# Patient Record
Sex: Female | Born: 1940 | Race: Black or African American | Hispanic: No | Marital: Single | Smoking: Never smoker
Health system: Southern US, Community
[De-identification: ages and names within clinical notes are randomized; demographics above are authoritative.]

## PROBLEM LIST (undated history)

## (undated) DIAGNOSIS — E119 Type 2 diabetes mellitus without complications: Secondary | ICD-10-CM

## (undated) DIAGNOSIS — C50919 Malignant neoplasm of unspecified site of unspecified female breast: Secondary | ICD-10-CM

## (undated) DIAGNOSIS — N189 Chronic kidney disease, unspecified: Secondary | ICD-10-CM

## (undated) DIAGNOSIS — I10 Essential (primary) hypertension: Secondary | ICD-10-CM

## (undated) DIAGNOSIS — M810 Age-related osteoporosis without current pathological fracture: Secondary | ICD-10-CM

## (undated) DIAGNOSIS — E039 Hypothyroidism, unspecified: Secondary | ICD-10-CM

## (undated) DIAGNOSIS — F329 Major depressive disorder, single episode, unspecified: Secondary | ICD-10-CM

## (undated) DIAGNOSIS — F32A Depression, unspecified: Secondary | ICD-10-CM

## (undated) DIAGNOSIS — E7800 Pure hypercholesterolemia, unspecified: Secondary | ICD-10-CM

## (undated) DIAGNOSIS — F0390 Unspecified dementia without behavioral disturbance: Secondary | ICD-10-CM

## (undated) HISTORY — PX: BREAST SURGERY: SHX581

## (undated) HISTORY — PX: THYROIDECTOMY: SHX17

---

## 1971-08-19 HISTORY — PX: MASTECTOMY: SHX3

## 2011-08-19 HISTORY — PX: BREAST BIOPSY: SHX20

## 2014-07-03 ENCOUNTER — Emergency Department: Payer: Self-pay | Admitting: Emergency Medicine

## 2014-07-05 ENCOUNTER — Emergency Department: Payer: Self-pay | Admitting: Student

## 2014-11-09 ENCOUNTER — Observation Stay: Payer: Self-pay | Admitting: Internal Medicine

## 2014-11-09 LAB — URINALYSIS, COMPLETE
BLOOD: NEGATIVE
Bilirubin,UR: NEGATIVE
Glucose,UR: NEGATIVE mg/dL (ref 0–75)
Hyaline Cast: 62
KETONE: NEGATIVE
Nitrite: NEGATIVE
PH: 6 (ref 4.5–8.0)
Protein: NEGATIVE
RBC,UR: 3 /HPF (ref 0–5)
Specific Gravity: 1.013 (ref 1.003–1.030)
Squamous Epithelial: 40
WBC UR: 13 /HPF (ref 0–5)

## 2014-11-09 LAB — CBC WITH DIFFERENTIAL/PLATELET
BASOS ABS: 0.1 10*3/uL (ref 0.0–0.1)
BASOS PCT: 1.1 %
Basophil #: 0.1 10*3/uL (ref 0.0–0.1)
Basophil %: 1.3 %
EOS PCT: 1.5 %
EOS PCT: 1.9 %
Eosinophil #: 0.1 10*3/uL (ref 0.0–0.7)
Eosinophil #: 0.2 10*3/uL (ref 0.0–0.7)
HCT: 40.3 % (ref 35.0–47.0)
HCT: 43.8 % (ref 35.0–47.0)
HGB: 12.7 g/dL (ref 12.0–16.0)
HGB: 13.8 g/dL (ref 12.0–16.0)
LYMPHS ABS: 2.8 10*3/uL (ref 1.0–3.6)
Lymphocyte #: 1.9 10*3/uL (ref 1.0–3.6)
Lymphocyte %: 27.3 %
Lymphocyte %: 30.9 %
MCH: 27.3 pg (ref 26.0–34.0)
MCH: 27.6 pg (ref 26.0–34.0)
MCHC: 31.5 g/dL — AB (ref 32.0–36.0)
MCHC: 31.5 g/dL — ABNORMAL LOW (ref 32.0–36.0)
MCV: 87 fL (ref 80–100)
MCV: 88 fL (ref 80–100)
MONOS PCT: 8.2 %
Monocyte #: 0.6 x10 3/mm (ref 0.2–0.9)
Monocyte #: 0.9 x10 3/mm (ref 0.2–0.9)
Monocyte %: 10.2 %
NEUTROS ABS: 4.2 10*3/uL (ref 1.4–6.5)
Neutrophil #: 5.1 10*3/uL (ref 1.4–6.5)
Neutrophil %: 55.9 %
Neutrophil %: 61.7 %
PLATELETS: 233 10*3/uL (ref 150–440)
Platelet: 203 10*3/uL (ref 150–440)
RBC: 4.61 10*6/uL (ref 3.80–5.20)
RBC: 5.06 10*6/uL (ref 3.80–5.20)
RDW: 14 % (ref 11.5–14.5)
RDW: 14.2 % (ref 11.5–14.5)
WBC: 6.8 10*3/uL (ref 3.6–11.0)
WBC: 9.1 10*3/uL (ref 3.6–11.0)

## 2014-11-09 LAB — HEMOGLOBIN: HGB: 12.9 g/dL (ref 12.0–16.0)

## 2014-11-09 LAB — COMPREHENSIVE METABOLIC PANEL
ALBUMIN: 3.7 g/dL
ALK PHOS: 105 U/L
ANION GAP: 11 (ref 7–16)
AST: 26 U/L
BUN: 18 mg/dL
Bilirubin,Total: 0.8 mg/dL
CALCIUM: 9.2 mg/dL
Chloride: 101 mmol/L
Co2: 26 mmol/L
Creatinine: 1.17 mg/dL — ABNORMAL HIGH
GFR CALC AF AMER: 54 — AB
GFR CALC NON AF AMER: 47 — AB
Glucose: 203 mg/dL — ABNORMAL HIGH
Potassium: 2.8 mmol/L — ABNORMAL LOW
SGPT (ALT): 18 U/L
SODIUM: 138 mmol/L
Total Protein: 7.1 g/dL

## 2014-11-09 LAB — TROPONIN I
Troponin-I: <0.03 ng/mL
Troponin-I: <0.03 ng/mL
Troponin-I: <0.03 ng/mL

## 2014-11-09 LAB — BASIC METABOLIC PANEL
ANION GAP: 8 (ref 7–16)
BUN: 12 mg/dL
CALCIUM: 8.5 mg/dL — AB
CO2: 26 mmol/L
Chloride: 107 mmol/L
Creatinine: 0.87 mg/dL
EGFR (African American): >60
Glucose: 129 mg/dL — ABNORMAL HIGH
Potassium: 3.5 mmol/L
Sodium: 141 mmol/L

## 2014-11-09 LAB — MAGNESIUM: Magnesium: 1.8 mg/dL

## 2014-11-09 LAB — PROTIME-INR
INR: 1
Prothrombin Time: 13.7 secs

## 2014-11-09 LAB — HEMATOCRIT: HCT: 41.3 % (ref 35.0–47.0)

## 2014-11-09 LAB — LIPASE, BLOOD: LIPASE: 22 U/L

## 2014-11-10 LAB — CBC WITH DIFFERENTIAL/PLATELET
Basophil #: 0 10*3/uL (ref 0.0–0.1)
Basophil %: 0.4 %
Eosinophil #: 0.1 10*3/uL (ref 0.0–0.7)
Eosinophil %: 3 %
HCT: 39.8 % (ref 35.0–47.0)
HGB: 12.5 g/dL (ref 12.0–16.0)
Lymphocyte #: 2.3 10*3/uL (ref 1.0–3.6)
Lymphocyte %: 47.1 %
MCH: 27.7 pg (ref 26.0–34.0)
MCHC: 31.5 g/dL — ABNORMAL LOW (ref 32.0–36.0)
MCV: 88 fL (ref 80–100)
Monocyte #: 0.7 x10 3/mm (ref 0.2–0.9)
Monocyte %: 13.7 %
Neutrophil #: 1.8 10*3/uL (ref 1.4–6.5)
Neutrophil %: 35.8 %
Platelet: 203 10*3/uL (ref 150–440)
RBC: 4.53 10*6/uL (ref 3.80–5.20)
RDW: 13.8 % (ref 11.5–14.5)
WBC: 4.9 10*3/uL (ref 3.6–11.0)

## 2014-11-10 LAB — BASIC METABOLIC PANEL
Anion Gap: 7 (ref 7–16)
BUN: 8 mg/dL
Calcium, Total: 8.6 mg/dL — ABNORMAL LOW
Chloride: 107 mmol/L
Co2: 26 mmol/L
Creatinine: 0.8 mg/dL
EGFR (African American): >60
EGFR (Non-African Amer.): >60
Glucose: 131 mg/dL — ABNORMAL HIGH
Potassium: 3.5 mmol/L
Sodium: 140 mmol/L

## 2014-11-10 LAB — HEMATOCRIT
HCT: 40.8 % (ref 35.0–47.0)
HCT: 40.5 %

## 2014-11-10 LAB — HEMOGLOBIN A1C: Hemoglobin A1C: 6.6 % — ABNORMAL HIGH

## 2014-11-10 LAB — TSH: Thyroid Stimulating Horm: 2.562 u[IU]/mL

## 2014-11-10 LAB — HEMOGLOBIN
HGB: 12.9 g/dL (ref 12.0–16.0)
HGB: 12.9 g/dL (ref 12.0–16.0)

## 2014-11-10 LAB — MAGNESIUM: Magnesium: 1.8 mg/dL

## 2014-11-11 LAB — CBC WITH DIFFERENTIAL/PLATELET
Basophil #: 0 10*3/uL (ref 0.0–0.1)
Basophil %: 0.6 %
Eosinophil #: 0.2 10*3/uL (ref 0.0–0.7)
Eosinophil %: 2.9 %
HCT: 42.2 % (ref 35.0–47.0)
HGB: 13.4 g/dL (ref 12.0–16.0)
LYMPHS PCT: 40.9 %
Lymphocyte #: 2.3 10*3/uL (ref 1.0–3.6)
MCH: 27.5 pg (ref 26.0–34.0)
MCHC: 31.7 g/dL — ABNORMAL LOW (ref 32.0–36.0)
MCV: 87 fL (ref 80–100)
MONOS PCT: 12.1 %
Monocyte #: 0.7 x10 3/mm (ref 0.2–0.9)
NEUTROS ABS: 2.5 10*3/uL (ref 1.4–6.5)
Neutrophil %: 43.5 %
Platelet: 229 10*3/uL (ref 150–440)
RBC: 4.87 10*6/uL (ref 3.80–5.20)
RDW: 13.6 % (ref 11.5–14.5)
WBC: 5.6 10*3/uL (ref 3.6–11.0)

## 2014-11-11 LAB — URINE CULTURE

## 2014-12-17 NOTE — Consult Note (Signed)
PATIENT NAME:  Melanie Young, WALKO MR#:  [id number] DATE OF BIRTH:  1941-02-24  DATE OF CONSULTATION:  11/09/2014  REFERRING PHYSICIAN:   CONSULTING PHYSICIAN:  Theodore Demark, NP  REASON FOR CONSULTATION: GI consult ordered by Dr. Marcille Blanco for pain with bright red blood per rectum with syncope.   HISTORY OF PRESENT ILLNESS: I appreciate consult for this 74 year old [place] American woman for evaluation of BRPR with syncope. History is significant for colon polyps. States last colonoscopy was a month ago, breast cancer with mastectomy, diabetes, hypertension, hyperlipidemia and angina.  Reports she was in her usual state of health until last night, felt the urge to defecate, associated with nausea and an unusual feeling in her lower abdomen; went to the commode and became dizzy and hot and then evidently passed out. The patient reports her son said there was some bright red blood in and around the toilet in small amounts. She states she has had 2 episodes of bright red blood on the toilet paper today in small amounts, but none in the actual commode.  She states that nausea is resolved.  She is feeling very well in her abdomen. Does report a history of constipation and straining, but denies all other GI complaints. Hemoglobin is normal and has not dropped.  She is dynamically stable, reports her last colonoscopy was done a month ago at [facility] and there was some benign polyps removed.  REVIEW OF SYSTEMS:   Ten systems reviewed. Significant for intermittent angina, dizzy spells, loss of balance, falls, urinary frequency. Otherwise unremarkable as noted above.   PAST MEDICAL HISTORY: B-12 deficiency, type 2 diabetes, hypothyroidism, hypertension, osteopenia, breast cancer, right mastectomy, thyroidectomy, colonoscopy.   SOCIAL HISTORY: Lives with older son, no alcohol, illicits or tobacco.   FAMILY HISTORY: Maternal aunt and 3 cousins with breast cancer, son who deceased at age 55 from CAD-heart disease.  There is no family history of colorectal cancer, colon polyps, liver disease or ulcers.   HOME MEDICATIONS: ASA 81 mg p.o. daily, Wellbutrin 150 mg ER b.i.d., calcium with D b.i.d., Lasix 40 mg once a day, Lantus 4 units at bedtime, levothyroxine 125 mg 1 tablets p.o. daily, losartan 50 mg once a day, B12 at 1000 mcg once a day.   ALLERGIES: NKDA.   DIAGNOSTIC DATA:  CT of abdomen and pelvis with no acute abnormalities.  There was some diverticulosis but no diverticulitis, no bowel wall inflammation.  CT head had no acute intracranial abnormalities.   LABORATORY DATA: Most recent labs: Glucose 129, BUN 12, creatinine 0.87, sodium 141, potassium 3.5, GFR greater than 60, calcium 8.5, lipase 22, magnesium 1.8. Liver panel unremarkable. Troponin x 3 negative.  WBC 9.1, hemoglobin 12.7, hematocrit 40.3, platelets 203,000, red cells are normocytic.  PT 13.7, INR 1.0.  Imaging studies as above.    PHYSICAL EXAMINATION:   VITAL SIGNS:  Temperature 98.1, pulse 68, respiratory rate 20, blood pressure 103/74, oxygen saturation 97% on room air.  GENERAL: Well-appearing elderly woman in no acute distress.  HEENT: Normocephalic, atraumatic. Conjunctivae pink. Sclerae clear.  NECK: No JVD, lymphadenopathy.  CHEST: Respirations eupneic.  LUNGS:  Clear.  CARDIAC: S1, S2, RRR. No MRG. No appreciable edema.  ABDOMEN: Obese abdomen. Bowel sounds x 4. Soft, nondistended, nontender. No guarding, rigidity, peritoneal signs, hepatosplenomegaly or masses.  RECTAL: Several external hemorrhoids on the anal ring. There is a small oblong mass inside the dentate line consistent with an internal hemorrhoid. There is also a rough linear area at approximately 12 o'clock  that feels consistent with a fissure. Her stool is brownish, pink, heme-positive.  SKIN: Warm, dry, pink. No erythema, lesion or rash.  EXTREMITIES: MAEW x 4. Strength 5/5. No clubbing or cyanosis.  NEUROLOGIC: Alert, oriented x 3. Cranial nerves II through  XII intact.  PSYCHIATRIC: Pleasant, calm, cooperative. Good memory mostly, good judgment.   IMPRESSION AND PLAN: Rectal bleeding. Do not think bleeding precipitated syncope, somewhat unusual presentation for anal outlet, but also was not consistent with ischemic episode colitis. Doubt diverticular bleed.  We would recommend treating with Anusol suppositories b.i.d. x 2 weeks, Citrucel once a day due to her history of constipation and straining, and consider flexible sigmoidoscopy if significant bleeding episode.  Would like her colonoscopy records.   Thank you very much for this consult. These services were provided by Stephens November, MSN, [facility], in collaboration with Loistine Simas, M.D. with whom I discussed this patient in full.     ____________________________ Theodore Demark, NP chl:DT D: 11/10/2014 12:12:45 ET T: 11/10/2014 12:28:52 ET JOB#: [id number]  cc: Theodore Demark, NP, <Dictator> [facility] SIGNED 11/17/2014 16:07

## 2014-12-17 NOTE — Consult Note (Signed)
Brief Consult Note: Diagnosis: syncope/brpr.   Patient was seen by consultant.   Consult note dictated.   Comments: Appreciate consult for 74 y/o [place] Amercian woman for evaluation of brpr with syncope. Hx significant for colon polyps (last cspy 31m ago), breast cancer w/mastectomy, DM, htn, HL, angina. Reports she was in her usual states of health until late last night- felt the urge to defecate, associated with nausea and an unsual feeling in her lower abdomen. Went to the commode, became dizzy and hot then evidently passed out. Pt reports her son said there was some bright red blood in/around the toilet in small amt. States she has had 2 episodes of bright red blood on the toilet paper today in small amounts, but none in the toilet.States nausea resolved, and she feels very well in his abdomen. Does report a history of constipation and straining, but denies all other GI complaints. Her hgb is normal and has not dropped since arrival. She is hemodynamically stable. Colonoscopy last done at [facility] ago and states there were some benign polyps removed. On exam, abdomen benign, soft with good bowel sounds. Rectal exam with external hemorrhoids, internal oblong soft area consistent with internal hemorrhoidi, and linear rough area about 12o clock consitent with a fissure. Stool brownish pink , heme pos Impression/plan: Rectal bleeding. Do not think bleeding precipitated syncope. Somewhat unusual presentation for anal outlet, but not consistent with ischemic episode/colitis. Doubt diveritcular. Would recommend treateing with anusol suppositories bid x 2w, citrucel once daily due to her hsitory of constipation/straining, and consider flex sig if significant bleeding episode. Would like cspy records.  Electronic Signatures: Stephens November H (NP)  (Signed 24-Mar-16 19:48)  Authored: Brief Consult Note   Last Updated: 24-Mar-16 19:48 by Theodore Demark (NP)

## 2014-12-17 NOTE — Consult Note (Signed)
Chief Complaint:  Subjective/Chief Complaint The patient has had no further bleeding. Had a colonoscopy last month at [facility]. No abd. pain.   VITAL SIGNS/ANCILLARY NOTES: **Vital Signs.:   25-Mar-16 12:31  Vital Signs Type Recheck  Systolic BP Systolic BP 007  Diastolic BP (mmHg) Diastolic BP (mmHg) 622  Mean BP 133  BP Source  if not from Vital Sign Device manual   Brief Assessment:  GEN well developed, well nourished, no acute distress   Respiratory normal resp effort  no use of accessory muscles   Additional Physical Exam Alert and orientated times 3   Lab Results: Thyroid:  25-Mar-16 04:39   Thyroid Stimulating Hormone 2.562 (0.350-4.500 NOTE: New Reference Range  10/24/14)  Routine Chem:  25-Mar-16 04:39   Magnesium, Serum 1.8 (1.7-2.4 THERAPEUTIC RANGE: 4-7 mg/dL TOXIC: > 10 mg/dL  ----------------------- NOTE: New Reference Range  10/24/14)  Glucose, Serum  131 (65-99 NOTE: New Reference Range  10/24/14)  BUN 8 (6-20 NOTE: New Reference Range  10/24/14)  Creatinine (comp) 0.80 (0.44-1.00 NOTE: New Reference Range  10/24/14)  Sodium, Serum 140 (135-145 NOTE: New Reference Range  10/24/14)  Potassium, Serum 3.5 (3.5-5.1 NOTE: New Reference Range  10/24/14)  Chloride, Serum 107 (101-111 NOTE: New Reference Range  10/24/14)  CO2, Serum 26 (22-32 NOTE: New Reference Range  10/24/14)  Calcium (Total), Serum  8.6 (8.9-10.3 NOTE: New Reference Range  10/24/14)  Anion Gap 7  eGFR (African American) >60  eGFR (Non-African American) >60 (eGFR values <70m/min/1.73 m2 may be an indication of chronic kidney disease (CKD). Calculated eGFR is useful in patients with stable renal function. The eGFR calculation will not be reliable in acutely ill patients when serum creatinine is changing rapidly. It is not useful in patients on dialysis. The eGFR calculation may not be applicable to patients at the low and high extremes of body sizes, pregnant women, and  vegetarians.)  Hemoglobin A1c ([facility])  6.6 (4.0-6.0 NOTE: New Reference Range  10/24/14)  Routine Hem:  25-Mar-16 01:15   Hemoglobin (CBC) 12.9 (Result(s) reported on 10 Nov 2014 at 01:30AM.)  Hematocrit (CBC) 40.8 (Result(s) reported on 10 Nov 2014 at 01:30AM.)    04:39   Hemoglobin (CBC) 12.5  Hematocrit (CBC) 39.8  WBC (CBC) 4.9  RBC (CBC) 4.53  Platelet Count (CBC) 203  MCV 88  MCH 27.7  MCHC  31.5  RDW 13.8  Neutrophil % 35.8  Lymphocyte % 47.1  Monocyte % 13.7  Eosinophil % 3.0  Basophil % 0.4  Neutrophil # 1.8  Lymphocyte # 2.3  Monocyte # 0.7  Eosinophil # 0.1  Basophil # 0.0 (Result(s) reported on 10 Nov 2014 at 06:38AM.)    08:10   Hemoglobin (CBC) 12.9 (Result(s) reported on 10 Nov 2014 at 08:32AM.)  Hematocrit (CBC) 40.5 (Result(s) reported on 10 Nov 2014 at 08:32AM.)   Assessment/Plan:  Assessment/Plan:  Assessment Lower GI bleeding.   Plan No bleeding today. Follow Hb and symptoms for furhter GI bleeding.   Electronic Signatures: WLucilla Lame(MD)  (Signed 25-Mar-16 15:27)  Authored: Chief Complaint, VITAL SIGNS/ANCILLARY NOTES, Brief Assessment, Lab Results, Assessment/Plan   Last Updated: 25-Mar-16 15:27 by WLucilla Lame(MD)

## 2014-12-17 NOTE — Consult Note (Signed)
Chief Complaint:  Subjective/Chief Complaint patietn seen for rectal bleeding.   Acute onset yesterday, some lower abdominal discomfort at that time.   Rectal exam this afternoon with scant effluent, not grossly bloddy though heme positive, finding of hemorrhoids.  Patient had colonoscopy one month ago at [facility] with apparent finding of colon polyps.  Positive h/o intermittant constipation, occasion rectal bleeding/on toilet paper.   HGB stable.  Hemodynamically stable.  Probable hemorrhoidal bleeding or anal fissure.  Less likely diverticular (smaller amount and no gross blood in vault currently) or ischemia (no abdominal pain at current). Recommend treating for hemorrhoidal bleeding with HC rectal cream.  If bleeding is recurrent, would consider flexible sigmoidoscopy.    Dr Allen Norris covering over the weekend, call if needed.   VITAL SIGNS/ANCILLARY NOTES: **Vital Signs.:   24-Mar-16 20:22  Vital Signs Type Routine  Temperature Temperature (F) 98.1  Celsius 36.7  Pulse Pulse 74  Respirations Respirations 18  Systolic BP Systolic BP 419  Diastolic BP (mmHg) Diastolic BP (mmHg) 86  Mean BP 106  Pulse Ox % Pulse Ox % 97  Pulse Ox Activity Level  At rest  Oxygen Delivery Room Air/ 21 %   Brief Assessment:  Cardiac Regular   Respiratory clear BS   Gastrointestinal details normal Soft  Nontender  Nondistended  Bowel sounds normal   Lab Results:  Routine UA:  24-Mar-16 23:57   Color (UA) Yellow  Clarity (UA) Cloudy  Glucose (UA) Negative  Bilirubin (UA) Negative  Ketones (UA) Negative  Specific Gravity (UA) 1.013  Blood (UA) Negative  pH (UA) 6.0  Protein (UA) Negative  Nitrite (UA) Negative  Leukocyte Esterase (UA) 3+ (Result(s) reported on 09 Nov 2014 at 12:58AM.)  RBC (UA) 3 /HPF  WBC (UA) 13 /HPF  Bacteria (UA) 1+  Epithelial Cells (UA) 40 /HPF  Hyaline Cast (UA) 62 /LPF (Result(s) reported on 09 Nov 2014 at 12:58AM.)  Routine Hem:  23-Mar-16 23:57   Hemoglobin (CBC) 13.8   24-Mar-16 05:38   Hemoglobin (CBC) 12.7    15:52   Hemoglobin (CBC) 12.9 (Result(s) reported on 09 Nov 2014 at 04:28PM.)   Radiology Results: CT:    24-Mar-16 02:00, CT Abdomen and Pelvis With Contrast  CT Abdomen and Pelvis With Contrast   REASON FOR EXAM:    (1) painless GI bleed; (2) painless GI bleed  COMMENTS:       PROCEDURE: CT  - CT ABDOMEN / PELVIS  W  - Nov 09 2014  2:00AM     CLINICAL DATA:  Acute onset of syncope and fall. Diaphoresis and  lightheadedness. Initial encounter.    EXAM:  CT ABDOMEN AND PELVIS WITH CONTRAST    TECHNIQUE:  Multidetector CT imaging of the abdomen and pelvis was performed  using the standard protocol following bolus administration of  intravenous contrast.  CONTRAST:  100 mL of Omnipaque 300 IVcontrast    COMPARISON:  None.    FINDINGS:  Mild bronchiectasis is noted at the lung bases, with minimal  underlying scarring.    The liver and spleen are unremarkable in appearance. Scattered  stones are seen dependently within the gallbladder. Thegallbladder  is relatively decompressed and otherwise unremarkable. The pancreas  and adrenal glands are unremarkable.    The kidneys are unremarkable in appearance. There is no evidence of  hydronephrosis. No renal or ureteral stones are seen. No perinephric  stranding is appreciated.    No free fluid is identified. The small bowel is unremarkable in  appearance. The  stomach is within normal limits. No acute vascular  abnormalities are seen. Scattered calcification is seen along the  abdominal aorta and its branches.    The appendix is normal in caliber and contains air, without evidence  of appendicitis. Scattered diverticulosis is noted along the distal  descending and proximal sigmoid colon, without evidence of  diverticulitis.    The bladder is moderately distended and grossly unremarkable. The  uterus is unremarkable in appearance. The ovaries are relatively  symmetric. No suspicious  adnexal masses are seen. No inguinal  lymphadenopathy is seen.    No acute osseous abnormalities areidentified. Multilevel vacuum  phenomenon is noted along the lumbar spine, with associated endplate  sclerotic change.     IMPRESSION:  1. No acute abnormality seen to explain the patient's symptoms.  2. Cholelithiasis; gallbladder otherwise unremarkable.  3. Scattered calcification along the abdominal aorta and its  branches.  4. Scattered diverticulosis along the distal descending and proximal  sigmoid colon, without evidence of diverticulitis.  5. Mild degenerative change along the lumbar spine.  6. Mild bronchiectasis at the lung bases, with minimal underlying  scarring.      Electronically Signed    By: Garald Balding M.D.    On: 11/09/2014 02:59         Verified By: JEFFREY . Radene Knee, M.D.,   Assessment/Plan:  Assessment/Plan:  Assessment as above   Electronic Signatures: Loistine Simas (MD)  (Signed 24-Mar-16 23:25)  Authored: Chief Complaint, VITAL SIGNS/ANCILLARY NOTES, Brief Assessment, Lab Results, Radiology Results, Assessment/Plan   Last Updated: 24-Mar-16 23:25 by Loistine Simas (MD)

## 2014-12-17 NOTE — H&P (Signed)
PATIENT NAME:  Melanie Young, STARLIN MR#:  [id number] DATE OF BIRTH:  22-Apr-1941  DATE OF ADMISSION:  11/09/2014  REFERRING PHYSICIAN: Valli Glance. Owens Shark, MD   PRIMARY CARE PHYSICIAN: [facility] physicians.   ADMISSION DIAGNOSIS: Bright red bleeding per rectum and syncope.   HISTORY OF PRESENT ILLNESS: This is a 74 year old African American female who presents to the Emergency Department via EMS following an episode of syncope and bright red bleeding per rectum. The patient recognizes that she passed out with while having a bowel movement. The patient fell off the commode, but did not hit her head. She denies any pain of her skull. In the Emergency Department, the patient's head CT was negative and her hemoglobin and hematocrit were within normal limits. She states that she recalls feeling nauseous and became very diaphoretic prior to passing out. She denies that this has happened before; however, she admits that she has been falling frequently over the last year. These falls are generally due to lack of balance, as the patient states there is no prodrome or loss of consciousness prior to falling. She states that she feels as if she is staggering prior to falling, but she is able to catch herself before sustaining any injury. Due to her constellation of symptoms, the Emergency Department called for admission.   REVIEW OF SYSTEMS:  CONSTITUTIONAL: The patient denies fever or weakness.  EYES: Denies blurred vision, but admits to dry eyes as well as mucus production from both eyes.  EARS, NOSE AND THROAT: Denies tinnitus or sore throat.  RESPIRATORY: Denies shortness of breath or cough.  CARDIOVASCULAR: Denies chest pain, palpitations, orthopnea, or paroxysmal nocturnal dyspnea.  GASTROINTESTINAL: Admits to nausea that is currently resolved, but denies vomiting, diarrhea, or abdominal pain.  GENITOURINARY: The patient denies dysuria, but admits to increased frequency, hesitancy and urgency of urination.   ENDOCRINE: Denies polydipsia, but admits to polyuria.  HEMATOLOGIC AND LYMPHATIC: Denies easy bruising or bleeding.  INTEGUMENTARY: Denies rashes or lesions.  MUSCULOSKELETAL: Denies myalgias, but admits to arthralgias. This is a chronic complaint.  NEUROLOGIC: The patient states that she feels as if she is falling to one side prior to these episodes of imbalance. She does not fall one side all of the time, but can fall in any direction. She denies headaches or dysarthria associated with these symptoms.  PSYCHIATRIC: The patient has a history of depression, but is not suicidal or homicidal at this time.   PAST MEDICAL HISTORY: As reported by the patient, she has B12 deficiency; diabetes, type 2; hypothyroidism; hypertension; and osteopenia or some related calcium metabolism disorder, history of breast cancer.    PAST SURGICAL HISTORY: Right mastectomy, thyroidectomy, colposcopy with excision.   SOCIAL HISTORY: The patient lives with her older son. She does not smoke, drink, or do any drugs.   FAMILY HISTORY: The patient has a son that died of coronary artery disease at 72 years of age. She also has had a maternal aunt as well as 3 first cousins, all of whom are sisters, who had breast cancer.   MEDICATIONS:  1.  Aspirin 81 mg 1 tablet p.o. daily.  2.  Bupropion 150 mg extended release 1 tablet p.o. b.i.d.  3.  Calcium 600 + 800 international units of vitamin D 1 tablet p.o. b.i.d.  4.  Furosemide 40 mg 1 tablet p.o. daily.  5.  Lantus 4 units subcutaneously at bedtime.  6.  Levothyroxine 125 mcg 1 tablet p.o. daily.  7.  Losartan 50 mg 1  tablet p.o. daily.  8.  Vitamin B12 at 1000 mcg 1 tablet p.o. daily.   ALLERGIES: No known drug allergies.   PERTINENT LABORATORY RESULTS AND RADIOGRAPHIC FINDINGS: Serum glucose is 203, BUN 18, creatinine 1.17, serum sodium is 138, potassium is 2.8, chloride is 101, bicarbonate 26, calcium is 9.2. Lipase is 22, serum albumin is 3.7, alkaline phosphatase  105, AST 26, ALT 18. White blood cell count 6.8, hemoglobin is 13.8, hematocrit is 43.8, platelet count 233,000, MCV is 87. Urinalysis shows 3+ leukocyte esterase with 3 red blood cells per high-power field and 13 white blood cells per high-power field. There is 1+ bacteria seen. CT of the head without contrast shows no acute intracranial abnormalities. CT of the abdomen and pelvis with contrast shows no acute abnormality. There is some cholelithiasis as well as some scattered calcification along the abdominal aorta. There is some scattered diverticulosis along the distal descending and proximal sigmoid colon without evidence of diverticulitis. There is mild degenerative change along the lumbar spine and mild bronchiectasis at the lung bases with minimal underlying scarring.   PHYSICAL EXAMINATION:  VITAL SIGNS: Temperature is 98.2, pulse 69, respirations 16, blood pressure is 113/68, pulse oximetry is 100% on room air.  GENERAL: The patient is alert and oriented x 3, in no apparent distress.  HEENT: Normocephalic, atraumatic. Pupils equal, round, and reactive to light and accommodation. Extraocular movements are intact. Mucous membranes are moist. The sclerae of the patient's eyes are inflamed and deep inner canthi filled with serous green mucus.  NECK: Trachea is midline. No adenopathy. There is a well-healed scar from thyroidectomy.  CHEST: Symmetric. There is also a well-healed mastectomy scar on the right.  CARDIOVASCULAR: Regular rate and rhythm. Normal S1, S2. No rubs, clicks, or murmurs appreciated.  LUNGS: Clear to auscultation bilaterally. Normal effort and excursion.  ABDOMEN: Positive bowel sounds. Soft, nontender, nondistended. No hepatosplenomegaly.  GENITOURINARY: Deferred.  MUSCULOSKELETAL: The patient moves all 4 extremities equally. There is 5/5 strength in upper and lower extremities bilaterally.  SKIN: Warm and dry. There are no rashes or lesions.  EXTREMITIES: No clubbing or  cyanosis. There is trace pretibial edema.  NEUROLOGIC: Cranial nerves II-XII are grossly intact. There is no nystagmus.   PSYCHIATRIC: Mood is normal. Affect is congruent. The patient has excellent judgment and insight into her medical condition, although her memory of specific procedures is not exactly clear.   ASSESSMENT AND PLAN: This is a 74 year old female admitted for bright red bleeding per rectum and syncope.  1.  Bright red bleeding per rectum. The patient's bleeding has been painless. She has no diverticulitis seen on CAT scan. She is hemodynamically stable, as she is not orthostatic at this time. Her hemoglobin and hematocrit are within normal limits, although we will recheck them in the morning to verify that this is accurate and not a result obtained prior to equilibration of her blood volume. The patient notes that she has had colonoscopies on a frequent schedule. Her last colonoscopy was 1 month ago. The findings of each of these procedures is not completely clear. Differential diagnosis includes internal hemorrhoids as well as hamartomas or diseases such as Peutz Jeghers syndrome, which could lead to some vitamin deficiencies as well. I have consulted gastroenterology and made the patient n.p.o. I have also held her aspirin.  2.  Syncope. This is likely vasovagal given the circumstances surrounding the episode. The patient's head CT is negative. I have placed her on telemetry. We will track her cardiac enzymes  due to the associated symptoms of nausea and diaphoresis; however, I think that acute coronary syndrome is unlikely.  3.  Urinary tract infection. The patient is symptomatic. We will start ceftriaxone.  4.  Acute kidney injury. The patient's baseline is unknown. I will gently hydrate her and replete her potassium.  5.  Diabetes, type 2. The patient report reports high insulin sensitivity. She is only on 4 units of Lantus at home. Now that the patient is n.p.o., I will hold her insulin  for now but we may restart sliding scale insulin at a low dose once the patient has been placed on a diet.  6.  Hypertension, currently controlled. We can continue losartan, although we may need to hold it prior to the procedure tomorrow morning.  7.  Hypothyroidism. I will check a thyroid stimulating hormone and continue Synthroid.  8.  Osteopenia. The patient is unclear on the specifics of her disease process, as she states that it is mostly in her lower legs, which is not common for osteoporosis, but certainly a possible finding on a bone scan. I am going to check vitamin D and intact PTH and calcium levels.  9.  Deep vein thrombosis prophylaxis. Sequential compression devices.  10.  Gastrointestinal prophylaxis. Intravenous pantoprazole.   CODE STATUS: The patient is a full code.   TIME SPENT ON ADMISSION ORDERS AND PATIENT CARE: Approximately 45 minutes.    ____________________________ Norva Riffle. Marcille Blanco, MD msd:bm D: 11/09/2014 05:05:26 ET T: 11/09/2014 05:46:10 ET JOB#: [id number]  cc: Norva Riffle. Marcille Blanco, MD, <Dictator> Norva Riffle Obryan Radu MD ELECTRONICALLY SIGNED 11/10/2014 0:39

## 2014-12-17 NOTE — Consult Note (Signed)
Chief Complaint:  Subjective/Chief Complaint Patient without any further bleeding. Hb stable. Recent colonoscopy at [facility].   VITAL SIGNS/ANCILLARY NOTES: **Vital Signs.:   26-Mar-16 07:48  Vital Signs Type Routine  Temperature Temperature (F) 97.3  Celsius 36.2  Temperature Source oral  Pulse Pulse 60  Respirations Respirations 18  Systolic BP Systolic BP 174  Diastolic BP (mmHg) Diastolic BP (mmHg) 84  Mean BP 100  Pulse Ox % Pulse Ox % 97  Pulse Ox Activity Level  At rest  Oxygen Delivery Room Air/ 21 %  Telemetry pattern Cardiac Rhythm Bradycardia   Brief Assessment:  GEN well developed, well nourished, no acute distress   Respiratory normal resp effort  no use of accessory muscles   Additional Physical Exam Alert and orientated times 3   Lab Results: Routine Hem:  26-Mar-16 03:25   WBC (CBC) 5.6  RBC (CBC) 4.87  Hemoglobin (CBC) 13.4  Hematocrit (CBC) 42.2  Platelet Count (CBC) 229  MCV 87  MCH 27.5  MCHC  31.7  RDW 13.6  Neutrophil % 43.5  Lymphocyte % 40.9  Monocyte % 12.1  Eosinophil % 2.9  Basophil % 0.6  Neutrophil # 2.5  Lymphocyte # 2.3  Monocyte # 0.7  Eosinophil # 0.2  Basophil # 0.0 (Result(s) reported on 11 Nov 2014 at 04:46AM.)   Assessment/Plan:  Assessment/Plan:  Assessment Lower GI bleed likely Hemorrhoids.   Plan Nothing further to do from a GI point of view.  Will sign off now.  If rebleeding then surgery should be consulted.   Electronic Signatures: Lucilla Lame (MD)  (Signed 26-Mar-16 10:18)  Authored: Chief Complaint, VITAL SIGNS/ANCILLARY NOTES, Brief Assessment, Lab Results, Assessment/Plan   Last Updated: 26-Mar-16 10:18 by Lucilla Lame (MD)

## 2014-12-17 NOTE — Discharge Summary (Signed)
PATIENT NAME:  Melanie Young, Melanie Young MR#:  [id number] DATE OF BIRTH:  [birth year]  DATE OF ADMISSION:  11/09/2014 DATE OF DISCHARGE:  11/11/2014  DISCHARGE DIAGNOSES:  1.  Lower gastrointestinal bleed secondary to hemorrhoids.  2.  Syncope.  3.  Acute kidney injury.   PROCEDURES: None.   CONSULTATIONS: Gastroenterology.   CODE STATUS: Full code.   DISCHARGE MEDICATIONS: Lantus 40 units subcutaneously once daily at bedtime, bupropion 150 mg 1 tablet p.o. 2 times a day, aspirin 81 mg 1 tablet p.o. once daily, levothyroxine 125 mcg p.o. once daily in the a.m., vitamin B12 at 1000 mcg p.o. once daily, furosemide 40 mg p.o. once daily, calcium with vitamin D 1 tablet p.o. 2 times a day, losartan 50 mg p.o. 2 times a day, metoprolol tartrate 25 mg 1 tablet p.o. 2 times a day, hydrocortisone rectal cream applied rectally 2 times a day at needed for constipation or hemorrhoids.   DIET: Low-sodium, low-fat, carbohydrate control ADA (diet consistency regular consistency).   ACTIVITY: As tolerated.   FOLLOWUP APPOINTMENTS: With primary care physician in 1 week, GI  in 2 to 4 weeks as-needed basis if a GI bleed recurs.   BRIEF HISTORY AND PHYSICAL AND HOSPITAL COURSE: The patient is a 74 year old African American female who came into the ED with a chief complaint of bright red blood per rectum and a syncopal spell. The patient has reported that she passed out while having a bowel movement. She fell off the commode, but did not hit her head. Please review history and physical for details. CT of the head is negative. Hemoglobin and hematocrit were within normal limits.   HOSPITAL COURSE BASED ON THE PROBLEM:  1.  Bright red blood per rectum/lower GI bleed. This was a painless bleed. CAT scan of the abdomen was done which has revealed no diverticulitis. The patient was given IV fluids. She was made n.p.o. Hemoglobin and hematocrit were monitored closely. She had colonoscopy done one month ago. The report was not  available at this time.  Gastroenterology consult was placed. After discussing with gastroenterology, the patient was started on a clear liquid diet. She was evaluated by gastroenterology, Dr. Gustavo Lah, who has recommended Anusol cream on as needed basis for hemorrhoids and reconsult him if needed if the patient's lower GI bleed recurs. Her hemoglobin was monitored closely which was stable and in the normal range. The patient denies any abdominal pain. Condition being stable, decision is made to discharge the patient home.  2.  Syncope, most likely vasovagal as she had the syncopal episode while having a bowel movement and fell off the commode. Cardiac enzymes were negative. No other episodes were noticed.  3.  Urinary tract infection. Cultures were sent. IV Rocephin was given. Urine culture has revealed mixed bacteria which is a contaminant. IV ceftriaxone was discontinued. The patient does not need any more antibiotics.  4.  Acute kidney injury, probably from dehydration. IV fluids were given. Renal function is back to normal. We will try to avoid nephrotoxins. No other recommendations at this time.  5.  Diabetes mellitus type 2. The patient is to continue her home dose Lantus and follow up with primary care physician. Continue sliding scale insulin.  6.  Hypertension. Blood pressure was elevated. Losartan dose was increased. Metoprolol is added to the regimen. Today, blood pressure is stable. PCP to follow up and up-titrate medications as-needed basis.  7.  Hypothyroidism. Continue Synthroid.   The patient is discharged home under satisfactory condition. Hospital course  was uneventful.   PHYSICAL EXAMINATION: VITAL SIGNS: Today, temperature 97.3, pulse 60, respirations 18, blood pressure 132/84, pulse oximetry 97%.  GENERAL APPEARANCE: Not in acute distress, morbidly obese.  HEENT: Normocephalic, atraumatic. Pupils are equally reacting to light and accommodation. No scleral icterus. No conjunctival  injection. No sinus tenderness. Moist mucous membranes.  NECK: Supple. No JVD. No thyromegaly. Range of motion is intact.  LUNGS: Clear to auscultation bilaterally. No accessory muscle use and no anterior chest wall tenderness on palpation.  CARDIAC: S1, S2 normal. Regular rate and rhythm.  GASTROINTESTINAL: Soft. Bowel sounds are positive in all 4 quadrants, obese, nontender. No masses felt.  NEUROLOGIC: Awake, alert, oriented x 3. Cranial nerves II through XII are grossly intact. Motor and sensory are intact. Reflexes are 2+.  EXTREMITIES: No edema, no cyanosis, no clubbing.  PSYCHIATRIC: Normal mood and affect.   LABORATORY AND IMAGING STUDIES: Troponin less than 0.03 x 2. TSH is normal. CBC normal. Urine culture, mixed bacteria. Urinalysis is positive for leukocyte esterase 3+, nitrite negative. BMP showed glucose 131, the rest of the BMP is normal. Hemoglobin A1c 6.6. CAT scan of the abdomen and pelvis with contrast has revealed no acute abnormality, cholelithiasis, gallbladder otherwise unremarkable, scattered calcification along the abdominal aorta and it branches. Scattered diverticulosis. Mild DJD in the lumbar spine. Mild bronchiectasis at the lung bases. CT head: No acute abnormality.   Plan of care was discussed in detail with the patient. She verbalized understanding of the plan.   TOTAL TIME SPENT ON THE DISCHARGE: 45 minutes.     ____________________________ Nicholes Mango, MD ag:at D: 11/11/2014 17:05:00 ET T: 11/11/2014 17:45:36 ET JOB#: [id number]  cc: Nicholes Mango, MD, <Dictator> Nicholes Mango MD ELECTRONICALLY SIGNED 11/22/2014 12:17

## 2015-07-19 ENCOUNTER — Encounter: Payer: Self-pay | Admitting: Emergency Medicine

## 2015-07-19 ENCOUNTER — Emergency Department
Admission: EM | Admit: 2015-07-19 | Discharge: 2015-07-19 | Disposition: A | Discharge status: Home / Self Care | Payer: Medicare Other | Attending: Emergency Medicine | Admitting: Emergency Medicine

## 2015-07-19 ENCOUNTER — Emergency Department: Payer: Medicare Other

## 2015-07-19 DIAGNOSIS — R252 Cramp and spasm: Secondary | ICD-10-CM | POA: Insufficient documentation

## 2015-07-19 DIAGNOSIS — I10 Essential (primary) hypertension: Secondary | ICD-10-CM | POA: Diagnosis not present

## 2015-07-19 DIAGNOSIS — E119 Type 2 diabetes mellitus without complications: Secondary | ICD-10-CM | POA: Insufficient documentation

## 2015-07-19 DIAGNOSIS — R0981 Nasal congestion: Secondary | ICD-10-CM | POA: Diagnosis not present

## 2015-07-19 DIAGNOSIS — M791 Myalgia, unspecified site: Secondary | ICD-10-CM

## 2015-07-19 DIAGNOSIS — R05 Cough: Secondary | ICD-10-CM | POA: Insufficient documentation

## 2015-07-19 HISTORY — DX: Major depressive disorder, single episode, unspecified: F32.9

## 2015-07-19 HISTORY — DX: Pure hypercholesterolemia, unspecified: E78.00

## 2015-07-19 HISTORY — DX: Essential (primary) hypertension: I10

## 2015-07-19 HISTORY — DX: Depression, unspecified: F32.A

## 2015-07-19 HISTORY — DX: Type 2 diabetes mellitus without complications: E11.9

## 2015-07-19 HISTORY — DX: Age-related osteoporosis without current pathological fracture: M81.0

## 2015-07-19 LAB — CBC WITH DIFFERENTIAL/PLATELET
BASOS PCT: 1 %
Basophils Absolute: 0 10*3/uL (ref 0–0.1)
Eosinophils Absolute: 0.1 10*3/uL (ref 0–0.7)
Eosinophils Relative: 2 %
HCT: 46.5 % (ref 35.0–47.0)
Hemoglobin: 14.6 g/dL (ref 12.0–16.0)
LYMPHS ABS: 2.8 10*3/uL (ref 1.0–3.6)
Lymphocytes Relative: 44 %
MCH: 27.3 pg (ref 26.0–34.0)
MCHC: 31.4 g/dL — ABNORMAL LOW (ref 32.0–36.0)
MCV: 87.1 fL (ref 80.0–100.0)
MONO ABS: 0.6 10*3/uL (ref 0.2–0.9)
MONOS PCT: 10 %
NEUTROS ABS: 2.7 10*3/uL (ref 1.4–6.5)
Neutrophils Relative %: 43 %
Platelets: 196 10*3/uL (ref 150–440)
RBC: 5.33 MIL/uL — ABNORMAL HIGH (ref 3.80–5.20)
RDW: 14 % (ref 11.5–14.5)
WBC: 6.3 10*3/uL (ref 3.6–11.0)

## 2015-07-19 LAB — COMPREHENSIVE METABOLIC PANEL
ALBUMIN: 4 g/dL (ref 3.5–5.0)
ALT: 26 U/L (ref 14–54)
ANION GAP: 7 (ref 5–15)
AST: 33 U/L (ref 15–41)
Alkaline Phosphatase: 115 U/L (ref 38–126)
BUN: 10 mg/dL (ref 6–20)
CALCIUM: 9.3 mg/dL (ref 8.9–10.3)
CO2: 27 mmol/L (ref 22–32)
CREATININE: 0.82 mg/dL (ref 0.44–1.00)
Chloride: 107 mmol/L (ref 101–111)
GFR calc Af Amer: >60 mL/min (ref >60)
GFR calc non Af Amer: >60 mL/min (ref >60)
GLUCOSE: 152 mg/dL — AB (ref 65–99)
Potassium: 3.8 mmol/L (ref 3.5–5.1)
SODIUM: 141 mmol/L (ref 135–145)
Total Bilirubin: 1 mg/dL (ref 0.3–1.2)
Total Protein: 8.1 g/dL (ref 6.5–8.1)

## 2015-07-19 LAB — GLUCOSE, CAPILLARY: Glucose-Capillary: 120 mg/dL — ABNORMAL HIGH (ref 65–99)

## 2015-07-19 LAB — URINALYSIS COMPLETE WITH MICROSCOPIC (ARMC ONLY)
BACTERIA UA: NONE SEEN
Bilirubin Urine: NEGATIVE
GLUCOSE, UA: NEGATIVE mg/dL
Hgb urine dipstick: NEGATIVE
Ketones, ur: NEGATIVE mg/dL
LEUKOCYTES UA: NEGATIVE
Nitrite: NEGATIVE
PH: 7 (ref 5.0–8.0)
Protein, ur: NEGATIVE mg/dL
Specific Gravity, Urine: 1.021 (ref 1.005–1.030)

## 2015-07-19 MED ORDER — DIAZEPAM 5 MG/ML IJ SOLN
5.0000 mg | Freq: Once | INTRAMUSCULAR | Status: AC
  Administered 2015-07-19: 5 mg via INTRAVENOUS
  Filled 2015-07-19: qty 2

## 2015-07-19 MED ORDER — CYCLOBENZAPRINE HCL 10 MG PO TABS: 10.0000 mg | ORAL_TABLET | Freq: Three times a day (TID) | ORAL | Status: DC | PRN

## 2015-07-19 MED ORDER — KETOROLAC TROMETHAMINE 30 MG/ML IJ SOLN
30.0000 mg | Freq: Once | INTRAMUSCULAR | Status: AC
  Administered 2015-07-19: 30 mg via INTRAVENOUS

## 2015-07-19 MED ORDER — MELOXICAM 15 MG PO TABS: 15.0000 mg | ORAL_TABLET | Freq: Every day | ORAL | Status: DC

## 2015-07-19 MED ORDER — KETOROLAC TROMETHAMINE 30 MG/ML IJ SOLN
60.0000 mg | Freq: Once | INTRAMUSCULAR | Status: DC
  Filled 2015-07-19: qty 2

## 2015-07-19 NOTE — Discharge Instructions (Signed)
Muscle Pain, Adult  Muscle pain (myalgia) may be caused by many things, including:  · Overuse or muscle strain, especially if you are not in shape. This is the most common cause of muscle pain.  · Injury.  · Bruises.  · Viruses, such as the flu.  · Infectious diseases.  · Fibromyalgia, which is a chronic condition that causes muscle tenderness, fatigue, and headache.  · Autoimmune diseases, including lupus.  · Certain drugs, including ACE inhibitors and statins.  Muscle pain may be mild or severe. In most cases, the pain lasts only a short time and goes away without treatment. To diagnose the cause of your muscle pain, your health care provider will take your medical history. This means he or she will ask you when your muscle pain began and what has been happening. If you have not had muscle pain for very long, your health care provider may want to wait before doing much testing. If your muscle pain has lasted a long time, your health care provider may want to run tests right away. If your health care provider thinks your muscle pain may be caused by illness, you may need to have additional tests to rule out certain conditions.   Treatment for muscle pain depends on the cause. Home care is often enough to relieve muscle pain. Your health care provider may also prescribe anti-inflammatory medicine.  HOME CARE INSTRUCTIONS  Watch your condition for any changes. The following actions may help to lessen any discomfort you are feeling:  · Only take over-the-counter or prescription medicines as directed by your health care provider.  · Apply ice to the sore muscle:    Put ice in a plastic bag.    Place a towel between your skin and the bag.    Leave the ice on for 15-20 minutes, 3-4 times a day.  · You may alternate applying hot and cold packs to the muscle as directed by your health care provider.  · If overuse is causing your muscle pain, slow down your activities until the pain goes away.    Remember that it is normal  to feel some muscle pain after starting a workout program. Muscles that have not been used often will be sore at first.    Do regular, gentle exercises if you are not usually active.    Warm up before exercising to lower your risk of muscle pain.  · Do not continue working out if the pain is very bad. Bad pain could mean you have injured a muscle.  SEEK MEDICAL CARE IF:  · Your muscle pain gets worse, and medicines do not help.  · You have muscle pain that lasts longer than 3 days.  · You have a rash or fever along with muscle pain.  · You have muscle pain after a tick bite.  · You have muscle pain while working out, even though you are in good physical condition.  · You have redness, soreness, or swelling along with muscle pain.  · You have muscle pain after starting a new medicine or changing the dose of a medicine.  SEEK IMMEDIATE MEDICAL CARE IF:  · You have trouble breathing.  · You have trouble swallowing.  · You have muscle pain along with a stiff neck, fever, and vomiting.  · You have severe muscle weakness or cannot move part of your body.  MAKE SURE YOU:   · Understand these instructions.  · Will watch your condition.  · Will get   help right away if you are not doing well or get worse.     This information is not intended to replace advice given to you by your health care provider. Make sure you discuss any questions you have with your health care provider.     Document Released: 06/26/2006 Document Revised: 08/25/2014 Document Reviewed: 05/31/2013  Elsevier Interactive Patient Education ©2016 [employer]

## 2015-07-19 NOTE — ED Notes (Signed)
Patient presents to the ED with body aches, particularly leg pain.  Patient states, "I really just don't feel good."  Patient states she has been hurting for a week.  Patient is also concerned about her blood sugar although levels have been wnl for a diabetic today.  Patient denies headache, denies blurry vision.  Reports congestion.

## 2015-07-19 NOTE — ED Provider Notes (Signed)
[facility] Emergency Department Provider Note  ____________________________________________  Time seen: Approximately 12:22 PM  I have reviewed the triage vital signs and the nursing notes.   HISTORY  Chief Complaint Generalized Body Aches   HPI Melanie Young is a 74 y.o. female who presents to the emergency room with complaints of vague body aches bilateral leg pain with muscle contractions and spasms." I really just don't feel good". Patient states that she is a diabetic concern about her blood sugar blood pressure. Denies having a headache or blurry vision. Reports cough and congestion.   Past Medical History  Diagnosis Date  . Diabetes mellitus without complication ([place])   . Hypertension   . Hypercholesteremia   . Osteoporosis   . Depression   . Cancer ([place])     There are no active problems to display for this patient.   Past Surgical History  Procedure Laterality Date  . Breast surgery      Current Outpatient Rx  Name  Route  Sig  Dispense  Refill  . cyclobenzaprine (FLEXERIL) 10 MG tablet   Oral   Take 1 tablet (10 mg total) by mouth every 8 (eight) hours as needed for muscle spasms.   30 tablet   1   . meloxicam (MOBIC) 15 MG tablet   Oral   Take 1 tablet (15 mg total) by mouth daily.   30 tablet   0     Allergies Review of patient's allergies indicates no known allergies.  No family history on file.  Social History Social History  Substance Use Topics  . Smoking status: Never Smoker   . Smokeless tobacco: None  . Alcohol Use: No    Review of Systems Constitutional: No fever/chills positive for fatigue and weakness. Vital signs reviewed blood pressure noted to be elevated. Eyes: No visual changes. ENT: No sore throat. Cardiovascular: Denies chest pain. Respiratory: Denies shortness of breath. Positive for cough Gastrointestinal: No abdominal pain.  No nausea, no vomiting.  No diarrhea.  No  constipation. Genitourinary: Negative for dysuria. Musculoskeletal: Negative for back pain. Positive for severe bilateral muscle cramps and pains Skin: Negative for rash. Neurological: Negative for headaches, focal weakness or numbness.  10-point ROS otherwise negative.  ____________________________________________   PHYSICAL EXAM:  VITAL SIGNS: ED Triage Vitals  Enc Vitals Group     BP 07/19/15 1202 180/108 mmHg     Pulse Rate 07/19/15 1202 90     Resp 07/19/15 1202 20     Temp 07/19/15 1202 98.3 F (36.8 C)     Temp Source 07/19/15 1202 Oral     SpO2 07/19/15 1202 99 %     Weight 07/19/15 1202 215 lb (97.523 kg)     Height 07/19/15 1202 5\' 4"  (1.626 m)     Head Cir --      Peak Flow --      Pain Score 07/19/15 1222 7     Pain Loc --      Pain Edu? --      Excl. in [place]? --     Constitutional: Alert and oriented. Well appearing and in no acute distress. Eyes: Conjunctivae are normal. PERRL. EOMI. Head: Atraumatic. Nose: No congestion/rhinnorhea. Mouth/Throat: Mucous membranes are moist.  Oropharynx non-erythematous. Neck: No stridor.   Cardiovascular: Normal rate, regular rhythm. Grossly normal heart sounds.  Good peripheral circulation. Respiratory: Normal respiratory effort.  No retractions. Lungs CTAB. Gastrointestinal: Soft and nontender. No distention. No abdominal bruits. No CVA tenderness. Musculoskeletal: No lower  extremity tenderness nor edema.  No joint effusions. Neurologic:  Normal speech and language. No gross focal neurologic deficits are appreciated. No gait instability. Skin:  Skin is warm, dry and intact. No rash noted. Psychiatric: Mood and affect are normal. Speech and behavior are normal.  ____________________________________________   LABS (all labs ordered are listed, but only abnormal results are displayed)  Labs Reviewed  GLUCOSE, CAPILLARY - Abnormal; Notable for the following:    Glucose-Capillary 120 (*)    All other components within  normal limits  COMPREHENSIVE METABOLIC PANEL - Abnormal; Notable for the following:    Glucose, Bld 152 (*)    All other components within normal limits  CBC WITH DIFFERENTIAL/PLATELET - Abnormal; Notable for the following:    RBC 5.33 (*)    MCHC 31.4 (*)    All other components within normal limits  URINALYSIS COMPLETEWITH MICROSCOPIC ([facility] ONLY) - Abnormal; Notable for the following:    Color, Urine YELLOW (*)    APPearance CLEAR (*)    Squamous Epithelial / LPF 0-5 (*)    All other components within normal limits    RADIOLOGY  Negative ____________________________________________   PROCEDURES  Procedure(s) performed: None  Critical Care performed: No  ____________________________________________   INITIAL IMPRESSION / ASSESSMENT AND PLAN / ED COURSE  Pertinent labs & imaging results that were available during my care of the patient were reviewed by me and considered in my medical decision making (see chart for details).  Acute nonspecific myofascial strain with bilateral leg cramps. Electrolytes all within normal limits sugar elevated slightly at 153. Patient given thiamine 5 mg and Toradol 30 mg IV while in the ED with relief. Discharged home with Flexeril and Mobic daily. Patient to follow up with PCP. Patient voices no other emergency medical complaints at this time and will return to the ER with any worsening symptomology. ____________________________________________   FINAL CLINICAL IMPRESSION(S) / ED DIAGNOSES  Final diagnoses:  Myalgia      Arlyss Repress, PA-C 07/19/15 1420  Wandra Arthurs, MD 07/19/15 [phone]

## 2015-10-19 ENCOUNTER — Encounter: Payer: Self-pay | Admitting: Emergency Medicine

## 2015-10-19 ENCOUNTER — Emergency Department
Admission: EM | Admit: 2015-10-19 | Discharge: 2015-10-19 | Disposition: A | Discharge status: Home / Self Care | Payer: Medicare Other | Attending: Emergency Medicine | Admitting: Emergency Medicine

## 2015-10-19 ENCOUNTER — Emergency Department: Payer: Medicare Other

## 2015-10-19 DIAGNOSIS — Z791 Long term (current) use of non-steroidal anti-inflammatories (NSAID): Secondary | ICD-10-CM | POA: Diagnosis not present

## 2015-10-19 DIAGNOSIS — I10 Essential (primary) hypertension: Secondary | ICD-10-CM | POA: Insufficient documentation

## 2015-10-19 DIAGNOSIS — Y9289 Other specified places as the place of occurrence of the external cause: Secondary | ICD-10-CM | POA: Diagnosis not present

## 2015-10-19 DIAGNOSIS — S4991XA Unspecified injury of right shoulder and upper arm, initial encounter: Secondary | ICD-10-CM | POA: Diagnosis present

## 2015-10-19 DIAGNOSIS — W1839XA Other fall on same level, initial encounter: Secondary | ICD-10-CM | POA: Insufficient documentation

## 2015-10-19 DIAGNOSIS — Y998 Other external cause status: Secondary | ICD-10-CM | POA: Insufficient documentation

## 2015-10-19 DIAGNOSIS — S42294A Other nondisplaced fracture of upper end of right humerus, initial encounter for closed fracture: Secondary | ICD-10-CM | POA: Insufficient documentation

## 2015-10-19 DIAGNOSIS — Y9389 Activity, other specified: Secondary | ICD-10-CM | POA: Diagnosis not present

## 2015-10-19 DIAGNOSIS — S42301A Unspecified fracture of shaft of humerus, right arm, initial encounter for closed fracture: Secondary | ICD-10-CM

## 2015-10-19 DIAGNOSIS — E119 Type 2 diabetes mellitus without complications: Secondary | ICD-10-CM | POA: Insufficient documentation

## 2015-10-19 MED ORDER — HYDROCODONE-ACETAMINOPHEN 5-325 MG PO TABS: 1.0000 | ORAL_TABLET | Freq: Four times a day (QID) | ORAL | Status: AC | PRN

## 2015-10-19 MED ORDER — HYDROCODONE-ACETAMINOPHEN 5-325 MG PO TABS
2.0000 | ORAL_TABLET | Freq: Once | ORAL | Status: AC
  Administered 2015-10-19: 2 via ORAL
  Filled 2015-10-19: qty 2

## 2015-10-19 NOTE — ED Provider Notes (Signed)
[facility] Emergency Department Provider Note  ____________________________________________  Time seen: Approximately 12:14 PM  I have reviewed the triage vital signs and the nursing notes.   HISTORY  Chief Complaint Fall    HPI Melanie Young is a 75 y.o. female who presents with shoulder pain secondary to a fall this morning. The patient reports that she was opening her door, lost balance and fell on her right side. States this occurs if she does not eat first thing in the morning due to her diabetes. She had not eaten when the fall occurred. She describes pain down her right arm but is primarily complaining of right shoulder pain. The pain is sharp and exacerbated by movement. She denies radiation of pain and ranks the severity of her pain to be a 10/10. Use of an ice pack and hot water bottle did not improve her pain. She did hit her head when she fell, but she denies LOC, lightheadedness, dizziness, nausea, vomiting, changes in vision or altered mental status after the fall. The only other symptom she noticed was chills.   Past Medical History  Diagnosis Date  . Diabetes mellitus without complication ([place])   . Hypertension   . Hypercholesteremia   . Osteoporosis   . Depression   . Cancer ([place])     There are no active problems to display for this patient.   Past Surgical History  Procedure Laterality Date  . Breast surgery      Current Outpatient Rx  Name  Route  Sig  Dispense  Refill  . cyclobenzaprine (FLEXERIL) 10 MG tablet   Oral   Take 1 tablet (10 mg total) by mouth every 8 (eight) hours as needed for muscle spasms.   30 tablet   1   . HYDROcodone-acetaminophen (NORCO) 5-325 MG tablet   Oral   Take 1 tablet by mouth every 6 (six) hours as needed for severe pain.   20 tablet   0   . meloxicam (MOBIC) 15 MG tablet   Oral   Take 1 tablet (15 mg total) by mouth daily.   30 tablet   0     Allergies Review of patient's allergies  indicates no known allergies.  No family history on file.  Social History Social History  Substance Use Topics  . Smoking status: Never Smoker   . Smokeless tobacco: None  . Alcohol Use: No     Review of Systems  Constitutional: No fever. Endorses chills.  Eyes: No visual changes.  Cardiovascular:  No chest pain, palpitations.  Respiratory: No cough. No shortness of breath.  Gastrointestinal: No abdominal pain.  No nausea, vomiting.   Musculoskeletal: Positive right shoulder/arm pain. Negative for back pain.  Skin: Negative for rash, bruising, open wounds.  Neurological: Negative for headaches, focal weakness or numbness. No dizziness.  10-point ROS otherwise negative.  ____________________________________________   PHYSICAL EXAM:  VITAL SIGNS: ED Triage Vitals  Enc Vitals Group     BP 10/19/15 1148 108/59 mmHg     Pulse Rate 10/19/15 1148 72     Resp 10/19/15 1148 18     Temp 10/19/15 1148 98.6 F (37 C)     Temp Source 10/19/15 1148 Oral     SpO2 10/19/15 1148 100 %     Weight 10/19/15 1143 220 lb (99.791 kg)     Height 10/19/15 1143 5\' 5"  (1.651 m)     Head Cir --      Peak Flow --  Pain Score 10/19/15 1144 0     Pain Loc --      Pain Edu? --      Excl. in [place]? --      Constitutional: Alert and oriented. Patient appears to be in pain and is holding her right hand to support her right shoulder, but does not appear to be in acute distress. Eyes: Conjunctivae are normal. PERRL. EOMI without pain.  Head: Atraumatic. Neck: No cervical spine tenderness to palpation. Supple with FROM. Cardiovascular: Normal rate, regular rhythm. Grossly normal S1 and S2.  Good peripheral circulation. Respiratory: Normal respiratory effort without tachypnea or retractions.  Musculoskeletal: Right shoulder tenderness to palpation of lateral, proximal humerus. Tender over the glenoid.  Limited ROM of right shoulder and elbow due to pain. Full ROM of right wrist. 5/5 grip strength.   Neurologic:  Normal speech and language. No gross focal neurologic deficits are appreciated.  Skin:  Skin is warm, dry and intact. No rash, lacerations, bruising noted. Psychiatric: Mood and affect are normal. Speech and behavior are normal. Patient exhibits appropriate insight and judgement.   ____________________________________________   LABS  None ____________________________________________  EKG  None ____________________________________________  RADIOLOGY I have personally viewed and evaluated these images (plain radiographs) as part of my medical decision making, as well as reviewing the written report by the radiologist.  Dg Shoulder Right  10/19/2015  CLINICAL DATA:  Pt states that at 4 am this morning she was walking and fell onto right arm. Pt is in extreme pain and unable to move arm. EXAM: RIGHT SHOULDER - 2+ VIEW COMPARISON:  None. FINDINGS: There is fracture the proximal humerus. Primary fracture component is transverse across the metaphysis. There is a secondary nondisplaced fracture across the base of the greater tuberosity. There is no significant displacement or angulation of the primary fracture components. There is narrowing of the glenohumeral joint with mild spurring from the humeral head. Glenohumeral joint is normally aligned as is the [facility] joint. Bones are demineralized. IMPRESSION: 1. Nondisplaced, mildly comminuted, fracture of the proximal right humerus. No dislocation. Electronically Signed   By: Lajean Manes M.D.   On: 10/19/2015 13:15    ____________________________________________    PROCEDURES  Procedure(s) performed: None    Medications  HYDROcodone-acetaminophen (NORCO/VICODIN) 5-325 MG per tablet 2 tablet (2 tablets Oral Given 10/19/15 1348)     ____________________________________________   INITIAL IMPRESSION / ASSESSMENT AND PLAN / ED COURSE  Pertinent imaging results that were available during my care of the patient were reviewed by me  and considered in my medical decision making (see chart for details).  Patient's diagnosis is consistent with proximal right humeral fracture. Patient given 2 tablets Norco 5/325 mg while in the ED and noted decrease in pain. Was placed in a shoulder immobilizer for support and comfort. Patient will be discharged home with prescriptions for Norco 5/325 mg to take one tablet by mouth every 6 hours as needed for pain. Patient is to follow up with Dr. Mack Guise in orthopedics on Monday for follow-up. Patient is given ED precautions to return to the ED for any worsening or new symptoms.    ____________________________________________  FINAL CLINICAL IMPRESSION(S) / ED DIAGNOSES  Final diagnoses:  Right humeral fracture, closed, initial encounter      NEW MEDICATIONS STARTED DURING THIS VISIT:  New Prescriptions   HYDROCODONE-ACETAMINOPHEN (NORCO) 5-325 MG TABLET    Take 1 tablet by mouth every 6 (six) hours as needed for severe pain.  Braxton Feathers, PA-C 10/19/15 1431  Earleen Newport, MD 10/19/15 1444

## 2015-10-19 NOTE — ED Notes (Signed)
Pt eating [employer] during triage.

## 2015-10-19 NOTE — Discharge Instructions (Signed)
Humerus Fracture Treated With Immobilization The humerus is the large bone in the upper arm. A broken (fractured) humerus is often treated by wearing a cast, splint, or sling (immobilization). This holds the broken pieces in place so they can heal.  HOME CARE  Put ice on the injured area.  Put ice in a plastic bag.  Place a towel between your skin and the bag.  Leave the ice on for 15-20 minutes, 03-04 times a day.  If you are given a cast:  Do not scratch the skin under the cast.  Check the skin around the cast every day. You may put lotion on any red or sore areas.  Keep the cast dry and clean.  If you are given a splint:  Wear the splint as told.  Keep the splint clean and dry.  Loosen the elastic around the splint if your fingers become numb, cold, tingle, or turn blue.  If you are given a sling:  Wear the sling as told.  Do not put pressure on any part of the cast or splint until it is fully hardened.  The cast or splint must be protected with a plastic bag during bathing. Do not lower the cast or splint into water.  Only take medicine as told by your doctor.  Do exercises as told by your doctor.  Follow up as told by your doctor. GET HELP RIGHT AWAY IF:   Your skin or fingernails turn blue or gray.  Your arm feels cold or numb.  You have very bad pain in the injured arm.  You are having problems with the medicines you were given. MAKE SURE YOU:   Understand these instructions.  Will watch your condition.  Will get help right away if you are not doing well or get worse.   This information is not intended to replace advice given to you by your health care provider. Make sure you discuss any questions you have with your health care provider.   Document Released: 01/21/2008 Document Revised: 08/25/2014 Document Reviewed: 12/27/2014 Elsevier Interactive Patient Education [employer].

## 2015-10-19 NOTE — ED Notes (Signed)
Pt here with c/o right arm and shoulder pain after falling early this am. Pt crying out in pain and tearful in triage.

## 2016-09-20 ENCOUNTER — Emergency Department
Admission: EM | Admit: 2016-09-20 | Discharge: 2016-09-21 | Payer: Medicare Other | Attending: Emergency Medicine | Admitting: Emergency Medicine

## 2016-09-20 DIAGNOSIS — E119 Type 2 diabetes mellitus without complications: Secondary | ICD-10-CM | POA: Insufficient documentation

## 2016-09-20 DIAGNOSIS — Y999 Unspecified external cause status: Secondary | ICD-10-CM | POA: Diagnosis not present

## 2016-09-20 DIAGNOSIS — Y929 Unspecified place or not applicable: Secondary | ICD-10-CM | POA: Insufficient documentation

## 2016-09-20 DIAGNOSIS — I10 Essential (primary) hypertension: Secondary | ICD-10-CM | POA: Insufficient documentation

## 2016-09-20 DIAGNOSIS — X16XXXA Contact with hot heating appliances, radiators and pipes, initial encounter: Secondary | ICD-10-CM | POA: Insufficient documentation

## 2016-09-20 DIAGNOSIS — L03115 Cellulitis of right lower limb: Secondary | ICD-10-CM | POA: Diagnosis not present

## 2016-09-20 DIAGNOSIS — T24231A Burn of second degree of right lower leg, initial encounter: Secondary | ICD-10-CM | POA: Diagnosis not present

## 2016-09-20 DIAGNOSIS — Z79899 Other long term (current) drug therapy: Secondary | ICD-10-CM | POA: Insufficient documentation

## 2016-09-20 DIAGNOSIS — Y9389 Activity, other specified: Secondary | ICD-10-CM | POA: Diagnosis not present

## 2016-09-20 DIAGNOSIS — L03116 Cellulitis of left lower limb: Secondary | ICD-10-CM | POA: Diagnosis not present

## 2016-09-20 DIAGNOSIS — T300 Burn of unspecified body region, unspecified degree: Secondary | ICD-10-CM
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
## 2016-09-21 DIAGNOSIS — T24231A Burn of second degree of right lower leg, initial encounter: Secondary | ICD-10-CM | POA: Diagnosis not present

## 2016-09-21 LAB — CBC WITH DIFFERENTIAL/PLATELET
BASOS ABS: 0 10*3/uL (ref 0–0.1)
BASOS PCT: 0 %
EOS ABS: 0.1 10*3/uL (ref 0–0.7)
EOS PCT: 2 %
HCT: 41.8 % (ref 35.0–47.0)
Hemoglobin: 13.2 g/dL (ref 12.0–16.0)
Lymphocytes Relative: 35 %
Lymphs Abs: 2.3 10*3/uL (ref 1.0–3.6)
MCH: 27.5 pg (ref 26.0–34.0)
MCHC: 31.5 g/dL — ABNORMAL LOW (ref 32.0–36.0)
MCV: 87.2 fL (ref 80.0–100.0)
Monocytes Absolute: 0.7 10*3/uL (ref 0.2–0.9)
Monocytes Relative: 11 %
Neutro Abs: 3.4 10*3/uL (ref 1.4–6.5)
Neutrophils Relative %: 52 %
PLATELETS: 232 10*3/uL (ref 150–440)
RBC: 4.79 MIL/uL (ref 3.80–5.20)
RDW: 14.6 % — ABNORMAL HIGH (ref 11.5–14.5)
WBC: 6.5 10*3/uL (ref 3.6–11.0)

## 2016-09-21 LAB — COMPREHENSIVE METABOLIC PANEL
ALT: 20 U/L (ref 14–54)
AST: 28 U/L (ref 15–41)
Albumin: 3.9 g/dL (ref 3.5–5.0)
Alkaline Phosphatase: 121 U/L (ref 38–126)
Anion gap: 7 (ref 5–15)
BILIRUBIN TOTAL: 0.6 mg/dL (ref 0.3–1.2)
BUN: 21 mg/dL — ABNORMAL HIGH (ref 6–20)
CO2: 29 mmol/L (ref 22–32)
CREATININE: 0.81 mg/dL (ref 0.44–1.00)
Calcium: 9.4 mg/dL (ref 8.9–10.3)
Chloride: 104 mmol/L (ref 101–111)
GFR calc Af Amer: >60 mL/min (ref >60)
Glucose, Bld: 155 mg/dL — ABNORMAL HIGH (ref 65–99)
POTASSIUM: 3.9 mmol/L (ref 3.5–5.1)
Sodium: 140 mmol/L (ref 135–145)
TOTAL PROTEIN: 8 g/dL (ref 6.5–8.1)

## 2016-09-21 MED ORDER — CEFAZOLIN SODIUM-DEXTROSE 2-4 GM/100ML-% IV SOLN: 2.0000 g | Freq: Three times a day (TID) | INTRAVENOUS | Status: DC

## 2016-09-21 MED ORDER — DOXYCYCLINE HYCLATE 100 MG IV SOLR
100.0000 mg | Freq: Once | INTRAVENOUS | Status: AC
  Administered 2016-09-21: 100 mg via INTRAVENOUS
  Filled 2016-09-21: qty 100

## 2016-09-21 MED ORDER — CEFAZOLIN SODIUM-DEXTROSE 2-3 GM-% IV SOLR
2.0000 g | Freq: Three times a day (TID) | INTRAVENOUS | Status: DC
  Administered 2016-09-21: 2 g via INTRAVENOUS
  Filled 2016-09-21 (×3): qty 50

## 2016-09-26 LAB — CULTURE, BLOOD (ROUTINE X 2)
CULTURE: NO GROWTH
CULTURE: NO GROWTH

## 2017-08-10 ENCOUNTER — Observation Stay
Admission: EM | Admit: 2017-08-10 | Discharge: 2017-08-13 | Disposition: A | Discharge status: Skilled Nursing Facility | Payer: Medicare Other | Attending: Internal Medicine | Admitting: Internal Medicine

## 2017-08-10 ENCOUNTER — Encounter: Payer: Self-pay | Admitting: Emergency Medicine

## 2017-08-10 ENCOUNTER — Emergency Department: Payer: Medicare Other

## 2017-08-10 ENCOUNTER — Other Ambulatory Visit: Payer: Self-pay

## 2017-08-10 DIAGNOSIS — N179 Acute kidney failure, unspecified: Secondary | ICD-10-CM | POA: Diagnosis present

## 2017-08-10 DIAGNOSIS — R001 Bradycardia, unspecified: Secondary | ICD-10-CM | POA: Insufficient documentation

## 2017-08-10 DIAGNOSIS — C801 Malignant (primary) neoplasm, unspecified: Secondary | ICD-10-CM | POA: Diagnosis not present

## 2017-08-10 DIAGNOSIS — M810 Age-related osteoporosis without current pathological fracture: Secondary | ICD-10-CM | POA: Insufficient documentation

## 2017-08-10 DIAGNOSIS — E669 Obesity, unspecified: Secondary | ICD-10-CM | POA: Insufficient documentation

## 2017-08-10 DIAGNOSIS — D72829 Elevated white blood cell count, unspecified: Secondary | ICD-10-CM | POA: Insufficient documentation

## 2017-08-10 DIAGNOSIS — R42 Dizziness and giddiness: Secondary | ICD-10-CM | POA: Diagnosis not present

## 2017-08-10 DIAGNOSIS — Z79899 Other long term (current) drug therapy: Secondary | ICD-10-CM | POA: Diagnosis not present

## 2017-08-10 DIAGNOSIS — E872 Acidosis: Secondary | ICD-10-CM | POA: Diagnosis not present

## 2017-08-10 DIAGNOSIS — R531 Weakness: Secondary | ICD-10-CM | POA: Diagnosis not present

## 2017-08-10 DIAGNOSIS — I10 Essential (primary) hypertension: Secondary | ICD-10-CM | POA: Insufficient documentation

## 2017-08-10 DIAGNOSIS — E7800 Pure hypercholesterolemia, unspecified: Secondary | ICD-10-CM | POA: Insufficient documentation

## 2017-08-10 DIAGNOSIS — T447X5A Adverse effect of beta-adrenoreceptor antagonists, initial encounter: Secondary | ICD-10-CM | POA: Diagnosis not present

## 2017-08-10 DIAGNOSIS — E860 Dehydration: Secondary | ICD-10-CM | POA: Diagnosis present

## 2017-08-10 DIAGNOSIS — Z23 Encounter for immunization: Secondary | ICD-10-CM | POA: Diagnosis not present

## 2017-08-10 DIAGNOSIS — E119 Type 2 diabetes mellitus without complications: Secondary | ICD-10-CM | POA: Insufficient documentation

## 2017-08-10 DIAGNOSIS — F329 Major depressive disorder, single episode, unspecified: Secondary | ICD-10-CM | POA: Diagnosis not present

## 2017-08-10 DIAGNOSIS — Z79891 Long term (current) use of opiate analgesic: Secondary | ICD-10-CM | POA: Diagnosis not present

## 2017-08-10 LAB — LIPASE, BLOOD: LIPASE: 19 U/L (ref 11–51)

## 2017-08-10 LAB — URINALYSIS, COMPLETE (UACMP) WITH MICROSCOPIC
BILIRUBIN URINE: NEGATIVE
Bacteria, UA: NONE SEEN
GLUCOSE, UA: NEGATIVE mg/dL
HGB URINE DIPSTICK: NEGATIVE
Ketones, ur: NEGATIVE mg/dL
LEUKOCYTES UA: NEGATIVE
NITRITE: NEGATIVE
PROTEIN: NEGATIVE mg/dL
SPECIFIC GRAVITY, URINE: 1.018 (ref 1.005–1.030)
pH: 6 (ref 5.0–8.0)

## 2017-08-10 LAB — CBC
HCT: 41.3 % (ref 35.0–47.0)
HEMOGLOBIN: 13 g/dL (ref 12.0–16.0)
MCH: 27.7 pg (ref 26.0–34.0)
MCHC: 31.5 g/dL — AB (ref 32.0–36.0)
MCV: 88.1 fL (ref 80.0–100.0)
Platelets: 190 10*3/uL (ref 150–440)
RBC: 4.69 MIL/uL (ref 3.80–5.20)
RDW: 14.7 % — ABNORMAL HIGH (ref 11.5–14.5)
WBC: 15.3 10*3/uL — AB (ref 3.6–11.0)

## 2017-08-10 LAB — HEPATIC FUNCTION PANEL
ALBUMIN: 3.7 g/dL (ref 3.5–5.0)
ALK PHOS: 113 U/L (ref 38–126)
ALT: 25 U/L (ref 14–54)
AST: 33 U/L (ref 15–41)
BILIRUBIN DIRECT: 0.1 mg/dL (ref 0.1–0.5)
BILIRUBIN INDIRECT: 1 mg/dL — AB (ref 0.3–0.9)
BILIRUBIN TOTAL: 1.1 mg/dL (ref 0.3–1.2)
Total Protein: 7.2 g/dL (ref 6.5–8.1)

## 2017-08-10 LAB — BASIC METABOLIC PANEL
ANION GAP: 6 (ref 5–15)
BUN: 24 mg/dL — ABNORMAL HIGH (ref 6–20)
CALCIUM: 9 mg/dL (ref 8.9–10.3)
CO2: 28 mmol/L (ref 22–32)
Chloride: 101 mmol/L (ref 101–111)
Creatinine, Ser: 1.48 mg/dL — ABNORMAL HIGH (ref 0.44–1.00)
GFR, EST AFRICAN AMERICAN: 39 mL/min — AB (ref >60)
GFR, EST NON AFRICAN AMERICAN: 34 mL/min — AB (ref >60)
GLUCOSE: 157 mg/dL — AB (ref 65–99)
Potassium: 4.1 mmol/L (ref 3.5–5.1)
SODIUM: 135 mmol/L (ref 135–145)

## 2017-08-10 LAB — LACTIC ACID, PLASMA
LACTIC ACID, VENOUS: 2 mmol/L — AB (ref 0.5–1.9)
Lactic Acid, Venous: 2.7 mmol/L (ref 0.5–1.9)

## 2017-08-10 LAB — TROPONIN I: Troponin I: <0.03 ng/mL (ref <0.03)

## 2017-08-10 LAB — AMMONIA: Ammonia: 26 umol/L (ref 9–35)

## 2017-08-10 LAB — TSH: TSH: 2.947 u[IU]/mL (ref 0.350–4.500)

## 2017-08-10 MED ORDER — SENNOSIDES-DOCUSATE SODIUM 8.6-50 MG PO TABS: 1.0000 | ORAL_TABLET | Freq: Every evening | ORAL | Status: DC | PRN

## 2017-08-10 MED ORDER — HYDROCODONE-ACETAMINOPHEN 5-325 MG PO TABS
1.0000 | ORAL_TABLET | ORAL | Status: DC | PRN
  Administered 2017-08-12: 2 via ORAL
  Administered 2017-08-12: 1 via ORAL
  Administered 2017-08-13 (×2): 2 via ORAL
  Filled 2017-08-10 (×3): qty 2
  Filled 2017-08-10: qty 1

## 2017-08-10 MED ORDER — HEPARIN SODIUM (PORCINE) 5000 UNIT/ML IJ SOLN
5000.0000 [IU] | Freq: Three times a day (TID) | INTRAMUSCULAR | Status: DC
  Administered 2017-08-10 – 2017-08-13 (×9): 5000 [IU] via SUBCUTANEOUS
  Filled 2017-08-10 (×9): qty 1

## 2017-08-10 MED ORDER — ARIPIPRAZOLE 2 MG PO TABS
2.0000 mg | ORAL_TABLET | Freq: Every day | ORAL | Status: DC
  Administered 2017-08-11 – 2017-08-13 (×3): 2 mg via ORAL
  Filled 2017-08-10 (×4): qty 1

## 2017-08-10 MED ORDER — ONDANSETRON HCL 4 MG PO TABS: 4.0000 mg | ORAL_TABLET | Freq: Four times a day (QID) | ORAL | Status: DC | PRN

## 2017-08-10 MED ORDER — ACETAMINOPHEN 650 MG RE SUPP: 650.0000 mg | Freq: Four times a day (QID) | RECTAL | Status: DC | PRN

## 2017-08-10 MED ORDER — BUPROPION HCL ER (SR) 150 MG PO TB12
150.0000 mg | ORAL_TABLET | Freq: Two times a day (BID) | ORAL | Status: DC
  Administered 2017-08-10 – 2017-08-13 (×6): 150 mg via ORAL
  Filled 2017-08-10 (×7): qty 1

## 2017-08-10 MED ORDER — LAMOTRIGINE 100 MG PO TABS
200.0000 mg | ORAL_TABLET | Freq: Every day | ORAL | Status: DC
  Administered 2017-08-11 – 2017-08-13 (×3): 200 mg via ORAL
  Filled 2017-08-10 (×3): qty 2

## 2017-08-10 MED ORDER — SODIUM CHLORIDE 0.9 % IV SOLN
INTRAVENOUS | Status: DC
  Administered 2017-08-10: 22:00:00 via INTRAVENOUS

## 2017-08-10 MED ORDER — ACETAMINOPHEN 325 MG PO TABS
650.0000 mg | ORAL_TABLET | Freq: Four times a day (QID) | ORAL | Status: DC | PRN
  Administered 2017-08-11: 650 mg via ORAL
  Filled 2017-08-10 (×2): qty 2

## 2017-08-10 MED ORDER — LEVOTHYROXINE SODIUM 50 MCG PO TABS
150.0000 ug | ORAL_TABLET | Freq: Every day | ORAL | Status: DC
  Administered 2017-08-11 – 2017-08-13 (×3): 150 ug via ORAL
  Filled 2017-08-10 (×3): qty 1

## 2017-08-10 MED ORDER — BUSPIRONE HCL 5 MG PO TABS
10.0000 mg | ORAL_TABLET | Freq: Two times a day (BID) | ORAL | Status: DC
  Administered 2017-08-10 – 2017-08-13 (×6): 10 mg via ORAL
  Filled 2017-08-10 (×7): qty 2

## 2017-08-10 MED ORDER — SODIUM CHLORIDE 0.9 % IV BOLUS (SEPSIS)
1000.0000 mL | Freq: Once | INTRAVENOUS | Status: AC
Start: 2017-08-10 — End: 2017-08-10
  Administered 2017-08-10: 1000 mL via INTRAVENOUS

## 2017-08-10 MED ORDER — VENLAFAXINE HCL ER 75 MG PO CP24
150.0000 mg | ORAL_CAPSULE | Freq: Every day | ORAL | Status: DC
  Administered 2017-08-11 – 2017-08-13 (×3): 150 mg via ORAL
  Filled 2017-08-10 (×3): qty 2

## 2017-08-10 MED ORDER — ONDANSETRON HCL 4 MG/2ML IJ SOLN: 4.0000 mg | Freq: Four times a day (QID) | INTRAMUSCULAR | Status: DC | PRN

## 2017-08-10 MED ORDER — BISACODYL 5 MG PO TBEC: 5.0000 mg | DELAYED_RELEASE_TABLET | Freq: Every day | ORAL | Status: DC | PRN

## 2017-08-10 MED ORDER — ALBUTEROL SULFATE (2.5 MG/3ML) 0.083% IN NEBU: 2.5000 mg | INHALATION_SOLUTION | RESPIRATORY_TRACT | Status: DC | PRN

## 2017-08-10 MED ORDER — PREGABALIN 50 MG PO CAPS
100.0000 mg | ORAL_CAPSULE | Freq: Two times a day (BID) | ORAL | Status: DC
  Administered 2017-08-10 – 2017-08-13 (×6): 100 mg via ORAL
  Filled 2017-08-10 (×6): qty 2

## 2017-08-10 MED ORDER — ATORVASTATIN CALCIUM 20 MG PO TABS
40.0000 mg | ORAL_TABLET | Freq: Every day | ORAL | Status: DC
  Administered 2017-08-11 – 2017-08-13 (×3): 40 mg via ORAL
  Filled 2017-08-10 (×3): qty 2

## 2017-08-10 MED ORDER — PANTOPRAZOLE SODIUM 40 MG PO TBEC
40.0000 mg | DELAYED_RELEASE_TABLET | Freq: Every day | ORAL | Status: DC
  Administered 2017-08-11 – 2017-08-13 (×3): 40 mg via ORAL
  Filled 2017-08-10 (×3): qty 1

## 2017-08-10 MED ORDER — PNEUMOCOCCAL VAC POLYVALENT 25 MCG/0.5ML IJ INJ
0.5000 mL | INJECTION | INTRAMUSCULAR | Status: AC
  Administered 2017-08-11: 0.5 mL via INTRAMUSCULAR
  Filled 2017-08-10: qty 0.5

## 2017-08-10 NOTE — ED Notes (Signed)
Patient transported to CT and X ray 

## 2017-08-10 NOTE — ED Triage Notes (Signed)
Pt arrived via EMS from [facility] for reports of increased dizziness for two days. Pt started taking Abilify two days ago. Pt denies NVD, denies pain.EMS reports VSS, NSR, CBG 269.

## 2017-08-10 NOTE — ED Provider Notes (Signed)
[facility] Emergency Department Provider Note ____________________________________________   First MD Initiated Contact with Patient 08/10/17 1650     (approximate)  I have reviewed the triage vital signs and the nursing notes.   HISTORY  Chief Complaint Dizziness  HPI [name] is a 76 y.o. female presents for evaluation of dizziness, feeling generally weak, slight confusion over the last 2 days  Patient reports for about 2 days she just felt off.  Today when she tried to walk she felt as though she had a kind of stumbling feeling or became very lightheaded whenever she go to stand and walk.  No nausea or vomiting.  Denies pain except for pain in her right upper arm which is been chronic for several months after she broke it previously.  No falls or injuries.  No speech changes, denies any numbness or weakness in any one arm or leg.  No headache.  No fevers or chills.  No chest pain or trouble breathing.  Does report that she did start taking Abilify recently, took the last dose last night however.  She is continued to feel very lightheaded and weak for the last couple of days.  Past Medical History:  Diagnosis Date  . Cancer ([place])   . Depression   . Diabetes mellitus without complication ([place])   . Hypercholesteremia   . Hypertension   . Osteoporosis     Patient Active Problem List   Diagnosis Date Noted  . ARF (acute renal failure) ([place]) 08/10/2017    Past Surgical History:  Procedure Laterality Date  . BREAST SURGERY      Prior to Admission medications   Medication Sig Start Date End Date Taking? Authorizing Provider  ARIPiprazole (ABILIFY) 2 MG tablet Take 1 tablet by mouth daily. 08/05/17  Yes [provider]  atorvastatin (LIPITOR) 40 MG tablet Take 40 mg by mouth daily. 07/25/17  Yes [provider]  buPROPion (WELLBUTRIN SR) 150 MG 12 hr tablet Take 1 tablet by mouth 2 (two) times daily. 07/25/17  Yes [provider]  busPIRone (BUSPAR) 10 MG tablet Take 1 tablet by mouth 2 (two) times daily. 07/25/17  Yes [provider]  furosemide (LASIX) 40 MG tablet Take 40 mg by mouth daily. 07/25/17  Yes [provider]  hydrochlorothiazide (MICROZIDE) 12.5 MG capsule Take 1 capsule by mouth daily. 07/25/17  Yes [provider]  lamoTRIgine (LAMICTAL) 200 MG tablet Take 1 tablet by mouth daily. 07/25/17  Yes [provider]  levothyroxine (SYNTHROID, LEVOTHROID) 150 MCG tablet Take 150 mcg by mouth daily. 07/25/17  Yes [provider]  losartan (COZAAR) 50 MG tablet Take 50 mg by mouth daily. 07/25/17  Yes [provider]  LYRICA 100 MG capsule Take 100 mg by mouth 2 (two) times daily. 07/31/17  Yes [provider]  meloxicam (MOBIC) 7.5 MG tablet Take 1 tablet by mouth daily. 07/25/17  Yes [provider]  omeprazole (PRILOSEC) 20 MG capsule Take 1 capsule by mouth daily. 07/25/17  Yes [provider]  oxyCODONE (OXY IR/ROXICODONE) 5 MG immediate release tablet Take 1 tablet by mouth 3 (three) times daily. 07/31/17  Yes [provider]  venlafaxine XR (EFFEXOR-XR) 150 MG 24 hr capsule Take 150 mg by mouth daily. 07/25/17  Yes [provider]  PROAIR HFA 108 (90 Base) MCG/ACT inhaler Take by mouth as directed. 07/31/17   [provider]    Allergies Patient has no known allergies.  Family History  Problem Relation Age of Onset  . Breast cancer Maternal Aunt     Social History Social History   Tobacco Use  . Smoking status: Never Smoker  Substance Use Topics  . Alcohol use: No  . Drug use: Not on file    Review of Systems Constitutional: No fever/chills Eyes: No visual changes. ENT: No sore throat. Cardiovascular: Denies chest pain. Respiratory: Denies shortness of breath. Gastrointestinal: No abdominal pain.  No nausea, no vomiting.  No diarrhea.  No constipation. Genitourinary: Negative  for dysuria. Musculoskeletal: Negative for back pain. Skin: Negative for rash. Neurological: Negative for headaches, focal weakness or numbness.  Feels generally weak throughout    ____________________________________________   PHYSICAL EXAM:  VITAL SIGNS: ED Triage Vitals  Enc Vitals Group     BP 08/10/17 1444 101/60     Pulse Rate 08/10/17 1444 73     Resp 08/10/17 1444 18     Temp 08/10/17 1444 98.3 F (36.8 C)     Temp Source 08/10/17 1444 Oral     SpO2 08/10/17 1444 97 %     Weight 08/10/17 1445 220 lb (99.8 kg)     Height --      Head Circumference --      Peak Flow --      Pain Score --      Pain Loc --      Pain Edu? --      Excl. in [place]? --     Constitutional: Alert and oriented to self and daughter but reports it is Wednesday, reports years 2020 and was not aware that tomorrow is Christmas.  Slightly somnolent but in no distress.  Eyes: Conjunctivae are normal. Head: Atraumatic. Nose: No congestion/rhinnorhea. Mouth/Throat: Mucous membranes are dry Neck: No stridor.  No meningismus or rigidity Cardiovascular: Normal rate, regular rhythm. Grossly normal heart sounds.  Good peripheral circulation. Respiratory: Normal respiratory effort.  No retractions. Lungs CTAB. Gastrointestinal: Soft and nontender. No distention. Musculoskeletal: No lower extremity tenderness nor edema. Neurologic:  Normal speech and language. No gross focal neurologic deficits are appreciated.  Normal strength in all extremities.  No pronator drift.  She is slightly somnolent, alerts easily to voice, but does appear generally fatigued. Skin:  Skin is warm, dry and intact. No rash noted. Psychiatric: Mood and affect are slightly flat. Speech and behavior are normal.  ____________________________________________   LABS (all labs ordered are listed, but only abnormal results are displayed)  Labs Reviewed  BASIC METABOLIC PANEL - Abnormal; Notable for the following components:      Result  Value   Glucose, Bld 157 (*)    BUN 24 (*)    Creatinine, Ser 1.48 (*)    GFR calc non Af Amer 34 (*)    GFR calc Af Amer 39 (*)    All other components within normal limits  CBC - Abnormal; Notable for the following components:   WBC 15.3 (*)    MCHC 31.5 (*)    RDW 14.7 (*)    All other components within normal limits  URINALYSIS, COMPLETE (UACMP) WITH MICROSCOPIC - Abnormal; Notable for the following components:   Color, Urine YELLOW (*)    APPearance CLEAR (*)    Squamous Epithelial / LPF 0-5 (*)    All other components within normal limits  LACTIC ACID, PLASMA - Abnormal; Notable for the following components:   Lactic Acid, Venous 2.7 (*)    All other components within normal limits  HEPATIC FUNCTION PANEL - Abnormal;  Notable for the following components:   Indirect Bilirubin 1.0 (*)    All other components within normal limits  CULTURE, BLOOD (ROUTINE X 2)  CULTURE, BLOOD (ROUTINE X 2)  URINE CULTURE  LIPASE, BLOOD  TSH  TROPONIN I  AMMONIA  LACTIC ACID, PLASMA   ____________________________________________  EKG  Reviewed and are by me at 1445 Heart rate 65 QRS 99 QTC 440 Normal sinus rhythm, inferior Q waves, no evidence of acute ischemic change Left axis ____________________________________________  RADIOLOGY  Dg Chest 2 View  Result Date: 08/10/2017 CLINICAL DATA:  76 year old female with confusion and leukocytosis. EXAM: CHEST  2 VIEW COMPARISON:  Chest radiograph dated 07/19/2015 FINDINGS: There is shallow inspiration with bibasilar atelectatic changes. Developing infiltrate is less likely but not excluded. Clinical correlation is recommended. There is no focal consolidation, pleural effusion, or pneumothorax. Top-normal cardiac size. Right chest wall/ axillary surgical clips. There is osteopenia with degenerative changes of the spine and shoulders. No acute osseous pathology. IMPRESSION: Shallow inspiration with bibasilar atelectasis. No focal  consolidation. Electronically Signed   By: Anner Crete M.D.   On: 08/10/2017 18:14   Ct Head Wo Contrast  Result Date: 08/10/2017 CLINICAL DATA:  Dizziness. EXAM: CT HEAD WITHOUT CONTRAST TECHNIQUE: Contiguous axial images were obtained from the base of the skull through the vertex without intravenous contrast. COMPARISON:  11/09/2014 FINDINGS: Brain: No evidence of acute infarction, hemorrhage, hydrocephalus, extra-axial collection or mass lesion/mass effect. Vascular: No hyperdense vessel or unexpected calcification. Skull: Normal. Negative for fracture or focal lesion. Sinuses/Orbits: No acute finding. Other: None. IMPRESSION: Normal head CT.  No acute findings. Electronically Signed   By: Aletta Edouard M.D.   On: 08/10/2017 17:54    Chest x-ray reviewed, no acute.  CT the head, no acute ____________________________________________   PROCEDURES  Procedure(s) performed: None  Procedures  Critical Care performed: No  ____________________________________________   INITIAL IMPRESSION / ASSESSMENT AND PLAN / ED COURSE  Pertinent labs & imaging results that were available during my care of the patient were reviewed by me and considered in my medical decision making (see chart for details).  Patient presents for evaluation of somnolence, mild hypotension, labs concerning for acute kidney injury with notable leukocytosis without a clear obvious source.  She denies any acute cardiac or pulmonary symptoms.  No acute neurologic symptoms which are focal, but her mental status is somnolent.  Possibly related to new medication, though somewhat unclear.  In addition, the patient denies any significant discomfort, she has chronic right arm pain without abnormality noted on exam.  She also has no abdominal pain.  No obvious infectious etiology.  Will initiate toxic metabolic, cardiac and neurologic workup in the ED.  ----------------------------------------- 9:19 PM on  08/10/2017 -----------------------------------------  Patient blood pressure improved, resting comfortably without distress but still somewhat somnolent.  Discussed the hospitalist, will admit the patient for observation as she does demonstrate dehydration and acute kidney injury with elevated lactic acidosis and elevated white count.  However, no source is noted to indicate this is obvious infectious etiology.  Further workup and evaluation under the hospitalist service.  Patient's blood pressure is improved, but does remain somewhat somnolent, but arousable enough that she is able to hold a phone and call family on it.        ____________________________________________   FINAL CLINICAL IMPRESSION(S) / ED DIAGNOSES  Final diagnoses:  AKI (acute kidney injury) ([place])  Dehydration  Dizziness      NEW MEDICATIONS STARTED DURING THIS VISIT:  This SmartLink is deprecated. Use AVSMEDLIST instead to display the medication list for a patient.   Note:  This document was prepared using Dragon voice recognition software and may include unintentional dictation errors.     Delman Kitten, MD 08/10/17 2121

## 2017-08-10 NOTE — H&P (Addendum)
[facility] [place] NAME: Melanie Young    MR#:  [id number]  DATE OF BIRTH:  06/25/1941  DATE OF ADMISSION:  08/10/2017  PRIMARY CARE PHYSICIAN: System, Pcp Not In   REQUESTING/REFERRING PHYSICIAN: Delman Kitten, MD  CHIEF COMPLAINT:   Chief Complaint  Patient presents with  . Dizziness   Dizziness and generalized weakness today. HISTORY OF PRESENT ILLNESS:  Melanie Young  is a 76 y.o. female with a known history of multiple medical problems as below's.  The patient presents the ED with dizziness and generalized weakness.  She denies any fever or chills, no dysuria or urinary frequency.  She only has mild cough but denies any other symptoms.  She was found dehydration, lactic acidosis and leukocytosis.  Chest x-ray is unremarkable and urinalysis is unremarkable. ED physician requested admission. PAST MEDICAL HISTORY:   Past Medical History:  Diagnosis Date  . Cancer ([place])   . Depression   . Diabetes mellitus without complication ([place])   . Hypercholesteremia   . Hypertension   . Osteoporosis     PAST SURGICAL HISTORY:   Past Surgical History:  Procedure Laterality Date  . BREAST SURGERY      SOCIAL HISTORY:   Social History   Tobacco Use  . Smoking status: Never Smoker  Substance Use Topics  . Alcohol use: No    FAMILY HISTORY:   Family History  Problem Relation Age of Onset  . Breast cancer Maternal Aunt     DRUG ALLERGIES:  No Known Allergies  REVIEW OF SYSTEMS:   Review of Systems  Constitutional: Positive for malaise/fatigue. Negative for chills and fever.  HENT: Negative for sore throat.   Eyes: Negative for blurred vision and double vision.  Respiratory: Negative for cough, hemoptysis, shortness of breath, wheezing and stridor.   Cardiovascular: Negative for chest pain, palpitations, orthopnea and leg swelling.  Gastrointestinal: Negative for abdominal pain, blood in stool, diarrhea, melena, nausea  and vomiting.  Genitourinary: Negative for dysuria, flank pain and hematuria.  Musculoskeletal: Negative for back pain and joint pain.  Skin: Negative for rash.  Neurological: Positive for dizziness and weakness. Negative for sensory change, focal weakness, seizures, loss of consciousness and headaches.  Endo/Heme/Allergies: Negative for polydipsia.  Psychiatric/Behavioral: Negative for depression. The patient is not nervous/anxious.     MEDICATIONS AT HOME:   Prior to Admission medications   Medication Sig Start Date End Date Taking? Authorizing Provider  ARIPiprazole (ABILIFY) 2 MG tablet Take 1 tablet by mouth daily. 08/05/17   [provider]  atorvastatin (LIPITOR) 40 MG tablet Take 40 mg by mouth daily. 07/25/17   [provider]  buPROPion (WELLBUTRIN SR) 150 MG 12 hr tablet Take 1 tablet by mouth 2 (two) times daily. 07/25/17   [provider]  busPIRone (BUSPAR) 10 MG tablet Take 1 tablet by mouth 2 (two) times daily. 07/25/17   [provider]  furosemide (LASIX) 40 MG tablet Take 40 mg by mouth daily. 07/25/17   [provider]  hydrochlorothiazide (MICROZIDE) 12.5 MG capsule Take 1 capsule by mouth daily. 07/25/17   [provider]  lamoTRIgine (LAMICTAL) 200 MG tablet Take 1 tablet by mouth daily. 07/25/17   [provider]  levothyroxine (SYNTHROID, LEVOTHROID) 150 MCG tablet Take 150 mcg by mouth daily. 07/25/17   [provider]  losartan (COZAAR) 50 MG tablet Take 50 mg by mouth daily. 07/25/17   [provider]  LYRICA 100 MG capsule  Take 100 mg by mouth 2 (two) times daily. 07/31/17   [provider]  meloxicam (MOBIC) 7.5 MG tablet Take 1 tablet by mouth daily. 07/25/17   [provider]  omeprazole (PRILOSEC) 20 MG capsule Take 1 capsule by mouth daily. 07/25/17   [provider]  oxyCODONE (OXY IR/ROXICODONE) 5 MG immediate release tablet Take 1 tablet by mouth 3 (three)  times daily. 07/31/17   [provider]  PROAIR HFA 108 (90 Base) MCG/ACT inhaler Take by mouth as directed. 07/31/17   [provider]  venlafaxine XR (EFFEXOR-XR) 150 MG 24 hr capsule Take 150 mg by mouth daily. 07/25/17   [provider]      VITAL SIGNS:  Blood pressure 133/64, pulse 89, temperature 98.3 F (36.8 C), temperature source Oral, resp. rate 18, weight 220 lb (99.8 kg), SpO2 94 %.  PHYSICAL EXAMINATION:  Physical Exam  GENERAL:  76 y.o.-year-old patient lying in the bed with no acute distress.  Obesity. EYES: Pupils equal, round, reactive to light and accommodation. No scleral icterus. Extraocular muscles intact.  HEENT: Head atraumatic, normocephalic. Oropharynx and nasopharynx clear.  NECK:  Supple, no jugular venous distention. No thyroid enlargement, no tenderness.  LUNGS: Normal breath sounds bilaterally, no wheezing, rales,rhonchi or crepitation. No use of accessory muscles of respiration.  CARDIOVASCULAR: S1, S2 normal. No murmurs, rubs, or gallops.  ABDOMEN: Soft, nontender, nondistended. Bowel sounds present. No organomegaly or mass.  EXTREMITIES: No pedal edema, cyanosis, or clubbing.  NEUROLOGIC: Cranial nerves II through XII are intact. Muscle strength 5/5 in all extremities. Sensation intact. Gait not checked.  PSYCHIATRIC: The patient is alert and oriented x 3.  SKIN: No obvious rash, lesion, or ulcer.   LABORATORY PANEL:   CBC Recent Labs  Lab 08/10/17 1507  WBC 15.3*  HGB 13.0  HCT 41.3  PLT 190   ------------------------------------------------------------------------------------------------------------------  Chemistries  Recent Labs  Lab 08/10/17 1507  NA 135  K 4.1  CL 101  CO2 28  GLUCOSE 157*  BUN 24*  CREATININE 1.48*  CALCIUM 9.0  AST 33  ALT 25  ALKPHOS 113  BILITOT 1.1   ------------------------------------------------------------------------------------------------------------------  Cardiac  Enzymes Recent Labs  Lab 08/10/17 1507  TROPONINI <0.03   ------------------------------------------------------------------------------------------------------------------  RADIOLOGY:  Dg Chest 2 View  Result Date: 08/10/2017 CLINICAL DATA:  76 year old female with confusion and leukocytosis. EXAM: CHEST  2 VIEW COMPARISON:  Chest radiograph dated 07/19/2015 FINDINGS: There is shallow inspiration with bibasilar atelectatic changes. Developing infiltrate is less likely but not excluded. Clinical correlation is recommended. There is no focal consolidation, pleural effusion, or pneumothorax. Top-normal cardiac size. Right chest wall/ axillary surgical clips. There is osteopenia with degenerative changes of the spine and shoulders. No acute osseous pathology. IMPRESSION: Shallow inspiration with bibasilar atelectasis. No focal consolidation. Electronically Signed   By: Anner Crete M.D.   On: 08/10/2017 18:14   Ct Head Wo Contrast  Result Date: 08/10/2017 CLINICAL DATA:  Dizziness. EXAM: CT HEAD WITHOUT CONTRAST TECHNIQUE: Contiguous axial images were obtained from the base of the skull through the vertex without intravenous contrast. COMPARISON:  11/09/2014 FINDINGS: Brain: No evidence of acute infarction, hemorrhage, hydrocephalus, extra-axial collection or mass lesion/mass effect. Vascular: No hyperdense vessel or unexpected calcification. Skull: Normal. Negative for fracture or focal lesion. Sinuses/Orbits: No acute finding. Other: None. IMPRESSION: Normal head CT.  No acute findings. Electronically Signed   By: Aletta Edouard M.D.   On: 08/10/2017 17:54      IMPRESSION AND PLAN:  Acute renal failure due to dehydration. The patient will be placed for observation. Start IV fluids support and follow-up BMP.  Hold Lasix, losartan, HCTZ and meloxicam.  Leukocytosis.  Possible due to dehydration.  Follow-up CBC.  Lactic acidosis.  Continue treatment as above.  Follow-up lactic acid  level.  Diabetes.  Start sliding scale. Hypertension.  Continue Lopressor.  All the records are reviewed and case discussed with ED provider. Management plans discussed with the patient, family and they are in agreement.  CODE STATUS: Full code  TOTAL TIME TAKING CARE OF THIS PATIENT: 53 minutes.    Demetrios Loll M.D on 08/10/2017 at 8:45 PM  Between 7am to 6pm - Pager - [phone]  After 6pm go to [email or web address] - Proofreader  [facility]  Office  [phone]  CC: Primary care physician; System, Pcp Not In   Note: This dictation was prepared with Dragon dictation along with smaller phrase technology. Any transcriptional errors that result from this process are unin

## 2017-08-10 NOTE — ED Notes (Signed)
Pt up to toilet 

## 2017-08-10 NOTE — ED Notes (Signed)
Caryl Pina RN, aware of recollect needed for ammonia

## 2017-08-10 NOTE — ED Notes (Signed)
Tyrell Brereton (Daughter): [phone] (cell) [phone] (home)  Please call with updates for plan of care.

## 2017-08-11 LAB — CBC
HCT: 36.3 % (ref 35.0–47.0)
Hemoglobin: 11.5 g/dL — ABNORMAL LOW (ref 12.0–16.0)
MCH: 27.9 pg (ref 26.0–34.0)
MCHC: 31.7 g/dL — AB (ref 32.0–36.0)
MCV: 88 fL (ref 80.0–100.0)
PLATELETS: 166 10*3/uL (ref 150–440)
RBC: 4.12 MIL/uL (ref 3.80–5.20)
RDW: 14.1 % (ref 11.5–14.5)
WBC: 12.3 10*3/uL — ABNORMAL HIGH (ref 3.6–11.0)

## 2017-08-11 LAB — BASIC METABOLIC PANEL
Anion gap: 6 (ref 5–15)
BUN: 18 mg/dL (ref 6–20)
CALCIUM: 8.1 mg/dL — AB (ref 8.9–10.3)
CO2: 25 mmol/L (ref 22–32)
CREATININE: 0.88 mg/dL (ref 0.44–1.00)
Chloride: 105 mmol/L (ref 101–111)
GFR calc Af Amer: >60 mL/min (ref >60)
GLUCOSE: 209 mg/dL — AB (ref 65–99)
Potassium: 4 mmol/L (ref 3.5–5.1)
Sodium: 136 mmol/L (ref 135–145)

## 2017-08-11 LAB — LACTIC ACID, PLASMA: LACTIC ACID, VENOUS: 0.7 mmol/L (ref 0.5–1.9)

## 2017-08-11 LAB — GLUCOSE, CAPILLARY
GLUCOSE-CAPILLARY: 156 mg/dL — AB (ref 65–99)
Glucose-Capillary: 151 mg/dL — ABNORMAL HIGH (ref 65–99)
Glucose-Capillary: 165 mg/dL — ABNORMAL HIGH (ref 65–99)

## 2017-08-11 MED ORDER — INSULIN ASPART 100 UNIT/ML ~~LOC~~ SOLN
0.0000 [IU] | Freq: Three times a day (TID) | SUBCUTANEOUS | Status: DC
  Administered 2017-08-11: 1 [IU] via SUBCUTANEOUS
  Administered 2017-08-11 – 2017-08-12 (×3): 2 [IU] via SUBCUTANEOUS
  Administered 2017-08-12: 3 [IU] via SUBCUTANEOUS
  Administered 2017-08-13: 1 [IU] via SUBCUTANEOUS
  Administered 2017-08-13: 3 [IU] via SUBCUTANEOUS
  Filled 2017-08-11 (×7): qty 1

## 2017-08-11 MED ORDER — INSULIN ASPART 100 UNIT/ML ~~LOC~~ SOLN: 0.0000 [IU] | Freq: Every day | SUBCUTANEOUS | Status: DC

## 2017-08-11 NOTE — Progress Notes (Signed)
[facility] [place] NAME: Melanie Young    MR#:  [id number]  DATE OF BIRTH:  October 25, 1940  SUBJECTIVE:  CHIEF COMPLAINT:   Chief Complaint  Patient presents with  . Dizziness   Generalized weakness. REVIEW OF SYSTEMS:  Review of Systems  Constitutional: Positive for malaise/fatigue. Negative for chills and fever.  HENT: Negative for sore throat.   Eyes: Negative for blurred vision and double vision.  Respiratory: Negative for cough, hemoptysis, shortness of breath, wheezing and stridor.   Cardiovascular: Negative for chest pain, palpitations, orthopnea and leg swelling.  Gastrointestinal: Negative for abdominal pain, blood in stool, diarrhea, melena, nausea and vomiting.  Genitourinary: Negative for dysuria, flank pain and hematuria.  Musculoskeletal: Negative for back pain and joint pain.  Skin: Negative for rash.  Neurological: Positive for weakness. Negative for dizziness, sensory change, focal weakness, seizures, loss of consciousness and headaches.  Endo/Heme/Allergies: Negative for polydipsia.  Psychiatric/Behavioral: Negative for depression. The patient is not nervous/anxious.     DRUG ALLERGIES:  No Known Allergies VITALS:  Blood pressure 112/71, pulse 70, temperature 98.6 F (37 C), temperature source Oral, resp. rate 20, height 5\' 2"  (1.575 m), weight 248 lb 11.2 oz (112.8 kg), SpO2 99 %. PHYSICAL EXAMINATION:  Physical Exam  Constitutional: She is oriented to person, place, and time and well-developed, well-nourished, and in no distress.  Obesity.  HENT:  Head: Normocephalic.  Mouth/Throat: Oropharynx is clear and moist.  Eyes: Conjunctivae and EOM are normal. Pupils are equal, round, and reactive to light. No scleral icterus.  Neck: Normal range of motion. Neck supple. No JVD present. No tracheal deviation present.  Cardiovascular: Normal rate, regular rhythm and normal heart sounds. Exam reveals no gallop.  No  murmur heard. Pulmonary/Chest: Effort normal and breath sounds normal. No respiratory distress. She has no wheezes. She has no rales.  Abdominal: Soft. Bowel sounds are normal. She exhibits no distension. There is no tenderness. There is no rebound.  Musculoskeletal: Normal range of motion. She exhibits no edema or tenderness.  Neurological: She is alert and oriented to person, place, and time. No cranial nerve deficit.  Skin: No rash noted. No erythema.  Psychiatric: Affect normal.   LABORATORY PANEL:  Female CBC Recent Labs  Lab 08/11/17 0345  WBC 12.3*  HGB 11.5*  HCT 36.3  PLT 166   ------------------------------------------------------------------------------------------------------------------ Chemistries  Recent Labs  Lab 08/10/17 1507 08/11/17 0345  NA 135 136  K 4.1 4.0  CL 101 105  CO2 28 25  GLUCOSE 157* 209*  BUN 24* 18  CREATININE 1.48* 0.88  CALCIUM 9.0 8.1*  AST 33  --   ALT 25  --   ALKPHOS 113  --   BILITOT 1.1  --    RADIOLOGY:  Dg Chest 2 View  Result Date: 08/10/2017 CLINICAL DATA:  76 year old female with confusion and leukocytosis. EXAM: CHEST  2 VIEW COMPARISON:  Chest radiograph dated 07/19/2015 FINDINGS: There is shallow inspiration with bibasilar atelectatic changes. Developing infiltrate is less likely but not excluded. Clinical correlation is recommended. There is no focal consolidation, pleural effusion, or pneumothorax. Top-normal cardiac size. Right chest wall/ axillary surgical clips. There is osteopenia with degenerative changes of the spine and shoulders. No acute osseous pathology. IMPRESSION: Shallow inspiration with bibasilar atelectasis. No focal consolidation. Electronically Signed   By: Anner Crete M.D.   On: 08/10/2017 18:14   Ct Head Wo Contrast  Result Date: 08/10/2017 CLINICAL DATA:  Dizziness. EXAM: CT  HEAD WITHOUT CONTRAST TECHNIQUE: Contiguous axial images were obtained from the base of the skull through the vertex  without intravenous contrast. COMPARISON:  11/09/2014 FINDINGS: Brain: No evidence of acute infarction, hemorrhage, hydrocephalus, extra-axial collection or mass lesion/mass effect. Vascular: No hyperdense vessel or unexpected calcification. Skull: Normal. Negative for fracture or focal lesion. Sinuses/Orbits: No acute finding. Other: None. IMPRESSION: Normal head CT.  No acute findings. Electronically Signed   By: Aletta Edouard M.D.   On: 08/10/2017 17:54   ASSESSMENT AND PLAN:  The patient presents the ED with dizziness and generalized weakness.    Acute renal failure due to dehydration. Improved with IV fluids support, Hold Lasix,  HCTZ and meloxicam.  Leukocytosis.  Possible due to dehydration.    Improved.  Lactic acidosis.  Continue treatment as above.    Improved  Diabetes.  on sliding scale. Hypertension.  Continue Lopressor. Resume losartan after discharge.  Obesity.  Generalized weakness.  The patient is very weak, waiting for PT evaluation.  But there is no PT service today.  All the records are reviewed and case discussed with Care Management/Social Worker. Management plans discussed with the patient, family and they are in agreement.  CODE STATUS: Full Code  TOTAL TIME TAKING CARE OF THIS PATIENT: 27 minutes.   More than 50% of the time was spent in counseling/coordination of care: YES  POSSIBLE D/C IN 1 DAYS, DEPENDING ON CLINICAL CONDITION.   Demetrios Loll M.D on 08/11/2017 at 11:16 AM  Between 7am to 6pm - Pager - [phone]  After 6pm go to [email or web address] - [facility]

## 2017-08-11 NOTE — Discharge Instructions (Signed)
Heart healthy and ADA diet. °

## 2017-08-12 ENCOUNTER — Encounter: Payer: Self-pay | Admitting: *Deleted

## 2017-08-12 LAB — GLUCOSE, CAPILLARY
GLUCOSE-CAPILLARY: 145 mg/dL — AB (ref 65–99)
GLUCOSE-CAPILLARY: 162 mg/dL — AB (ref 65–99)
Glucose-Capillary: 211 mg/dL — ABNORMAL HIGH (ref 65–99)
Glucose-Capillary: 113 mg/dL — ABNORMAL HIGH (ref 65–99)
Glucose-Capillary: 178 mg/dL — ABNORMAL HIGH (ref 65–99)

## 2017-08-12 LAB — URINE CULTURE
CULTURE: NO GROWTH
Special Requests: NORMAL

## 2017-08-12 MED ORDER — ASPIRIN 81 MG PO CHEW
81.0000 mg | CHEWABLE_TABLET | Freq: Every day | ORAL | Status: DC
  Administered 2017-08-12 – 2017-08-13 (×2): 81 mg via ORAL
  Filled 2017-08-12 (×2): qty 1

## 2017-08-12 MED ORDER — ASPIRIN 81 MG PO CHEW: 81.0000 mg | CHEWABLE_TABLET | Freq: Every day | ORAL | 1 refills | Status: DC

## 2017-08-12 MED ORDER — LOSARTAN POTASSIUM 50 MG PO TABS
50.0000 mg | ORAL_TABLET | Freq: Every day | ORAL | Status: DC
  Administered 2017-08-12 – 2017-08-13 (×2): 50 mg via ORAL
  Filled 2017-08-12 (×2): qty 1

## 2017-08-12 MED ORDER — METOPROLOL TARTRATE 25 MG PO TABS: 25.0000 mg | ORAL_TABLET | Freq: Two times a day (BID) | ORAL | 1 refills | Status: DC

## 2017-08-12 MED ORDER — LOSARTAN POTASSIUM 50 MG PO TABS: 50.0000 mg | ORAL_TABLET | Freq: Every day | ORAL | Status: DC

## 2017-08-12 MED ORDER — METOPROLOL TARTRATE 25 MG PO TABS: 25.0000 mg | ORAL_TABLET | Freq: Two times a day (BID) | ORAL | Status: DC

## 2017-08-12 MED ORDER — METOPROLOL TARTRATE 25 MG PO TABS
25.0000 mg | ORAL_TABLET | Freq: Two times a day (BID) | ORAL | Status: DC
  Administered 2017-08-12 (×2): 25 mg via ORAL
  Filled 2017-08-12 (×2): qty 1

## 2017-08-12 NOTE — Plan of Care (Signed)
Patient is progressing but has weakness.  Will be evaluated by PT and placement will be determined.  Melanie Young

## 2017-08-12 NOTE — Evaluation (Signed)
Physical Therapy Evaluation Patient Details Name: Melanie Young MRN: [id number] DOB: 1940/11/22 Today's Date: 08/12/2017   History of Present Illness  Pt is a 76 y.o. female presenting to hospital with dizziness, weakness, and slight confusion x2 days.  Pt admitted with acute renal failure d/t dehydration.  PMH includes chronic R UE pain (h/of fx proximal R humerus 10/19/15), CA, depression, DM, htn, ARF, breast surgery.  Clinical Impression  Prior to hospital admission, pt was ambulatory and lives at [facility].  Currently pt is max assist with bed mobility and 2 assist with standing with RW and attempting to take steps with RW.  Pt A&O x4 but required increased time to respond to vc's during session; also demonstrating intermittent posterior lean with sitting and standing during session requiring assist to correct.  Pt would benefit from skilled PT to address noted impairments and functional limitations (see below for any additional details).  Currently pt appears below baseline in terms of functional mobility.  Upon hospital discharge, recommend pt discharge to [facility].    Follow Up Recommendations SNF    Equipment Recommendations  Rolling walker with 5" wheels    Recommendations for Other Services       Precautions / Restrictions Precautions Precautions: Fall Restrictions Weight Bearing Restrictions: No Other Position/Activity Restrictions: H/o R UE proximal humerus fx 10/19/15      Mobility  Bed Mobility Overal bed mobility: Needs Assistance Bed Mobility: Supine to Sit;Sit to Supine     Supine to sit: Max assist;HOB elevated Sit to supine: Max assist;HOB elevated   General bed mobility comments: assist for trunk and B LE's supine to/from sit; increased effort and time to perform with vc's required for technique and use of L bedrail with L UE  Transfers Overall transfer level: Needs assistance Equipment used: Rolling walker (2 wheeled) Transfers: Sit to/from Stand Sit to  Stand: Mod assist;Max assist;+2 physical assistance         General transfer comment: 1st trial max assist x2 (pt's feet slipping forward and with posterior lean noted); 2nd and 3rd trial mod to max assist x2 (B feet blocked and vc's given to shift weight forward when standing but pt still with some posterior lean standing)  Ambulation/Gait Ambulation/Gait assistance: Mod assist;+2 physical assistance Ambulation Distance (Feet): (see gait details below) Assistive device: Rolling walker (2 wheeled)   Gait velocity: decreased   General Gait Details: pt able to move B LE's (with assist for weight shifting and use of RW) x2 repetitions in place and then take steps forward and backward x2 trials (pt sliding feet on ground with increased effort and time)  Stairs            Wheelchair Mobility    Modified Rankin (Stroke Patients Only)       Balance Overall balance assessment: Needs assistance Sitting-balance support: Bilateral upper extremity supported;Feet supported Sitting balance-Leahy Scale: Poor Sitting balance - Comments: Pt intermittently falling backwards requiring vc's and mod assist to correct but improved to close SBA with time and cueing Postural control: Posterior lean Standing balance support: Bilateral upper extremity supported Standing balance-Leahy Scale: Poor Standing balance comment: pt with initial posterior lean standing requiring assist to shift weight forward (improved to CGA x2 with RW once positioned)                             Pertinent Vitals/Pain Pain Assessment: 0-10 Pain Score: 8  Pain Location: R UE chronic proximal humerus  pain Pain Descriptors / Indicators: Constant;Sore;Tender Pain Intervention(s): Limited activity within patient's tolerance;Monitored during session;Repositioned(RN notified)  Vitals (HR and O2 on room air) stable and WFL throughout treatment session.    Home Living Family/patient expects to be discharged to::  Assisted living               Home Equipment: Gilford Rile - 2 wheels Additional Comments: Per chart lives at [facility]    Prior Function Level of Independence: Needs assistance   Gait / Transfers Assistance Needed: Pt reports ambulating short distances with RW.  Pt reports 3 falls in past 6 months.  Per facility, pt was ambulatory prior to admission.  ADL's / Homemaking Assistance Needed: Assist for cleaning, showers, meals, and medications.        Hand Dominance        Extremity/Trunk Assessment   Upper Extremity Assessment Upper Extremity Assessment: LUE deficits/detail(R UE AROM flexion to grossly 70 degrees; at least 3/5 AROM R UE elbow flexion/extension; fair R hand grip; L UE demonstrating good hand grip and generalized weakness (at least 3/5 AROM elbow flexion/extension but did not follow cues for MMT)) LUE Deficits / Details: L UE AROM flexion to grossly 90 degrees    Lower Extremity Assessment Lower Extremity Assessment: Generalized weakness    Cervical / Trunk Assessment Cervical / Trunk Assessment: Kyphotic  Communication   Communication: (Increased time to respond to questions at times)  Cognition Arousal/Alertness: Awake/alert Behavior During Therapy: Flat affect Overall Cognitive Status: No family/caregiver present to determine baseline cognitive functioning(Oriented to person, place, time, and situation)                                 General Comments: Increased time required to respond to cueing noted      General Comments General comments (skin integrity, edema, etc.): Pt just finishing breakfast upon PT arrival.  Nursing cleared pt for participation in physical therapy.  Pt agreeable to PT session.    Exercises  Transfer training   Assessment/Plan    PT Assessment Patient needs continued PT services  PT Problem List Decreased strength;Decreased activity tolerance;Decreased balance;Decreased mobility;Pain       PT Treatment  Interventions DME instruction;Gait training;Functional mobility training;Therapeutic activities;Therapeutic exercise;Balance training;Patient/family education    PT Goals (Current goals can be found in the Care Plan section)  Acute Rehab PT Goals Patient Stated Goal: to get strong and be able to walk again PT Goal Formulation: With patient Time For Goal Achievement: 08/26/17 Potential to Achieve Goals: Good    Frequency Min 2X/week   Barriers to discharge Decreased caregiver support      Co-evaluation               AM-PAC PT "6 Clicks" Daily Activity  Outcome Measure Difficulty turning over in bed (including adjusting bedclothes, sheets and blankets)?: Unable Difficulty moving from lying on back to sitting on the side of the bed? : Unable Difficulty sitting down on and standing up from a chair with arms (e.g., wheelchair, bedside commode, etc,.)?: Unable Help needed moving to and from a bed to chair (including a wheelchair)?: Total Help needed walking in hospital room?: Total Help needed climbing 3-5 steps with a railing? : Total 6 Click Score: 6    End of Session Equipment Utilized During Treatment: Gait belt Activity Tolerance: Patient limited by fatigue Patient left: in bed;with call bell/phone within reach;with bed alarm set Nurse Communication: Mobility status;Precautions;Patient  requests pain meds PT Visit Diagnosis: Other abnormalities of gait and mobility (R26.89);Muscle weakness (generalized) (M62.81);History of falling (Z91.81)    Time: 2103-1281 PT Time Calculation (min) (ACUTE ONLY): 29 min   Charges:   PT Evaluation $PT Eval Low Complexity: 1 Low PT Treatments $Therapeutic Activity: 8-22 mins   PT G Codes:   PT G-Codes **NOT FOR INPATIENT CLASS** Functional Assessment Tool Used: AM-PAC 6 Clicks Basic Mobility Functional Limitation: Mobility: Walking and moving around Mobility: Walking and Moving Around Current Status (V8867): 100 percent impaired,  limited or restricted Mobility: Walking and Moving Around Goal Status (R3736): At least 40 percent but less than 60 percent impaired, limited or restricted    Leitha Bleak, PT 08/12/17, 9:36 AM [phone]

## 2017-08-12 NOTE — Clinical Social Work Note (Signed)
Clinical Social Work Assessment  Patient Details  Name: Melanie Young MRN: [id number] Date of Birth: 1940/11/06  Date of referral:  08/12/17               Reason for consult:  Facility Placement                Permission sought to share information with:  Facility Sport and exercise psychologist Permission granted to share information::  Yes, Verbal Permission Granted  Name::     Marlaya, [place]   [phone] or Joan Mayans Sister   [phone] or Tama Gander   [phone]   Agency::  SNF admissions  Relationship::     Contact Information:     Housing/Transportation Living arrangements for the past 2 months:  Assisted Living Facility([facility]) Source of Information:  Patient Patient Interpreter Needed:  None Criminal Activity/Legal Involvement Pertinent to Current Situation/Hospitalization:  No - Comment as needed Significant Relationships:  Adult Children, Other Family Members Lives with:  Facility Resident Do you feel safe going back to the place where you live?  No Need for family participation in patient care:  No (Coment)  Care giving concerns:  Patient feels she needs some short term rehab before she is able to return back to ALF.   Social Worker assessment / plan:  Patient is a 76 year old female who is alert and oriented x4 and from [facility].  Patient has been at ALF for about 8 months, patient states she is happy with the care that is being provided.  Patient states she does not have any issues with ALF.  CSW explained to patient the process for looking for SNF and how insurance will pay for stay.  Patient states she has been to rehab in the past, and is familiar with the process and what to expect.  Patient gave CSW permission to begin bed search in [place].  Employment status:  Retired Nurse, adult PT Recommendations:  [place] / Referral to community resources:  [place]  Patient/Family's Response to care:  Patient and family are agreeable to going to SNF for short term rehab.  Patient/Family's Understanding of and Emotional Response to Diagnosis, Current Treatment, and Prognosis:  Patient is hopeful that she will not have to be at [facility] for very long.  Emotional Assessment Appearance:  Appears stated age Attitude/Demeanor/Rapport:    Affect (typically observed):  Appropriate, Calm, Stable Orientation:  Oriented to Place, Oriented to Self, Oriented to  Time, Oriented to Situation Alcohol / Substance use:  Not Applicable Psych involvement (Current and /or in the community):  No (Comment)  Discharge Needs  Concerns to be addressed:  Care Coordination Readmission within the last 30 days:  No Current discharge risk:  Lack of support system Barriers to Discharge:  Awaiting State Approval ([place])   Ross Ludwig, LCSWA 08/12/2017, 5:34 PM

## 2017-08-12 NOTE — Progress Notes (Signed)
[facility] [place] NAME: Melanie Young    MR#:  [id number]  DATE OF BIRTH:  [birth year]  SUBJECTIVE:  CHIEF COMPLAINT:   Chief Complaint  Patient presents with  . Dizziness   Generalized weakness. REVIEW OF SYSTEMS:  Review of Systems  Constitutional: Positive for malaise/fatigue. Negative for chills and fever.  HENT: Negative for sore throat.   Eyes: Negative for blurred vision and double vision.  Respiratory: Negative for cough, hemoptysis, shortness of breath, wheezing and stridor.   Cardiovascular: Negative for chest pain, palpitations, orthopnea and leg swelling.  Gastrointestinal: Negative for abdominal pain, blood in stool, diarrhea, melena, nausea and vomiting.  Genitourinary: Negative for dysuria, flank pain and hematuria.  Musculoskeletal: Negative for back pain and joint pain.  Skin: Negative for rash.  Neurological: Positive for weakness. Negative for dizziness, sensory change, focal weakness, seizures, loss of consciousness and headaches.  Endo/Heme/Allergies: Negative for polydipsia.  Psychiatric/Behavioral: Negative for depression. The patient is not nervous/anxious.     DRUG ALLERGIES:  No Known Allergies VITALS:  Blood pressure 131/66, pulse 70, temperature 98.5 F (36.9 C), temperature source Oral, resp. rate 20, height 5\' 2"  (1.575 m), weight 248 lb 11.2 oz (112.8 kg), SpO2 99 %. PHYSICAL EXAMINATION:  Physical Exam  Constitutional: She is oriented to person, place, and time and well-developed, well-nourished, and in no distress.  Obesity.  HENT:  Head: Normocephalic.  Mouth/Throat: Oropharynx is clear and moist.  Eyes: Conjunctivae and EOM are normal. Pupils are equal, round, and reactive to light. No scleral icterus.  Neck: Normal range of motion. Neck supple. No JVD present. No tracheal deviation present.  Cardiovascular: Normal rate, regular rhythm and normal heart sounds. Exam reveals no gallop.  No  murmur heard. Pulmonary/Chest: Effort normal and breath sounds normal. No respiratory distress. She has no wheezes. She has no rales.  Abdominal: Soft. Bowel sounds are normal. She exhibits no distension. There is no tenderness. There is no rebound.  Musculoskeletal: Normal range of motion. She exhibits no edema or tenderness.  Neurological: She is alert and oriented to person, place, and time. No cranial nerve deficit.  Skin: No rash noted. No erythema.  Psychiatric: Affect normal.   LABORATORY PANEL:  Female CBC Recent Labs  Lab 08/11/17 0345  WBC 12.3*  HGB 11.5*  HCT 36.3  PLT 166   ------------------------------------------------------------------------------------------------------------------ Chemistries  Recent Labs  Lab 08/10/17 1507 08/11/17 0345  NA 135 136  K 4.1 4.0  CL 101 105  CO2 28 25  GLUCOSE 157* 209*  BUN 24* 18  CREATININE 1.48* 0.88  CALCIUM 9.0 8.1*  AST 33  --   ALT 25  --   ALKPHOS 113  --   BILITOT 1.1  --    RADIOLOGY:  No results found. ASSESSMENT AND PLAN:  The patient presents the ED with dizziness and generalized weakness.    Acute renal failure due to dehydration. Improved with IV fluids support, Hold Lasix,  HCTZ and meloxicam.  Leukocytosis.  Possible due to dehydration.    Improved.  Lactic acidosis.  Continue treatment as above.    Improved  Diabetes.  on sliding scale. Hypertension.  Continue Lopressor. Resume losartan after discharge.  Obesity.  Generalized weakness.  The patient is very weak, waiting for PT evaluation: SNF.  SW is working on placement. All the records are reviewed and case discussed with Care Management/Social Worker. Management plans discussed with the patient, family and they are in agreement.  CODE STATUS: Full Code  TOTAL TIME TAKING CARE OF THIS PATIENT: 25 minutes.   More than 50% of the time was spent in counseling/coordination of care: YES  POSSIBLE D/C IN 1 DAYS, DEPENDING ON CLINICAL  CONDITION.   Demetrios Loll M.D on 08/12/2017 at 2:09 PM  Between 7am to 6pm - Pager - [phone]  After 6pm go to [email or web address] - [place] [facility]

## 2017-08-12 NOTE — Care Management Obs Status (Signed)
[place] NOTIFICATION   Patient Details  Name: Arvie Villarruel MRN: [id number] Date of Birth: 06/16/1941   Medicare Observation Status Notification Given:  Yes    Shelbie Ammons, RN 08/12/2017, 11:03 AM

## 2017-08-12 NOTE — Clinical Social Work Note (Addendum)
CSW presented bed offers to patient and her sister, and they chose [facility].  CSW contacted [facility] and they can accept patient once the Passar number has been received.  Jones Broom. [place], MSW, [place]  08/12/2017 4:50 PM

## 2017-08-12 NOTE — NC FL2 (Signed)
[place] LEVEL OF CARE SCREENING TOOL     IDENTIFICATION  Patient Name: Melanie Young Birthdate: 1941/03/22 Sex: female Admission Date (Current Location): 08/10/2017  [place] and [place] Number:  Engineering geologist and Address:  [facility], [address] [place], [place] [address]      Provider Number: [id number]  Attending Physician Name and Address:  Demetrios Loll, MD  Relative Name and Phone Number:  Tama Gander   [phone]     Current Level of Care: Hospital Recommended Level of Care: [place] Prior Approval Number:    Date Approved/Denied:   PASRR Number: Pending  Discharge Plan: SNF    Current Diagnoses: Patient Active Problem List   Diagnosis Date Noted  . ARF (acute renal failure) ([place]) 08/10/2017    Orientation RESPIRATION BLADDER Height & Weight     Self, Time, Situation, Place  Normal Continent Weight: 248 lb 11.2 oz (112.8 kg) Height:  5\' 2"  (157.5 cm)  BEHAVIORAL SYMPTOMS/MOOD NEUROLOGICAL BOWEL NUTRITION STATUS      Continent Diet  AMBULATORY STATUS COMMUNICATION OF NEEDS Skin   Limited Assist Verbally Normal                       Personal Care Assistance Level of Assistance  Bathing, Feeding, Dressing Bathing Assistance: Limited assistance Feeding assistance: Independent Dressing Assistance: Limited assistance     Functional Limitations Info  Sight, Hearing, Speech Sight Info: Adequate Hearing Info: Adequate Speech Info: Adequate    SPECIAL CARE FACTORS FREQUENCY  PT (By licensed PT)     PT Frequency: 5x a week              Contractures Contractures Info: Not present    Additional Factors Info  Code Status, Allergies, Psychotropic, Insulin Sliding Scale Code Status Info: Full Code Allergies Info: NKA Psychotropic Info: ARIPiprazole (ABILIFY) tablet 2 mg and busPIRone (BUSPAR) tablet 10 mg and buPROPion (WELLBUTRIN SR) 12 hr tablet 150 mg and  venlafaxine XR (EFFEXOR-XR) 24 hr capsule 150 mg  Insulin Sliding Scale Info: insulin aspart (novoLOG) injection 0-5 Units 3x a day with meals       Current Medications (08/12/2017):  This is the current hospital active medication list Current Facility-Administered Medications  Medication Dose Route Frequency Provider Last Rate Last Dose  . acetaminophen (TYLENOL) tablet 650 mg  650 mg Oral Q6H PRN Demetrios Loll, MD   650 mg at 08/11/17 2022   Or  . acetaminophen (TYLENOL) suppository 650 mg  650 mg Rectal Q6H PRN Demetrios Loll, MD      . albuterol (PROVENTIL) (2.5 MG/3ML) 0.083% nebulizer solution 2.5 mg  2.5 mg Nebulization Q2H PRN Demetrios Loll, MD      . ARIPiprazole (ABILIFY) tablet 2 mg  2 mg Oral Daily Demetrios Loll, MD   2 mg at 08/12/17 0919  . aspirin chewable tablet 81 mg  81 mg Oral Daily Demetrios Loll, MD   81 mg at 08/12/17 0920  . atorvastatin (LIPITOR) tablet 40 mg  40 mg Oral Daily Demetrios Loll, MD   40 mg at 08/12/17 0920  . bisacodyl (DULCOLAX) EC tablet 5 mg  5 mg Oral Daily PRN Demetrios Loll, MD      . buPROPion [facility] SR) 12 hr tablet 150 mg  150 mg Oral BID Demetrios Loll, MD   150 mg at 08/12/17 0919  . busPIRone (BUSPAR) tablet 10 mg  10 mg Oral BID Demetrios Loll, MD   10  mg at 08/12/17 0919  . heparin injection 5,000 Units  5,000 Units Subcutaneous Q8H Demetrios Loll, MD   5,000 Units at 08/12/17 0502  . HYDROcodone-acetaminophen (NORCO/VICODIN) 5-325 MG per tablet 1-2 tablet  1-2 tablet Oral Q4H PRN Demetrios Loll, MD   2 tablet at 08/12/17 [phone]  . insulin aspart (novoLOG) injection 0-5 Units  0-5 Units Subcutaneous QHS Demetrios Loll, MD      . insulin aspart (novoLOG) injection 0-9 Units  0-9 Units Subcutaneous TID WC Demetrios Loll, MD   3 Units at 08/12/17 1203  . lamoTRIgine (LAMICTAL) tablet 200 mg  200 mg Oral Daily Demetrios Loll, MD   200 mg at 08/12/17 0920  . levothyroxine (SYNTHROID, LEVOTHROID) tablet 150 mcg  150 mcg Oral QAC breakfast Demetrios Loll, MD   150 mcg at 08/12/17 0747  . losartan  (COZAAR) tablet 50 mg  50 mg Oral Daily Demetrios Loll, MD   50 mg at 08/12/17 0920  . metoprolol tartrate (LOPRESSOR) tablet 25 mg  25 mg Oral BID Demetrios Loll, MD   25 mg at 08/12/17 0920  . ondansetron (ZOFRAN) tablet 4 mg  4 mg Oral Q6H PRN Demetrios Loll, MD       Or  . ondansetron [facility]) injection 4 mg  4 mg Intravenous Q6H PRN Demetrios Loll, MD      . pantoprazole (PROTONIX) EC tablet 40 mg  40 mg Oral Daily Demetrios Loll, MD   40 mg at 08/12/17 0919  . pregabalin (LYRICA) capsule 100 mg  100 mg Oral BID Demetrios Loll, MD   100 mg at 08/12/17 0919  . senna-docusate (Senokot-S) tablet 1 tablet  1 tablet Oral QHS PRN Demetrios Loll, MD      . venlafaxine XR [facility]) 24 hr capsule 150 mg  150 mg Oral Daily Demetrios Loll, MD   150 mg at 08/12/17 0920     Discharge Medications: Please see discharge summary for a list of discharge medications.  Relevant Imaging Results:  Relevant Lab Results:   Additional Information SSN [id number]  Ross Ludwig, [place]

## 2017-08-13 LAB — GLUCOSE, CAPILLARY
GLUCOSE-CAPILLARY: 212 mg/dL — AB (ref 65–99)
GLUCOSE-CAPILLARY: 116 mg/dL — AB (ref 65–99)
Glucose-Capillary: 148 mg/dL — ABNORMAL HIGH (ref 65–99)

## 2017-08-13 MED ORDER — CYCLOBENZAPRINE HCL 10 MG PO TABS
5.0000 mg | ORAL_TABLET | Freq: Once | ORAL | Status: AC
  Administered 2017-08-13: 5 mg via ORAL
  Filled 2017-08-13: qty 1

## 2017-08-13 MED ORDER — HYDROCHLOROTHIAZIDE 12.5 MG PO CAPS
12.5000 mg | ORAL_CAPSULE | Freq: Every day | ORAL | Status: DC
  Administered 2017-08-13: 12.5 mg via ORAL
  Filled 2017-08-13: qty 1

## 2017-08-13 NOTE — Progress Notes (Signed)
Mariea Clonts  A and O x 4. VSS. Pt tolerating diet well. No complaints of pain or nausea. IV removed intact by patient, new prescriptions given. Pt confused in regard to discharge instructions. Report called to [place] at [employer]. Pt discharged via EMS to facility.   Lynann Bologna MSN, RN-BC  Allergies as of 08/13/2017   No Known Allergies     Medication List    STOP taking these medications   furosemide 40 MG tablet Commonly known as:  LASIX   meloxicam 7.5 MG tablet Commonly known as:  MOBIC     TAKE these medications   ARIPiprazole 2 MG tablet Commonly known as:  ABILIFY Take 1 tablet by mouth daily.   aspirin 81 MG chewable tablet Chew 1 tablet (81 mg total) by mouth daily.   atorvastatin 40 MG tablet Commonly known as:  LIPITOR Take 40 mg by mouth daily.   buPROPion 150 MG 12 hr tablet Commonly known as:  WELLBUTRIN SR Take 1 tablet by mouth 2 (two) times daily.   busPIRone 10 MG tablet Commonly known as:  BUSPAR Take 1 tablet by mouth 2 (two) times daily.   hydrochlorothiazide 12.5 MG capsule Commonly known as:  MICROZIDE Take 1 capsule by mouth daily.   lamoTRIgine 200 MG tablet Commonly known as:  LAMICTAL Take 1 tablet by mouth daily.   levothyroxine 150 MCG tablet Commonly known as:  SYNTHROID, LEVOTHROID Take 150 mcg by mouth daily.   losartan 50 MG tablet Commonly known as:  COZAAR Take 50 mg by mouth daily.   LYRICA 100 MG capsule Generic drug:  pregabalin Take 100 mg by mouth 2 (two) times daily.   omeprazole 20 MG capsule Commonly known as:  PRILOSEC Take 1 capsule by mouth daily.   oxyCODONE 5 MG immediate release tablet Commonly known as:  Oxy IR/ROXICODONE Take 1 tablet by mouth 3 (three) times daily.   PROAIR HFA 108 (90 Base) MCG/ACT inhaler Generic drug:  albuterol Take by mouth as directed.   venlafaxine XR 150 MG 24 hr capsule Commonly known as:  EFFEXOR-XR Take 150 mg by mouth daily.       Vitals:   08/13/17  1140 08/13/17 1716  BP: (!) 133/51 132/70  Pulse: (!) 42 60  Resp:  20  Temp: (!) 97.3 F (36.3 C) 98.2 F (36.8 C)  SpO2: 96% 100%

## 2017-08-13 NOTE — Discharge Summary (Signed)
[facility] at [place] NAME: Melanie Young    MR#:  [id number]  DATE OF BIRTH:  08/22/1940  DATE OF ADMISSION:  08/10/2017   ADMITTING PHYSICIAN: Demetrios Loll, MD  DATE OF DISCHARGE:  08/13/2017  PRIMARY CARE PHYSICIAN: System, Pcp Not In   ADMISSION DIAGNOSIS:  Dehydration [E86.0] Dizziness [R42] AKI (acute kidney injury) ([place]) [N17.9] DISCHARGE DIAGNOSIS:  Active Problems:   ARF (acute renal failure) ([place])  SECONDARY DIAGNOSIS:   Past Medical History:  Diagnosis Date  . Cancer ([place])   . Depression   . Diabetes mellitus without complication ([place])   . Hypercholesteremia   . Hypertension   . Osteoporosis    HOSPITAL COURSE:  The patient presents the ED with dizziness and generalized weakness.   Acute renal failure due to dehydration. Improved with IV fluids support, Hold Lasix,  HCTZ and meloxicam.  Leukocytosis. Possible due to dehydration.   Improved.  Lactic acidosis. Continue treatment as above.   Improved  Diabetes. on sliding scale. Hypertension. hold Lopressor due to bradycardia. Resume losartan and HCTZ. Obesity.  Generalized weakness.  The patient is very weak, waiting for PT evaluation: SNF.  DISCHARGE CONDITIONS:  Stable, discharge to skilled nursing facility today. CONSULTS OBTAINED:   DRUG ALLERGIES:  No Known Allergies DISCHARGE MEDICATIONS:   Allergies as of 08/13/2017   No Known Allergies     Medication List    STOP taking these medications   furosemide 40 MG tablet Commonly known as:  LASIX   meloxicam 7.5 MG tablet Commonly known as:  MOBIC     TAKE these medications   ARIPiprazole 2 MG tablet Commonly known as:  ABILIFY Take 1 tablet by mouth daily.   aspirin 81 MG chewable tablet Chew 1 tablet (81 mg total) by mouth daily.   atorvastatin 40 MG tablet Commonly known as:  LIPITOR Take 40 mg by mouth daily.   buPROPion 150 MG 12 hr tablet Commonly known as:   WELLBUTRIN SR Take 1 tablet by mouth 2 (two) times daily.   busPIRone 10 MG tablet Commonly known as:  BUSPAR Take 1 tablet by mouth 2 (two) times daily.   hydrochlorothiazide 12.5 MG capsule Commonly known as:  MICROZIDE Take 1 capsule by mouth daily.   lamoTRIgine 200 MG tablet Commonly known as:  LAMICTAL Take 1 tablet by mouth daily.   levothyroxine 150 MCG tablet Commonly known as:  SYNTHROID, LEVOTHROID Take 150 mcg by mouth daily.   losartan 50 MG tablet Commonly known as:  COZAAR Take 50 mg by mouth daily.   LYRICA 100 MG capsule Generic drug:  pregabalin Take 100 mg by mouth 2 (two) times daily.   omeprazole 20 MG capsule Commonly known as:  PRILOSEC Take 1 capsule by mouth daily.   oxyCODONE 5 MG immediate release tablet Commonly known as:  Oxy IR/ROXICODONE Take 1 tablet by mouth 3 (three) times daily.   PROAIR HFA 108 (90 Base) MCG/ACT inhaler Generic drug:  albuterol Take by mouth as directed.   venlafaxine XR 150 MG 24 hr capsule Commonly known as:  EFFEXOR-XR Take 150 mg by mouth daily.        DISCHARGE INSTRUCTIONS:  See AVS. If you experience worsening of your admission symptoms, develop shortness of breath, life threatening emergency, suicidal or homicidal thoughts you must seek medical attention immediately by calling 911 or calling your MD immediately  if symptoms less severe.  You Must read complete instructions/literature along with all the  possible adverse reactions/side effects for all the Medicines you take and that have been prescribed to you. Take any new Medicines after you have completely understood and accpet all the possible adverse reactions/side effects.   Please note  You were cared for by a hospitalist during your hospital stay. If you have any questions about your discharge medications or the care you received while you were in the hospital after you are discharged, you can call the unit and asked to speak with the hospitalist  on call if the hospitalist that took care of you is not available. Once you are discharged, your primary care physician will handle any further medical issues. Please note that NO REFILLS for any discharge medications will be authorized once you are discharged, as it is imperative that you return to your primary care physician (or establish a relationship with a primary care physician if you do not have one) for your aftercare needs so that they can reassess your need for medications and monitor your lab values.    On the day of Discharge:  VITAL SIGNS:  Blood pressure 130/61, pulse (!) 50, temperature 97.8 F (36.6 C), temperature source Oral, resp. rate 20, height 5\' 2"  (1.575 m), weight 248 lb 11.2 oz (112.8 kg), SpO2 94 %. PHYSICAL EXAMINATION:  GENERAL:  76 y.o.-year-old patient lying in the bed with no acute distress.  EYES: Pupils equal, round, reactive to light and accommodation. No scleral icterus. Extraocular muscles intact.  HEENT: Head atraumatic, normocephalic. Oropharynx and nasopharynx clear.  NECK:  Supple, no jugular venous distention. No thyroid enlargement, no tenderness.  LUNGS: Normal breath sounds bilaterally, no wheezing, rales,rhonchi or crepitation. No use of accessory muscles of respiration.  CARDIOVASCULAR: S1, S2 normal. No murmurs, rubs, or gallops.  ABDOMEN: Soft, non-tender, non-distended. Bowel sounds present. No organomegaly or mass.  EXTREMITIES: No pedal edema, cyanosis, or clubbing.  NEUROLOGIC: Cranial nerves II through XII are intact. Muscle strength 3-4/5 in all extremities. Sensation intact. Gait not checked.  PSYCHIATRIC: The patient is alert and oriented x 3.  SKIN: No obvious rash, lesion, or ulcer.  DATA REVIEW:   CBC Recent Labs  Lab 08/11/17 0345  WBC 12.3*  HGB 11.5*  HCT 36.3  PLT 166    Chemistries  Recent Labs  Lab 08/10/17 1507 08/11/17 0345  NA 135 136  K 4.1 4.0  CL 101 105  CO2 28 25  GLUCOSE 157* 209*  BUN 24* 18    CREATININE 1.48* 0.88  CALCIUM 9.0 8.1*  AST 33  --   ALT 25  --   ALKPHOS 113  --   BILITOT 1.1  --      Microbiology Results  Results for orders placed or performed during the hospital encounter of 08/10/17  Urine Culture     Status: None   Collection Time: 08/10/17  5:34 PM  Result Value Ref Range Status   Specimen Description   Final    URINE, CATHETERIZED Performed at [facility], [address] [place], [place] [address]    Special Requests   Final    Normal Performed at [facility], [address] [place], [place] [address]    Culture   Final    NO GROWTH Performed at [facility], [place] [address], [place], [place] [address]    Report Status 08/12/2017 FINAL  Final  Blood Culture (routine x 2)     Status: None (Preliminary result)   Collection Time: 08/10/17  8:15 PM  Result Value Ref Range Status   Specimen Description BLOOD LEFT ARM  Final   Special Requests   Final    BOTTLES DRAWN AEROBIC AND ANAEROBIC Blood Culture results may not be optimal due to an inadequate volume of blood received in culture bottles   Culture   Final    NO GROWTH 2 DAYS Performed at [facility], [address], [place], [place] [address]    Report Status PENDING  Incomplete  Blood Culture (routine x 2)     Status: None (Preliminary result)   Collection Time: 08/10/17  8:15 PM  Result Value Ref Range Status   Specimen Description BLOOD LEFT HAND  Final   Special Requests   Final    BOTTLES DRAWN AEROBIC AND ANAEROBIC Blood Culture results may not be optimal due to an inadequate volume of blood received in culture bottles   Culture   Final    NO GROWTH 2 DAYS Performed at [facility], [address] [place], [place] [address]    Report Status PENDING  Incomplete    RADIOLOGY:  No results found.   Management plans discussed with the patient, family and they are in agreement.  CODE STATUS: Full Code   TOTAL TIME TAKING CARE  OF THIS PATIENT: 32 minutes.    Demetrios Loll M.D on 08/13/2017 at 7:38 AM  Between 7am to 6pm - Pager - [phone]  After 6pm go to [email or web address] - Proofreader  [facility] [place]  Office  [phone]  CC: Primary care physician; System, Pcp Not In   Note: This dictation was prepared with Dragon dictation along with smaller phrase technology. Any transcriptional errors that result from this process are unintentional.

## 2017-08-13 NOTE — Clinical Social Work Note (Addendum)
CSW still waiting for [facility] number, patient can not discharge until [facility] number has been received.  CSW received [place] number for patient which is [phone] E expires 09/12/2017.  Patient to be d/c'ed today to [facility].  Patient and family agreeable to plans will transport via ems RN to call report.  CSW updated patient's son Yuka Lallier [phone].   Jones Broom. [place], MSW, [place]  08/13/2017 1:31 PM

## 2017-08-13 NOTE — Progress Notes (Signed)
MD notified of low HR. Low 40s-low 50s

## 2017-08-15 LAB — CULTURE, BLOOD (ROUTINE X 2)
CULTURE: NO GROWTH
CULTURE: NO GROWTH

## 2018-04-06 ENCOUNTER — Other Ambulatory Visit: Payer: Self-pay | Admitting: Obstetrics and Gynecology

## 2018-04-06 DIAGNOSIS — Z1231 Encounter for screening mammogram for malignant neoplasm of breast: Secondary | ICD-10-CM

## 2018-05-04 ENCOUNTER — Ambulatory Visit
Admission: RE | Admit: 2018-05-04 | Discharge: 2018-05-04 | Disposition: A | Discharge status: Home / Self Care | Payer: Medicare Other | Source: Ambulatory Visit | Attending: Obstetrics and Gynecology | Admitting: Obstetrics and Gynecology

## 2018-05-04 DIAGNOSIS — Z1231 Encounter for screening mammogram for malignant neoplasm of breast: Secondary | ICD-10-CM | POA: Diagnosis present

## 2018-05-04 HISTORY — DX: Malignant neoplasm of unspecified site of unspecified female breast: C50.919

## 2018-08-30 ENCOUNTER — Encounter: Payer: Self-pay | Admitting: Student

## 2018-08-31 ENCOUNTER — Ambulatory Visit
Admission: RE | Admit: 2018-08-31 | Discharge: 2018-08-31 | Disposition: A | Discharge status: Home / Self Care | Payer: Medicare Other | Attending: Internal Medicine | Admitting: Internal Medicine

## 2018-08-31 ENCOUNTER — Encounter: Payer: Self-pay | Admitting: *Deleted

## 2018-08-31 ENCOUNTER — Encounter
Admission: RE | Disposition: A | Discharge status: Home / Self Care | Payer: Self-pay | Source: Home / Self Care | Attending: Internal Medicine

## 2018-08-31 ENCOUNTER — Ambulatory Visit: Payer: Medicare Other | Admitting: Anesthesiology

## 2018-08-31 DIAGNOSIS — E039 Hypothyroidism, unspecified: Secondary | ICD-10-CM | POA: Insufficient documentation

## 2018-08-31 DIAGNOSIS — Z7982 Long term (current) use of aspirin: Secondary | ICD-10-CM | POA: Diagnosis not present

## 2018-08-31 DIAGNOSIS — F329 Major depressive disorder, single episode, unspecified: Secondary | ICD-10-CM | POA: Diagnosis not present

## 2018-08-31 DIAGNOSIS — Z1211 Encounter for screening for malignant neoplasm of colon: Secondary | ICD-10-CM | POA: Diagnosis present

## 2018-08-31 DIAGNOSIS — E119 Type 2 diabetes mellitus without complications: Secondary | ICD-10-CM | POA: Diagnosis not present

## 2018-08-31 DIAGNOSIS — Z79890 Hormone replacement therapy (postmenopausal): Secondary | ICD-10-CM | POA: Diagnosis not present

## 2018-08-31 DIAGNOSIS — Z79899 Other long term (current) drug therapy: Secondary | ICD-10-CM | POA: Insufficient documentation

## 2018-08-31 DIAGNOSIS — Z853 Personal history of malignant neoplasm of breast: Secondary | ICD-10-CM | POA: Diagnosis not present

## 2018-08-31 DIAGNOSIS — M810 Age-related osteoporosis without current pathological fracture: Secondary | ICD-10-CM | POA: Insufficient documentation

## 2018-08-31 DIAGNOSIS — K640 First degree hemorrhoids: Secondary | ICD-10-CM | POA: Insufficient documentation

## 2018-08-31 DIAGNOSIS — Z86010 Personal history of colonic polyps: Secondary | ICD-10-CM | POA: Insufficient documentation

## 2018-08-31 DIAGNOSIS — E7800 Pure hypercholesterolemia, unspecified: Secondary | ICD-10-CM | POA: Insufficient documentation

## 2018-08-31 DIAGNOSIS — K5730 Diverticulosis of large intestine without perforation or abscess without bleeding: Secondary | ICD-10-CM | POA: Diagnosis not present

## 2018-08-31 DIAGNOSIS — I10 Essential (primary) hypertension: Secondary | ICD-10-CM | POA: Insufficient documentation

## 2018-08-31 DIAGNOSIS — Z79891 Long term (current) use of opiate analgesic: Secondary | ICD-10-CM | POA: Insufficient documentation

## 2018-08-31 DIAGNOSIS — F0390 Unspecified dementia without behavioral disturbance: Secondary | ICD-10-CM | POA: Insufficient documentation

## 2018-08-31 DIAGNOSIS — Z7984 Long term (current) use of oral hypoglycemic drugs: Secondary | ICD-10-CM | POA: Insufficient documentation

## 2018-08-31 HISTORY — DX: Chronic kidney disease, unspecified: N18.9

## 2018-08-31 HISTORY — DX: Hypothyroidism, unspecified: E03.9

## 2018-08-31 HISTORY — PX: COLONOSCOPY WITH PROPOFOL: SHX5780

## 2018-08-31 HISTORY — DX: Unspecified dementia, unspecified severity, without behavioral disturbance, psychotic disturbance, mood disturbance, and anxiety: F03.90

## 2018-08-31 LAB — GLUCOSE, CAPILLARY: GLUCOSE-CAPILLARY: 97 mg/dL (ref 70–99)

## 2018-08-31 SURGERY — COLONOSCOPY WITH PROPOFOL
Anesthesia: General

## 2018-08-31 MED ORDER — PROPOFOL 10 MG/ML IV BOLUS
INTRAVENOUS | Status: DC | PRN
  Administered 2018-08-31: 50 mg via INTRAVENOUS

## 2018-08-31 MED ORDER — LIDOCAINE HCL (CARDIAC) PF 100 MG/5ML IV SOSY
PREFILLED_SYRINGE | INTRAVENOUS | Status: DC | PRN
  Administered 2018-08-31: 50 mg via INTRAVENOUS

## 2018-08-31 MED ORDER — SODIUM CHLORIDE 0.9 % IV SOLN
INTRAVENOUS | Status: DC
  Administered 2018-08-31: 09:00:00 via INTRAVENOUS

## 2018-08-31 MED ORDER — PROPOFOL 500 MG/50ML IV EMUL
INTRAVENOUS | Status: AC
  Filled 2018-08-31: qty 50

## 2018-08-31 MED ORDER — PROPOFOL 500 MG/50ML IV EMUL
INTRAVENOUS | Status: DC | PRN
  Administered 2018-08-31: 175 ug/kg/min via INTRAVENOUS

## 2018-08-31 NOTE — Anesthesia Post-op Follow-up Note (Signed)
Anesthesia QCDR form completed.        

## 2018-08-31 NOTE — Interval H&P Note (Signed)
History and Physical Interval Note:  08/31/2018 8:55 AM  Melanie Young  has presented today for surgery, with the diagnosis of PH COLON POLYPS  The various methods of treatment have been discussed with the patient and family. After consideration of risks, benefits and other options for treatment, the patient has consented to  Procedure(s): COLONOSCOPY WITH PROPOFOL (N/A) as a surgical intervention .  The patient's history has been reviewed, patient examined, no change in status, stable for surgery.  I have reviewed the patient's chart and labs.  Questions were answered to the patient's satisfaction.     [place], [place]

## 2018-08-31 NOTE — Anesthesia Procedure Notes (Signed)
Date/Time: 08/31/2018 9:14 AM Performed by: Johnna Acosta, CRNA Pre-anesthesia Checklist: Patient identified, Emergency Drugs available, Suction available, Patient being monitored and Timeout performed Patient Re-evaluated:Patient Re-evaluated prior to induction Oxygen Delivery Method: Nasal cannula Preoxygenation: Pre-oxygenation with 100% oxygen Induction Type: IV induction

## 2018-08-31 NOTE — Transfer of Care (Signed)
Immediate Anesthesia Transfer of Care Note  Patient: Melanie Young  Procedure(s) Performed: COLONOSCOPY WITH PROPOFOL (N/A )  Patient Location: PACU  Anesthesia Type:General  Level of Consciousness: sedated  Airway & Oxygen Therapy: Patient Spontanous Breathing and Patient connected to nasal cannula oxygen  Post-op Assessment: Report given to RN and Post -op Vital signs reviewed and stable  Post vital signs: Reviewed and stable  Last Vitals:  Vitals Value Taken Time  BP 140/75 08/31/2018  9:39 AM  Temp 36.2 C 08/31/2018  9:39 AM  Pulse 61 08/31/2018  9:39 AM  Resp 15 08/31/2018  9:39 AM  SpO2 99 % 08/31/2018  9:39 AM    Last Pain:  Vitals:   08/31/18 0903  TempSrc: Tympanic         Complications: No apparent anesthesia complications

## 2018-08-31 NOTE — Anesthesia Preprocedure Evaluation (Signed)
Anesthesia Evaluation  Patient identified by MRN, date of birth, ID band Patient awake    Reviewed: Allergy & Precautions, H&P , NPO status , Patient's Chart, lab work & pertinent test results, reviewed documented beta blocker date and time   Airway Mallampati: II   Neck ROM: full    Dental  (+) Poor Dentition   Pulmonary neg pulmonary ROS,    Pulmonary exam normal        Cardiovascular Exercise Tolerance: Poor hypertension, On Medications negative cardio ROS Normal cardiovascular exam Rhythm:regular Rate:Normal     Neuro/Psych PSYCHIATRIC DISORDERS Depression Dementia negative neurological ROS     GI/Hepatic negative GI ROS, Neg liver ROS,   Endo/Other  diabetes, Well Controlled, Type 2, Oral Hypoglycemic AgentsHypothyroidism   Renal/GU Renal disease  negative genitourinary   Musculoskeletal   Abdominal   Peds  Hematology negative hematology ROS (+)   Anesthesia Other Findings Past Medical History: 1980's: Breast cancer ([place])     Comment:  right breast ca with mastectomy No date: Dementia ([place]) No date: Depression No date: Diabetes mellitus without complication ([place]) No date: Hypercholesteremia No date: Hypertension No date: Hypothyroidism No date: Osteoporosis Past Surgical History: 2013: BREAST BIOPSY; Left     Comment:  core needle bx, benign No date: BREAST SURGERY No date: CESAREAN SECTION 1973: MASTECTOMY; Right     Comment:  breast ca No date: THYROIDECTOMY   Reproductive/Obstetrics negative OB ROS                             Anesthesia Physical Anesthesia Plan  ASA: III  Anesthesia Plan: General   Post-op Pain Management:    Induction:   PONV Risk Score and Plan:   Airway Management Planned:   Additional Equipment:   Intra-op Plan:   Post-operative Plan:   Informed Consent: I have reviewed the patients History and Physical, chart, labs and discussed  the procedure including the risks, benefits and alternatives for the proposed anesthesia with the patient or authorized representative who has indicated his/her understanding and acceptance.   Dental Advisory Given  Plan Discussed with: CRNA  Anesthesia Plan Comments:         Anesthesia Quick Evaluation

## 2018-08-31 NOTE — OR Nursing (Signed)
Pt dressed with help of her care giver, waiting to speak with Dr. Alice Reichert prior to dc.

## 2018-08-31 NOTE — Anesthesia Postprocedure Evaluation (Signed)
Anesthesia Post Note  Patient: Melanie Young  Procedure(s) Performed: COLONOSCOPY WITH PROPOFOL (N/A )  Patient location during evaluation: PACU Anesthesia Type: General Level of consciousness: awake and alert Pain management: pain level controlled Vital Signs Assessment: post-procedure vital signs reviewed and stable Respiratory status: spontaneous breathing, nonlabored ventilation, respiratory function stable and patient connected to nasal cannula oxygen Cardiovascular status: blood pressure returned to baseline and stable Postop Assessment: no apparent nausea or vomiting Anesthetic complications: no     Last Vitals:  Vitals:   08/31/18 0949 08/31/18 0959  BP: (!) 151/85 (!) 163/95  Pulse: 70 77  Resp: 19 15  Temp:    SpO2: 99% 99%    Last Pain:  Vitals:   08/31/18 0959  TempSrc:   PainSc: 0-No pain                 Molli Barrows

## 2018-08-31 NOTE — H&P (Signed)
Outpatient short stay form Pre-procedure 08/31/2018 8:54 AM Sahara Fujimoto K. Alice Reichert, M.D.  Primary Physician: Patrick North, M.D.  Reason for visit: Personal hx of colon polyps.  History of present illness:                            Patient presents for colonoscopy for a personal hx of colon polyps. The patient denies abdominal pain, abnormal weight loss or rectal bleeding.    No current facility-administered medications for this encounter.   Medications Prior to Admission  Medication Sig Dispense Refill Last Dose  . aspirin 325 MG EC tablet Take 325 mg by mouth daily.     Marland Kitchen linagliptin (TRADJENTA) 5 MG TABS tablet Take 5 mg by mouth daily.     . metFORMIN (GLUCOPHAGE) 500 MG tablet Take 500 mg by mouth 2 (two) times daily with a meal.     . oxybutynin (DITROPAN) 5 MG tablet Take 5 mg by mouth 3 (three) times daily.     . QUEtiapine (SEROQUEL) 25 MG tablet Take 25 mg by mouth at bedtime.     . torsemide (DEMADEX) 20 MG tablet Take 20 mg by mouth daily.     Marland Kitchen trolamine salicylate (ASPERCREME) 10 % cream Apply 1 application topically as needed for muscle pain.     . ARIPiprazole (ABILIFY) 2 MG tablet Take 1 tablet by mouth daily.   08/10/2017 at PM  . aspirin 81 MG chewable tablet Chew 1 tablet (81 mg total) by mouth daily. 30 tablet 1   . atorvastatin (LIPITOR) 40 MG tablet Take 40 mg by mouth daily.   08/09/2017 at PM  . buPROPion (WELLBUTRIN SR) 150 MG 12 hr tablet Take 1 tablet by mouth 2 (two) times daily.   08/10/2017 at AM  . busPIRone (BUSPAR) 10 MG tablet Take 1 tablet by mouth 2 (two) times daily.   08/10/2017 at AM  . hydrochlorothiazide (MICROZIDE) 12.5 MG capsule Take 1 capsule by mouth daily.   08/10/2017 at AM  . lamoTRIgine (LAMICTAL) 200 MG tablet Take 1 tablet by mouth daily.   08/10/2017 at AM  . levothyroxine (SYNTHROID, LEVOTHROID) 150 MCG tablet Take 150 mcg by mouth daily.   08/10/2017 at AM  . losartan (COZAAR) 50 MG tablet Take 50 mg by mouth daily.   08/10/2017 at  AM  . LYRICA 100 MG capsule Take 100 mg by mouth 2 (two) times daily.   08/10/2017 at AM  . omeprazole (PRILOSEC) 20 MG capsule Take 1 capsule by mouth daily.   08/10/2017 at AM  . oxyCODONE (OXY IR/ROXICODONE) 5 MG immediate release tablet Take 1 tablet by mouth 3 (three) times daily.   08/10/2017 at AM  . PROAIR HFA 108 (90 Base) MCG/ACT inhaler Take by mouth as directed.   PRN at PRN  . venlafaxine XR (EFFEXOR-XR) 150 MG 24 hr capsule Take 150 mg by mouth daily.   08/10/2017 at AM     No Known Allergies   Past Medical History:  Diagnosis Date  . Breast cancer ([place]) 1980's   right breast ca with mastectomy  . Dementia ([place])   . Depression   . Diabetes mellitus without complication ([place])   . Hypercholesteremia   . Hypertension   . Hypothyroidism   . Osteoporosis     Review of systems:  Otherwise negative.    Physical Exam  Gen: Alert, oriented. Appears stated age.  HEENT: [place]/AT. PERRLA. Lungs: CTA, no wheezes. CV: RR  nl S1, S2. Abd: soft, benign, no masses. BS+ Ext: No edema. Pulses 2+    Planned procedures: Proceed with colonoscopy. The patient understands the nature of the planned procedure, indications, risks, alternatives and potential complications including but not limited to bleeding, infection, perforation, damage to internal organs and possible oversedation/side effects from anesthesia. The patient agrees and gives consent to proceed.  Please refer to procedure notes for findings, recommendations and patient disposition/instructions.     Oluwademilade Kellett K. Alice Reichert, M.D. Gastroenterology 08/31/2018  8:54 AM

## 2018-08-31 NOTE — Op Note (Signed)
[facility] Gastroenterology Patient Name: Melanie Young Procedure Date: 08/31/2018 9:14 AM MRN: [id number] Account #: [id number] Date of Birth: 1941/07/11 Admit Type: Outpatient Age: 78 Room: [facility] ENDO ROOM 2 Gender: Female Note Status: Finalized Procedure:            Colonoscopy Indications:          Surveillance: Personal history of adenomatous polyps on                        last colonoscopy > 3 years ago Providers:            Lorie Apley K. Alice Reichert MD, MD Referring MD:         Rosalyn Charters. Haque (Referring MD) Medicines:            Propofol per Anesthesia Complications:        No immediate complications. Procedure:            Pre-Anesthesia Assessment:                       - The risks and benefits of the procedure and the                        sedation options and risks were discussed with the                        patient. All questions were answered and informed                        consent was obtained.                       - Patient identification and proposed procedure were                        verified prior to the procedure by the nurse. The                        procedure was verified in the procedure room.                       - ASA Grade Assessment: III - A patient with severe                        systemic disease.                       - After reviewing the risks and benefits, the patient                        was deemed in satisfactory condition to undergo the                        procedure.                       After obtaining informed consent, the colonoscope was                        passed under direct vision. Throughout the procedure,  the patient's blood pressure, pulse, and oxygen                        saturations were monitored continuously. The                        Colonoscope was introduced through the anus and                        advanced to the the cecum, identified by appendiceal         orifice and ileocecal valve. The colonoscopy was                        performed without difficulty. The patient tolerated the                        procedure well. The quality of the bowel preparation                        was good. The ileocecal valve, appendiceal orifice, and                        rectum were photographed. Findings:      The perianal and digital rectal examinations were normal. Pertinent       negatives include normal sphincter tone and no palpable rectal lesions.      Many small-mouthed diverticula were found in the sigmoid colon.      Non-bleeding internal hemorrhoids were found during retroflexion. The       hemorrhoids were Grade I (internal hemorrhoids that do not prolapse).      The exam was otherwise without abnormality. Impression:           - Diverticulosis in the sigmoid colon.                       - Non-bleeding internal hemorrhoids.                       - The examination was otherwise normal.                       - No specimens collected. Recommendation:       - Patient has a contact number available for                        emergencies. The signs and symptoms of potential                        delayed complications were discussed with the patient.                        Return to normal activities tomorrow. Written discharge                        instructions were provided to the patient.                       - Resume previous diet.                       - Continue present medications.                       -  No repeat colonoscopy due to current age (43 years or                        older) and the absence of colonic polyps.                       - Return to GI office PRN.                       - The findings and recommendations were discussed with                        the patient.                       - The findings and recommendations were discussed with                        the designated responsible adult. Procedure Code(s):     --- Professional ---                       Z6109, Colorectal cancer screening; colonoscopy on                        individual at high risk Diagnosis Code(s):    --- Professional ---                       K57.30, Diverticulosis of large intestine without                        perforation or abscess without bleeding                       K64.0, First degree hemorrhoids                       Z86.010, Personal history of colonic polyps CPT copyright 2018 American Medical Association. All rights reserved. The codes documented in this report are preliminary and upon coder review may  be revised to meet current compliance requirements. Efrain Sella MD, MD 08/31/2018 9:34:42 AM This report has been signed electronically. Number of Addenda: 0 Note Initiated On: 08/31/2018 9:14 AM Scope Withdrawal Time: 0 hours 5 minutes 47 seconds  Total Procedure Duration: 0 hours 10 minutes 23 seconds       [facility]

## 2018-09-01 ENCOUNTER — Encounter: Payer: Self-pay | Admitting: Internal Medicine

## 2019-06-02 ENCOUNTER — Other Ambulatory Visit: Payer: Self-pay

## 2019-06-02 ENCOUNTER — Emergency Department
Admission: EM | Admit: 2019-06-02 | Discharge: 2019-06-02 | Disposition: A | Discharge status: Home / Self Care | Payer: Medicare Other | Attending: Emergency Medicine | Admitting: Emergency Medicine

## 2019-06-02 ENCOUNTER — Emergency Department: Payer: Medicare Other

## 2019-06-02 DIAGNOSIS — E039 Hypothyroidism, unspecified: Secondary | ICD-10-CM | POA: Diagnosis not present

## 2019-06-02 DIAGNOSIS — F0390 Unspecified dementia without behavioral disturbance: Secondary | ICD-10-CM | POA: Insufficient documentation

## 2019-06-02 DIAGNOSIS — E1122 Type 2 diabetes mellitus with diabetic chronic kidney disease: Secondary | ICD-10-CM | POA: Diagnosis not present

## 2019-06-02 DIAGNOSIS — N189 Chronic kidney disease, unspecified: Secondary | ICD-10-CM | POA: Insufficient documentation

## 2019-06-02 DIAGNOSIS — R0789 Other chest pain: Secondary | ICD-10-CM | POA: Insufficient documentation

## 2019-06-02 DIAGNOSIS — Z853 Personal history of malignant neoplasm of breast: Secondary | ICD-10-CM | POA: Insufficient documentation

## 2019-06-02 DIAGNOSIS — I129 Hypertensive chronic kidney disease with stage 1 through stage 4 chronic kidney disease, or unspecified chronic kidney disease: Secondary | ICD-10-CM | POA: Diagnosis not present

## 2019-06-02 DIAGNOSIS — R079 Chest pain, unspecified: Secondary | ICD-10-CM

## 2019-06-02 LAB — CBC WITH DIFFERENTIAL/PLATELET
Abs Immature Granulocytes: 0.01 10*3/uL (ref 0.00–0.07)
Basophils Absolute: 0 10*3/uL (ref 0.0–0.1)
Basophils Relative: 0 %
Eosinophils Absolute: 0.2 10*3/uL (ref 0.0–0.5)
Eosinophils Relative: 4 %
HCT: 38.2 % (ref 36.0–46.0)
Hemoglobin: 12.1 g/dL (ref 12.0–15.0)
Immature Granulocytes: 0 %
Lymphocytes Relative: 41 %
Lymphs Abs: 2.6 10*3/uL (ref 0.7–4.0)
MCH: 26.7 pg (ref 26.0–34.0)
MCHC: 31.7 g/dL (ref 30.0–36.0)
MCV: 84.3 fL (ref 80.0–100.0)
Monocytes Absolute: 0.8 10*3/uL (ref 0.1–1.0)
Monocytes Relative: 13 %
Neutro Abs: 2.6 10*3/uL (ref 1.7–7.7)
Neutrophils Relative %: 42 %
Platelets: 223 10*3/uL (ref 150–400)
RBC: 4.53 MIL/uL (ref 3.87–5.11)
RDW: 14.5 % (ref 11.5–15.5)
WBC: 6.2 10*3/uL (ref 4.0–10.5)
nRBC: 0 % (ref 0.0–0.2)

## 2019-06-02 LAB — COMPREHENSIVE METABOLIC PANEL
ALT: 17 U/L (ref 0–44)
AST: 21 U/L (ref 15–41)
Albumin: 3.8 g/dL (ref 3.5–5.0)
Alkaline Phosphatase: 102 U/L (ref 38–126)
Anion gap: 14 (ref 5–15)
BUN: 16 mg/dL (ref 8–23)
CO2: 26 mmol/L (ref 22–32)
Calcium: 9.1 mg/dL (ref 8.9–10.3)
Chloride: 102 mmol/L (ref 98–111)
Creatinine, Ser: 1.06 mg/dL — ABNORMAL HIGH (ref 0.44–1.00)
GFR calc Af Amer: 59 mL/min — ABNORMAL LOW (ref >60)
GFR calc non Af Amer: 51 mL/min — ABNORMAL LOW (ref >60)
Glucose, Bld: 101 mg/dL — ABNORMAL HIGH (ref 70–99)
Potassium: 3.8 mmol/L (ref 3.5–5.1)
Sodium: 142 mmol/L (ref 135–145)
Total Bilirubin: 0.8 mg/dL (ref 0.3–1.2)
Total Protein: 7.1 g/dL (ref 6.5–8.1)

## 2019-06-02 LAB — TROPONIN I (HIGH SENSITIVITY)
Troponin I (High Sensitivity): 15 ng/L (ref <18)
Troponin I (High Sensitivity): 15 ng/L (ref <18)

## 2019-06-02 MED ORDER — KETOROLAC TROMETHAMINE 30 MG/ML IJ SOLN
15.0000 mg | Freq: Once | INTRAMUSCULAR | Status: AC
  Administered 2019-06-02: 15 mg via INTRAVENOUS
  Filled 2019-06-02: qty 1

## 2019-06-02 MED ORDER — TORSEMIDE 20 MG PO TABS
20.0000 mg | ORAL_TABLET | Freq: Every day | ORAL | Status: DC
  Administered 2019-06-02: 20 mg via ORAL
  Filled 2019-06-02 (×3): qty 1

## 2019-06-02 MED ORDER — LORAZEPAM 2 MG/ML IJ SOLN
0.5000 mg | Freq: Once | INTRAMUSCULAR | Status: AC
  Administered 2019-06-02: 0.5 mg via INTRAVENOUS
  Filled 2019-06-02: qty 1

## 2019-06-02 MED ORDER — MECLIZINE HCL 25 MG PO TABS
50.0000 mg | ORAL_TABLET | Freq: Once | ORAL | Status: AC
  Administered 2019-06-02: 50 mg via ORAL
  Filled 2019-06-02: qty 2

## 2019-06-02 NOTE — ED Notes (Signed)
Pt verbalized discharge instructions and has no questions at this time 

## 2019-06-02 NOTE — ED Notes (Signed)
Called Pharmacy and spoke with Abby regarding pt's medication. Abby states she will send it up.

## 2019-06-02 NOTE — ED Triage Notes (Addendum)
Pt arrived via EMS for report of HTN and chest pain - pain radiates to right arm  EMS gave 2 sprays of NTG and ASA 324mg 

## 2019-06-02 NOTE — ED Notes (Signed)
Pt POA (son) Shamyiah Kaeo updated on pt condition and POC [phone]

## 2019-06-02 NOTE — ED Notes (Signed)
Messaged pharmacy to send demedex

## 2019-06-02 NOTE — ED Notes (Signed)
Xray took prior to VS obtained

## 2019-06-02 NOTE — ED Provider Notes (Signed)
[facility] Emergency Department Provider Note       Time seen: ----------------------------------------- 10:16 AM on 06/02/2019 -----------------------------------------   I have reviewed the triage vital signs and the nursing notes.  HISTORY   Chief Complaint Chest Pain    HPI Melanie Young is a 78 y.o. female with a history of breast cancer, dementia, depression, diabetes, hyperlipidemia, hypertension who presents to the ED for hypertension and chest pain.  Pain radiates into the right arm.  EMS gave 2 sprays of nitroglycerin and aspirin prior to arrival.  Patient describes 8 out of 10 chest pain that is in the mid chest as well as in the right arm.  Past Medical History:  Diagnosis Date  . Breast cancer ([place]) 1980's   right breast ca with mastectomy  . Chronic kidney disease   . Dementia ([place])   . Depression   . Diabetes mellitus without complication ([place])   . Hypercholesteremia   . Hypertension   . Hypothyroidism   . Osteoporosis     Patient Active Problem List   Diagnosis Date Noted  . ARF (acute renal failure) ([place]) 08/10/2017    Past Surgical History:  Procedure Laterality Date  . BREAST BIOPSY Left 2013   core needle bx, benign  . BREAST SURGERY    . CESAREAN SECTION    . COLONOSCOPY WITH PROPOFOL N/A 08/31/2018   Procedure: COLONOSCOPY WITH PROPOFOL;  Surgeon: Toledo, Benay Pike, MD;  Location: [facility] ENDOSCOPY;  Service: Endoscopy;  Laterality: N/A;  . MASTECTOMY Right 1973   breast ca  . THYROIDECTOMY      Allergies Patient has no known allergies.  Social History Social History   Tobacco Use  . Smoking status: Never Smoker  . Smokeless tobacco: Never Used  Substance Use Topics  . Alcohol use: Not Currently  . Drug use: Never   Review of Systems Constitutional: Negative for fever. Cardiovascular: Positive for chest pain Respiratory: Negative for shortness of breath. Gastrointestinal: Negative for abdominal pain,  vomiting and diarrhea. Musculoskeletal: Positive for right arm pain Skin: Negative for rash. Neurological: Negative for headaches, focal weakness or numbness.  All systems negative/normal/unremarkable except as stated in the HPI  ____________________________________________   PHYSICAL EXAM:  VITAL SIGNS: ED Triage Vitals  Enc Vitals Group     BP 06/02/19 1012 (!) 154/94     Pulse Rate 06/02/19 1012 86     Resp 06/02/19 1012 20     Temp 06/02/19 1012 97.8 F (36.6 C)     Temp Source 06/02/19 1012 Oral     SpO2 06/02/19 1012 97 %     Weight 06/02/19 0956 240 lb (108.9 kg)     Height 06/02/19 0956 5\' 5"  (1.651 m)     Head Circumference --      Peak Flow --      Pain Score 06/02/19 0956 8     Pain Loc --      Pain Edu? --      Excl. in [place]? --    Constitutional: Alert, no acute distress Eyes: Conjunctivae are normal. Normal extraocular movements. ENT      Head: Normocephalic and atraumatic.      Nose: No congestion/rhinnorhea.      Mouth/Throat: Mucous membranes are moist.      Neck: No stridor. Cardiovascular: Normal rate, regular rhythm. No murmurs, rubs, or gallops. Respiratory: Normal respiratory effort without tachypnea nor retractions. Breath sounds are clear and equal bilaterally. No wheezes/rales/rhonchi. Gastrointestinal: Soft and nontender. Normal bowel  sounds Musculoskeletal: Nontender with normal range of motion in extremities. No lower extremity tenderness nor edema. Neurologic:  Normal speech and language. No gross focal neurologic deficits are appreciated.  Skin:  Skin is warm, dry and intact. No rash noted. Psychiatric: Flat affect ____________________________________________  EKG: Interpreted by me.  Sinus rhythm the rate of 76 bpm, LVH, possible old inferior infarct, normal QT  ____________________________________________  ED COURSE:  As part of my medical decision making, I reviewed the following data within the [facility] History  obtained from family if available, nursing notes, old chart and ekg, as well as notes from prior ED visits. Patient presented for chest pain, we will assess with labs and imaging as indicated at this time.   Procedures  Melanie Young was evaluated in Emergency Department on 06/02/2019 for the symptoms described in the history of present illness. She was evaluated in the context of the global COVID-19 pandemic, which necessitated consideration that the patient might be at risk for infection with the SARS-CoV-2 virus that causes COVID-19. Institutional protocols and algorithms that pertain to the evaluation of patients at risk for COVID-19 are in a state of rapid change based on information released by regulatory bodies including the CDC and federal and state organizations. These policies and algorithms were followed during the patient's care in the ED.  ____________________________________________   LABS (pertinent positives/negatives)  Labs Reviewed  COMPREHENSIVE METABOLIC PANEL - Abnormal; Notable for the following components:      Result Value   Glucose, Bld 101 (*)    Creatinine, Ser 1.06 (*)    GFR calc non Af Amer 51 (*)    GFR calc Af Amer 59 (*)    All other components within normal limits  CBC WITH DIFFERENTIAL/PLATELET  TROPONIN I (HIGH SENSITIVITY)  TROPONIN I (HIGH SENSITIVITY)    RADIOLOGY Images were viewed by me  Chest x-ray IMPRESSION:  Cardiomegaly with vascular congestion and interstitial edema.  ____________________________________________   DIFFERENTIAL DIAGNOSIS   Musculoskeletal pain, GERD, anxiety, PE, MI, depression  FINAL ASSESSMENT AND PLAN  Chest pain   Plan: The patient had presented for chest pain. Patient's labs do not reveal any acute process, repeat troponin was negative. Patient's imaging revealed cardiomegaly with vascular congestion and interstitial edema.  She has no complaints of shortness of breath.  I did give her an extra dose of  torsemide but otherwise she appears cleared for outpatient follow-up.   Laurence Aly, MD    Note: This note was generated in part or whole with voice recognition software. Voice recognition is usually quite accurate but there are transcription errors that can and very often do occur. I apologize for any typographical errors that were not detected and corrected.     Earleen Newport, MD 06/02/19 [phone]

## 2019-07-13 ENCOUNTER — Emergency Department: Payer: Medicare Other

## 2019-07-13 ENCOUNTER — Encounter: Payer: Self-pay | Admitting: Emergency Medicine

## 2019-07-13 ENCOUNTER — Other Ambulatory Visit: Payer: Self-pay

## 2019-07-13 ENCOUNTER — Emergency Department
Admission: EM | Admit: 2019-07-13 | Discharge: 2019-07-13 | Disposition: A | Discharge status: Skilled Nursing Facility | Payer: Medicare Other | Attending: Emergency Medicine | Admitting: Emergency Medicine

## 2019-07-13 DIAGNOSIS — Z79899 Other long term (current) drug therapy: Secondary | ICD-10-CM | POA: Diagnosis not present

## 2019-07-13 DIAGNOSIS — E039 Hypothyroidism, unspecified: Secondary | ICD-10-CM | POA: Diagnosis not present

## 2019-07-13 DIAGNOSIS — E1122 Type 2 diabetes mellitus with diabetic chronic kidney disease: Secondary | ICD-10-CM | POA: Insufficient documentation

## 2019-07-13 DIAGNOSIS — R410 Disorientation, unspecified: Secondary | ICD-10-CM

## 2019-07-13 DIAGNOSIS — Z853 Personal history of malignant neoplasm of breast: Secondary | ICD-10-CM | POA: Diagnosis not present

## 2019-07-13 DIAGNOSIS — N189 Chronic kidney disease, unspecified: Secondary | ICD-10-CM | POA: Insufficient documentation

## 2019-07-13 DIAGNOSIS — R404 Transient alteration of awareness: Secondary | ICD-10-CM

## 2019-07-13 DIAGNOSIS — R079 Chest pain, unspecified: Secondary | ICD-10-CM | POA: Diagnosis present

## 2019-07-13 DIAGNOSIS — F0390 Unspecified dementia without behavioral disturbance: Secondary | ICD-10-CM | POA: Diagnosis not present

## 2019-07-13 DIAGNOSIS — R1032 Left lower quadrant pain: Secondary | ICD-10-CM | POA: Insufficient documentation

## 2019-07-13 DIAGNOSIS — Z7982 Long term (current) use of aspirin: Secondary | ICD-10-CM | POA: Diagnosis not present

## 2019-07-13 DIAGNOSIS — I129 Hypertensive chronic kidney disease with stage 1 through stage 4 chronic kidney disease, or unspecified chronic kidney disease: Secondary | ICD-10-CM | POA: Diagnosis not present

## 2019-07-13 DIAGNOSIS — Z7984 Long term (current) use of oral hypoglycemic drugs: Secondary | ICD-10-CM | POA: Insufficient documentation

## 2019-07-13 LAB — SALICYLATE LEVEL: Salicylate Lvl: <7 mg/dL (ref 2.8–30.0)

## 2019-07-13 LAB — BASIC METABOLIC PANEL
Anion gap: 10 (ref 5–15)
BUN: 15 mg/dL (ref 8–23)
CO2: 26 mmol/L (ref 22–32)
Calcium: 9.1 mg/dL (ref 8.9–10.3)
Chloride: 105 mmol/L (ref 98–111)
Creatinine, Ser: 1.06 mg/dL — ABNORMAL HIGH (ref 0.44–1.00)
GFR calc Af Amer: 59 mL/min — ABNORMAL LOW (ref >60)
GFR calc non Af Amer: 51 mL/min — ABNORMAL LOW (ref >60)
Glucose, Bld: 102 mg/dL — ABNORMAL HIGH (ref 70–99)
Potassium: 3.9 mmol/L (ref 3.5–5.1)
Sodium: 141 mmol/L (ref 135–145)

## 2019-07-13 LAB — LACTIC ACID, PLASMA: Lactic Acid, Venous: 1 mmol/L (ref 0.5–1.9)

## 2019-07-13 LAB — URINALYSIS, COMPLETE (UACMP) WITH MICROSCOPIC
Bacteria, UA: NONE SEEN
Bilirubin Urine: NEGATIVE
Glucose, UA: NEGATIVE mg/dL
Hgb urine dipstick: NEGATIVE
Ketones, ur: NEGATIVE mg/dL
Leukocytes,Ua: NEGATIVE
Nitrite: NEGATIVE
Protein, ur: 30 mg/dL — AB
Specific Gravity, Urine: 1.027 (ref 1.005–1.030)
pH: 5 (ref 5.0–8.0)

## 2019-07-13 LAB — DIFFERENTIAL
Abs Immature Granulocytes: 0.01 10*3/uL (ref 0.00–0.07)
Basophils Absolute: 0 10*3/uL (ref 0.0–0.1)
Basophils Relative: 0 %
Eosinophils Absolute: 0.2 10*3/uL (ref 0.0–0.5)
Eosinophils Relative: 3 %
Immature Granulocytes: 0 %
Lymphocytes Relative: 45 %
Lymphs Abs: 2.8 10*3/uL (ref 0.7–4.0)
Monocytes Absolute: 0.8 10*3/uL (ref 0.1–1.0)
Monocytes Relative: 13 %
Neutro Abs: 2.5 10*3/uL (ref 1.7–7.7)
Neutrophils Relative %: 39 %

## 2019-07-13 LAB — HEPATIC FUNCTION PANEL
ALT: 18 U/L (ref 0–44)
AST: 23 U/L (ref 15–41)
Albumin: 3.8 g/dL (ref 3.5–5.0)
Alkaline Phosphatase: 120 U/L (ref 38–126)
Bilirubin, Direct: 0.2 mg/dL (ref 0.0–0.2)
Indirect Bilirubin: 0.9 mg/dL (ref 0.3–0.9)
Total Bilirubin: 1.1 mg/dL (ref 0.3–1.2)
Total Protein: 7.8 g/dL (ref 6.5–8.1)

## 2019-07-13 LAB — CBC
HCT: 37.9 % (ref 36.0–46.0)
HCT: 38.5 % (ref 36.0–46.0)
Hemoglobin: 12.2 g/dL (ref 12.0–15.0)
Hemoglobin: 12.2 g/dL (ref 12.0–15.0)
MCH: 27.3 pg (ref 26.0–34.0)
MCH: 26.9 pg (ref 26.0–34.0)
MCHC: 32.2 g/dL (ref 30.0–36.0)
MCHC: 31.7 g/dL (ref 30.0–36.0)
MCV: 84.8 fL (ref 80.0–100.0)
MCV: 84.8 fL (ref 80.0–100.0)
Platelets: 220 10*3/uL (ref 150–400)
Platelets: 233 10*3/uL (ref 150–400)
RBC: 4.47 MIL/uL (ref 3.87–5.11)
RBC: 4.54 MIL/uL (ref 3.87–5.11)
RDW: 14.9 % (ref 11.5–15.5)
RDW: 15 % (ref 11.5–15.5)
WBC: 6.4 10*3/uL (ref 4.0–10.5)
WBC: 6.3 10*3/uL (ref 4.0–10.5)
nRBC: 0 % (ref 0.0–0.2)
nRBC: 0 % (ref 0.0–0.2)

## 2019-07-13 LAB — TROPONIN I (HIGH SENSITIVITY)
Troponin I (High Sensitivity): 15 ng/L (ref <18)
Troponin I (High Sensitivity): 13 ng/L (ref <18)

## 2019-07-13 LAB — URINE DRUG SCREEN, QUALITATIVE (ARMC ONLY)
Amphetamines, Ur Screen: NOT DETECTED
Barbiturates, Ur Screen: NOT DETECTED
Benzodiazepine, Ur Scrn: NOT DETECTED
Cannabinoid 50 Ng, Ur ~~LOC~~: NOT DETECTED
Cocaine Metabolite,Ur ~~LOC~~: NOT DETECTED
MDMA (Ecstasy)Ur Screen: NOT DETECTED
Methadone Scn, Ur: NOT DETECTED
Opiate, Ur Screen: NOT DETECTED
Phencyclidine (PCP) Ur S: NOT DETECTED
Tricyclic, Ur Screen: NOT DETECTED

## 2019-07-13 LAB — ETHANOL: Alcohol, Ethyl (B): <10 mg/dL (ref <10)

## 2019-07-13 LAB — ACETAMINOPHEN LEVEL: Acetaminophen (Tylenol), Serum: <10 ug/mL — ABNORMAL LOW (ref 10–30)

## 2019-07-13 LAB — GLUCOSE, CAPILLARY: Glucose-Capillary: 84 mg/dL (ref 70–99)

## 2019-07-13 LAB — BRAIN NATRIURETIC PEPTIDE: B Natriuretic Peptide: 60 pg/mL (ref 0.0–100.0)

## 2019-07-13 MED ORDER — IOHEXOL 300 MG/ML  SOLN
100.0000 mL | Freq: Once | INTRAMUSCULAR | Status: AC | PRN
  Administered 2019-07-13: 100 mL via INTRAVENOUS

## 2019-07-13 MED ORDER — NITROGLYCERIN 2 % TD OINT
0.5000 [in_us] | TOPICAL_OINTMENT | Freq: Once | TRANSDERMAL | Status: AC
  Administered 2019-07-13: 0.5 [in_us] via TOPICAL
  Filled 2019-07-13: qty 1

## 2019-07-13 MED ORDER — LOSARTAN POTASSIUM 50 MG PO TABS
50.0000 mg | ORAL_TABLET | Freq: Once | ORAL | Status: AC
  Administered 2019-07-13: 50 mg via ORAL
  Filled 2019-07-13: qty 1

## 2019-07-13 MED ORDER — IOHEXOL 9 MG/ML PO SOLN
500.0000 mL | ORAL | Status: AC
  Administered 2019-07-13: 500 mL via ORAL

## 2019-07-13 MED ORDER — SODIUM CHLORIDE 0.9% FLUSH: 3.0000 mL | Freq: Once | INTRAVENOUS | Status: DC

## 2019-07-13 NOTE — ED Notes (Signed)
Patient transported to CT 

## 2019-07-13 NOTE — ED Notes (Signed)
[facility] called for follow up, #4 on list

## 2019-07-13 NOTE — Progress Notes (Signed)
eeg completed ° °

## 2019-07-13 NOTE — ED Notes (Signed)
Report called to [place] at [facility].  No transportation available, EMS called for transport.

## 2019-07-13 NOTE — Procedures (Signed)
Patient Name: Melanie Young  MRN: [id number]  Epilepsy Attending: Lora Havens  Referring Physician/Provider: Dr Leotis Pain Date: 07/13/2019 Duration: 28.27 mins  Patient history: 78yo F ith staring spell. EEG to evaluate for seizure  Level of alertness: awake  AEDs during EEG study: None  Technical aspects: This EEG study was done with scalp electrodes positioned according to the 10-20 International system of electrode placement. Electrical activity was acquired at a sampling rate of 500Hz  and reviewed with a high frequency filter of 70Hz  and a low frequency filter of 1Hz . EEG data were recorded continuously and digitally stored.   DESCRIPTION: The posterior dominant rhythm consists of 9-10 Hz activity of moderate voltage (25-35 uV) seen predominantly in posterior head regions, symmetric and reactive to eye opening and eye closing. Physiologic photic driving was seen during photic stimulation.  Hyperventilation was not performed.  IMPRESSION: This study is within normal limits. No seizures or epileptiform discharges were seen throughout the recording.  Trent Theisen Barbra Sarks

## 2019-07-13 NOTE — ED Notes (Signed)
Patient awake and alert.  Having episodes of staring off and not responding.  Episodes are brief and patient returns to baseline mental status.  Speech clear.  MAE.  Continue to monitor.

## 2019-07-13 NOTE — Discharge Instructions (Addendum)
The CAT scan of your belly and your heart blood work and your other blood work all looked okay.  Additionally when you had the staring spells we did the EEG and that was also reported as normal.  I will let you go.  I do want you to follow-up with a cardiologist though.  Please give Dr. [name] a call should be out of see you Monday or Tuesday in the office.  Please return here if you get worse at all.  This includes worst pain, fever,  nausea, vomiting, shortness of breath or any other complaints.

## 2019-07-13 NOTE — Consult Note (Signed)
Reason for Consult: blank stare Referring Physician: Dr. Cinda Quest   CC: rule out seizures   HPI: Melanie Young is an 78 y.o. female with a history of breast cancer, dementia, depression, diabetes, hyperlipidemia, hypertension who presents to the ED for hypertension, L sided abdominal pain that radiates to her chest pain.  Similar presentation last month. Work up has been negative.  Pt resides in [place].  Neurology asked to evaluate for brief episodes of blank stare   Past Medical History:  Diagnosis Date  . Breast cancer ([place]) 1980's   right breast ca with mastectomy  . Chronic kidney disease   . Dementia ([place])   . Depression   . Diabetes mellitus without complication ([place])   . Hypercholesteremia   . Hypertension   . Hypothyroidism   . Osteoporosis     Past Surgical History:  Procedure Laterality Date  . BREAST BIOPSY Left 2013   core needle bx, benign  . BREAST SURGERY    . CESAREAN SECTION    . COLONOSCOPY WITH PROPOFOL N/A 08/31/2018   Procedure: COLONOSCOPY WITH PROPOFOL;  Surgeon: Toledo, Benay Pike, MD;  Location: [facility] ENDOSCOPY;  Service: Endoscopy;  Laterality: N/A;  . MASTECTOMY Right 1973   breast ca  . THYROIDECTOMY      Family History  Problem Relation Age of Onset  . Breast cancer Maternal Aunt   . Breast cancer Cousin        3 maternal cousins, 55's    Social History:  reports that she has never smoked. She has never used smokeless tobacco. She reports previous alcohol use. She reports that she does not use drugs.  No Known Allergies  Medications: I have reviewed the patient's current medications.  ROS: Unable to obtain due to baseline dementia and confusion   Physical Examination: Blood pressure (!) 197/87, pulse (!) 56, temperature 98.4 F (36.9 C), temperature source Oral, resp. rate 16, weight 108.9 kg, SpO2 98 %.   Neurological Examination   Mental Status: Alert to Name  Speech fluent but slow  Cranial Nerves: II: Discs flat  bilaterally; Visual fields grossly normal, pupils equal, round, reactive to light and accommodation III,IV, VI: ptosis not present, extra-ocular motions intact bilaterally V,VII: smile symmetric, facial light touch sensation normal bilaterally VIII: hearing normal bilaterally IX,X: gag reflex present XI: bilateral shoulder shrug XII: midline tongue extension Motor: Generalized weakness bilaterally  Tone and bulk:normal tone throughout; no atrophy noted Sensory: Pinprick and light touch intact throughout, bilaterally Deep Tendon Reflexes: 1+ and symmetric throughout Plantars: Right: downgoing   Left: downgoing Cerebellar: Not tested      Laboratory Studies:   Basic Metabolic Panel: Recent Labs  Lab 07/13/19 0954  NA 141  K 3.9  CL 105  CO2 26  GLUCOSE 102*  BUN 15  CREATININE 1.06*  CALCIUM 9.1    Liver Function Tests: Recent Labs  Lab 07/13/19 1021  AST 23  ALT 18  ALKPHOS 120  BILITOT 1.1  PROT 7.8  ALBUMIN 3.8   No results for input(s): LIPASE, AMYLASE in the last 168 hours. No results for input(s): AMMONIA in the last 168 hours.  CBC: Recent Labs  Lab 07/13/19 0954 07/13/19 1021  WBC 6.4 6.3  NEUTROABS  --  2.5  HGB 12.2 12.2  HCT 37.9 38.5  MCV 84.8 84.8  PLT 220 233    Cardiac Enzymes: No results for input(s): CKTOTAL, CKMB, CKMBINDEX, TROPONINI in the last 168 hours.  BNP: Invalid input(s): POCBNP  CBG: Recent Labs  Lab 07/13/19 1012  GLUCAP 9    Microbiology: Results for orders placed or performed during the hospital encounter of 08/10/17  Urine Culture     Status: None   Collection Time: 08/10/17  5:34 PM   Specimen: Urine, Catheterized  Result Value Ref Range Status   Specimen Description   Final    URINE, CATHETERIZED Performed at [facility], [address] [place], [place] [address]    Special Requests   Final    Normal Performed at [facility], [address] [place], [place] [address]     Culture   Final    NO GROWTH Performed at [facility], [place] [address], [place], [place] [address]    Report Status 08/12/2017 FINAL  Final  Blood Culture (routine x 2)     Status: None   Collection Time: 08/10/17  8:15 PM   Specimen: BLOOD  Result Value Ref Range Status   Specimen Description BLOOD LEFT ARM  Final   Special Requests   Final    BOTTLES DRAWN AEROBIC AND ANAEROBIC Blood Culture results may not be optimal due to an inadequate volume of blood received in culture bottles   Culture   Final    NO GROWTH 5 DAYS Performed at [facility], [address] [place], [place] [address]    Report Status 08/15/2017 FINAL  Final  Blood Culture (routine x 2)     Status: None   Collection Time: 08/10/17  8:15 PM   Specimen: BLOOD  Result Value Ref Range Status   Specimen Description BLOOD LEFT HAND  Final   Special Requests   Final    BOTTLES DRAWN AEROBIC AND ANAEROBIC Blood Culture results may not be optimal due to an inadequate volume of blood received in culture bottles   Culture   Final    NO GROWTH 5 DAYS Performed at [facility], [address], [place], [place] [address]    Report Status 08/15/2017 FINAL  Final    Coagulation Studies: No results for input(s): LABPROT, INR in the last 72 hours.  Urinalysis:  Recent Labs  Lab 07/13/19 1021  COLORURINE YELLOW*  LABSPEC 1.027  PHURINE 5.0  GLUCOSEU NEGATIVE  HGBUR NEGATIVE  BILIRUBINUR NEGATIVE  KETONESUR NEGATIVE  PROTEINUR 30*  NITRITE NEGATIVE  LEUKOCYTESUR NEGATIVE    Lipid Panel:  No results found for: CHOL, TRIG, HDL, CHOLHDL, VLDL, LDLCALC  HgbA1C:  Lab Results  Component Value Date   HGBA1C 6.6 (H) 11/10/2014    Urine Drug Screen:      Component Value Date/Time   LABOPIA NONE DETECTED 07/13/2019 1021   COCAINSCRNUR NONE DETECTED 07/13/2019 1021   LABBENZ NONE DETECTED 07/13/2019 1021   AMPHETMU NONE DETECTED 07/13/2019 1021   THCU NONE DETECTED 07/13/2019 1021    LABBARB NONE DETECTED 07/13/2019 1021    Alcohol Level:  Recent Labs  Lab 07/13/19 1021  ETH <10    Other results: EKG: normal EKG, normal sinus rhythm, unchanged from previous tracings.  Imaging: Dg Chest 1 View  Result Date: 07/13/2019 CLINICAL DATA:  78 year old female with chest tightness and altered mental status. EXAM: CHEST  1 VIEW COMPARISON:  Chest radiograph dated 06/02/2019. FINDINGS: There is cardiomegaly with mild vascular congestion. No edema. Overall interval improvement of the vascular congestion compared to the prior radiograph. Bibasilar atelectatic changes noted. No focal consolidation, pleural effusion or pneumothorax. No acute osseous pathology. Osteopenia with degenerative changes of the shoulders. Right breast surgical clips. IMPRESSION:  1. Cardiomegaly with mild vascular congestion. No edema. 2. No focal consolidation. Electronically Signed   By: Anner Crete M.D.   On: 07/13/2019 10:53   Ct Head Wo Contrast  Result Date: 07/13/2019 CLINICAL DATA:  Altered mental status EXAM: CT HEAD WITHOUT CONTRAST TECHNIQUE: Contiguous axial images were obtained from the base of the skull through the vertex without intravenous contrast. COMPARISON:  08/10/2017 FINDINGS: Brain: No evidence of acute infarction, hemorrhage, hydrocephalus, extra-axial collection or mass lesion/mass effect. Vascular: No hyperdense vessel or unexpected calcification. Skull: Normal. Negative for fracture or focal lesion. Sinuses/Orbits: No acute finding. Other: None. IMPRESSION: No acute intracranial pathology. Electronically Signed   By: Eddie Candle M.D.   On: 07/13/2019 10:36   Ct Abdomen Pelvis W Contrast  Result Date: 07/13/2019 CLINICAL DATA:  Left lower quadrant abdominal pain. Remote history of breast cancer EXAM: CT ABDOMEN AND PELVIS WITH CONTRAST TECHNIQUE: Multidetector CT imaging of the abdomen and pelvis was performed using the standard protocol following bolus administration of  intravenous contrast. CONTRAST:  139mL OMNIPAQUE IOHEXOL 300 MG/ML  SOLN COMPARISON:  11/09/2014 FINDINGS: Lower chest: Mild cardiomegaly. No pericardial effusion. Hepatobiliary: No focal liver abnormality is seen. Cholelithiasis. Gallbladder is not dilated. No pericholecystic inflammatory changes are evident. No biliary dilatation. Pancreas: Pancreas is mildly fatty replaced. No pancreatic ductal dilatation. No peripancreatic inflammatory changes. Spleen: Normal in size without focal abnormality. Adrenals/Urinary Tract: Adrenal glands are unremarkable. Kidneys are normal, without renal calculi, focal lesion, or hydronephrosis. Bladder is unremarkable. Stomach/Bowel: There is extensive diverticulosis involving the sigmoid and distal descending colon with associated long segment colonic wall thickening. No focally inflamed diverticula or pericolonic inflammatory changes/fluid to suggest acute diverticulitis. Small bowel is well opacified with enteric contrast. No dilated loops of bowel. Appendix is not clearly visualized. No pericecal inflammatory changes. Small hiatal hernia. Vascular/Lymphatic: Mild scattered atherosclerotic calcification of the aortoiliac axis. No abdominopelvic lymphadenopathy. Reproductive: Uterus and bilateral adnexa are unremarkable. Other: No abdominal wall hernia or abnormality. No abdominopelvic ascites. Musculoskeletal: Slight lumbar levocurvature with advanced degenerative changes of the lower lumbar spine. No acute osseous findings. IMPRESSION: 1. No acute abdominopelvic findings. 2. Extensive sigmoid and distal descending colonic diverticulosis without evidence of acute diverticulitis. 3. Uncomplicated cholelithiasis. 4. Small hiatal hernia. Electronically Signed   By: Davina Poke M.D.   On: 07/13/2019 13:26     Assessment/Plan:  78 y.o. female with a history of breast cancer, dementia, depression, diabetes, hyperlipidemia, hypertension who presents to the ED for  hypertension, L sided abdominal pain that radiates to her chest pain.  Similar presentation last month. Work up has been negative.  Pt resides in [place].  Neurology asked to evaluate for brief episodes of blank stare  - No clear history of seizures - Partials seizures are usually present in young individuals and not elderly - EEG now. CTH no acute abnormalities - if no abnormalities on routine EEG no further work up at this time from neurology and no anti epileptics at this time.   Leotis Pain  07/13/2019, 2:19 PM

## 2019-07-13 NOTE — ED Provider Notes (Signed)
[facility] Emergency Department Provider Note   ____________________________________________   First MD Initiated Contact with Patient 07/13/19 [phone]     (approximate)  I have reviewed the triage vital signs and the nursing notes.   HISTORY  Chief Complaint Chest Pain    HPI Melanie Young is a 78 y.o. female who is somewhat somnolent when I walk in the room but rapidly wakes up.  She reports she has had lower abdominal pain on the left lower quadrant since yesterday that is getting kind of bad and then this morning about 40 minutes ago started to have chest tightness.  The chest tightness does not appear to be associated with sweating nausea or shortness of breath.  It is fairly tight feeling though.  We sit the patient up in bed better and and she has several episodes where she seems to zone out and almost stop breathing.  She is staring straight ahead good heart rate good pulse but not doing anything else and for several seconds does not respond to me even when I am yelling at her.  I can shake her and then she will respond 10 to 15 seconds later.        Past Medical History:  Diagnosis Date  . Breast cancer ([place]) 1980's   right breast ca with mastectomy  . Chronic kidney disease   . Dementia ([place])   . Depression   . Diabetes mellitus without complication ([place])   . Hypercholesteremia   . Hypertension   . Hypothyroidism   . Osteoporosis     Patient Active Problem List   Diagnosis Date Noted  . ARF (acute renal failure) ([place]) 08/10/2017    Past Surgical History:  Procedure Laterality Date  . BREAST BIOPSY Left 2013   core needle bx, benign  . BREAST SURGERY    . CESAREAN SECTION    . COLONOSCOPY WITH PROPOFOL N/A 08/31/2018   Procedure: COLONOSCOPY WITH PROPOFOL;  Surgeon: Toledo, Benay Pike, MD;  Location: [facility] ENDOSCOPY;  Service: Endoscopy;  Laterality: N/A;  . MASTECTOMY Right 1973   breast ca  . THYROIDECTOMY      Prior to  Admission medications   Medication Sig Start Date End Date Taking? Authorizing Provider  aspirin 325 MG EC tablet Take 325 mg by mouth daily.    [provider]  aspirin 81 MG chewable tablet Chew 1 tablet (81 mg total) by mouth daily. 08/12/17   Demetrios Loll, MD  atorvastatin (LIPITOR) 40 MG tablet Take 40 mg by mouth daily. 07/25/17   [provider]  buPROPion (WELLBUTRIN SR) 150 MG 12 hr tablet Take 1 tablet by mouth 2 (two) times daily. 07/25/17   [provider]  busPIRone (BUSPAR) 10 MG tablet Take 1 tablet by mouth 2 (two) times daily. 07/25/17   [provider]  lamoTRIgine (LAMICTAL) 200 MG tablet Take 1 tablet by mouth daily. 07/25/17   [provider]  levothyroxine (SYNTHROID, LEVOTHROID) 150 MCG tablet Take 150 mcg by mouth daily. 07/25/17   [provider]  linagliptin (TRADJENTA) 5 MG TABS tablet Take 5 mg by mouth daily.    [provider]  losartan (COZAAR) 50 MG tablet Take 50 mg by mouth daily. 07/25/17   [provider]  LYRICA 100 MG capsule Take 100 mg by mouth 2 (two) times daily. 07/31/17   [provider]  metFORMIN (GLUCOPHAGE) 500 MG tablet Take 500 mg by mouth 2 (two) times daily with a meal.  [provider]  omeprazole (PRILOSEC) 20 MG capsule Take 1 capsule by mouth daily. 07/25/17   [provider]  oxybutynin (DITROPAN) 5 MG tablet Take 5 mg by mouth 3 (three) times daily.    [provider]  oxyCODONE (OXY IR/ROXICODONE) 5 MG immediate release tablet Take 1 tablet by mouth 3 (three) times daily. 07/31/17   [provider]  PROAIR HFA 108 (90 Base) MCG/ACT inhaler Take by mouth as directed. 07/31/17   [provider]  QUEtiapine (SEROQUEL) 25 MG tablet Take 25 mg by mouth at bedtime.    [provider]  torsemide (DEMADEX) 20 MG tablet Take 20 mg by mouth daily.    [provider]  trolamine salicylate (ASPERCREME) 10 % cream  Apply 1 application topically as needed for muscle pain.    [provider]  venlafaxine XR (EFFEXOR-XR) 150 MG 24 hr capsule Take 150 mg by mouth daily. 07/25/17   [provider]    Allergies Patient has no known allergies.  Family History  Problem Relation Age of Onset  . Breast cancer Maternal Aunt   . Breast cancer Cousin        3 maternal cousins, 59's    Social History Social History   Tobacco Use  . Smoking status: Never Smoker  . Smokeless tobacco: Never Used  Substance Use Topics  . Alcohol use: Not Currently  . Drug use: Never    Review of Systems  Constitutional: No fever/chills Eyes: No visual changes. ENT: No sore throat. Cardiovascular: Denies chest pain she does report chest tightness. Respiratory: Denies shortness of breath. Gastrointestinal: No abdominal pain except for left lower quadrant pain.  No nausea, no vomiting.  No diarrhea.  No constipation. Genitourinary: Negative for dysuria. Musculoskeletal: Negative for back pain. Skin: Negative for rash. Neurological: Negative for headaches, focal weakness  ____________________________________________   PHYSICAL EXAM:  VITAL SIGNS: ED Triage Vitals  Enc Vitals Group     BP 07/13/19 0956 (!) 197/87     Pulse Rate 07/13/19 0956 (!) 56     Resp 07/13/19 0956 16     Temp 07/13/19 0956 98.4 F (36.9 C)     Temp Source 07/13/19 0956 Oral     SpO2 07/13/19 0956 98 %     Weight 07/13/19 0944 240 lb 1.3 oz (108.9 kg)     Height --      Head Circumference --      Peak Flow --      Pain Score 07/13/19 0943 6     Pain Loc --      Pain Edu? --      Excl. in [place]? --     Constitutional: Alert and oriented. Well appearing and in no acute distress.  See description in HPI Eyes: Conjunctivae are normal. PER. EOMI. Head: Atraumatic. Nose: No congestion/rhinnorhea. Mouth/Throat: Mucous membranes are moist.  Oropharynx non-erythematous. Neck: No stridor.Cardiovascular: Normal rate,  regular rhythm. Grossly normal heart sounds.  Good peripheral circulation. Respiratory: Normal respiratory effort.  No retractions. Lungs CTAB. Gastrointestinal: Soft and nontender except for some tenderness in the left lower quadrant. No distention. No abdominal bruits. No CVA tenderness. Musculoskeletal: No lower extremity tenderness nor edema.  No joint effusions. Neurologic:  Normal speech and language. No gross focal neurologic deficits are appreciated.  See description in HPI Skin:  Skin is warm, dry and intact. No rash noted.   ____________________________________________   LABS (all labs ordered are listed, but only abnormal results are displayed)  Labs Reviewed  BASIC METABOLIC PANEL - Abnormal; Notable for the following components:      Result Value   Glucose, Bld 102 (*)    Creatinine, Ser 1.06 (*)    GFR calc non Af Amer 51 (*)    GFR calc Af Amer 59 (*)    All other components within normal limits  URINALYSIS, COMPLETE (UACMP) WITH MICROSCOPIC - Abnormal; Notable for the following components:   Color, Urine YELLOW (*)    APPearance HAZY (*)    Protein, ur 30 (*)    All other components within normal limits  ACETAMINOPHEN LEVEL - Abnormal; Notable for the following components:   Acetaminophen (Tylenol), Serum <10 (*)    All other components within normal limits  CBC  URINE DRUG SCREEN, QUALITATIVE ([facility] ONLY)  ETHANOL  SALICYLATE LEVEL  LACTIC ACID, PLASMA  DIFFERENTIAL  HEPATIC FUNCTION PANEL  GLUCOSE, CAPILLARY  CBC  BRAIN NATRIURETIC PEPTIDE  LACTIC ACID, PLASMA  CBG MONITORING, ED  TROPONIN I (HIGH SENSITIVITY)  TROPONIN I (HIGH SENSITIVITY)   ____________________________________________  EKG  _EKG read interpreted by me shows sinus bradycardia rate of 51 left axis decreased R wave progression but no marked change from 15 October of this year.  ___________________________________________  RADIOLOGY  ED MD interpretation: Chest x-ray read by  radiology reviewed by me shows cardiomegaly and possibly some venous congestion but the BNP is negative. Head CT read by radiology reviewed by me is negative  Official radiology report(s): Dg Chest 1 View  Result Date: 07/13/2019 CLINICAL DATA:  78 year old female with chest tightness and altered mental status. EXAM: CHEST  1 VIEW COMPARISON:  Chest radiograph dated 06/02/2019. FINDINGS: There is cardiomegaly with mild vascular congestion. No edema. Overall interval improvement of the vascular congestion compared to the prior radiograph. Bibasilar atelectatic changes noted. No focal consolidation, pleural effusion or pneumothorax. No acute osseous pathology. Osteopenia with degenerative changes of the shoulders. Right breast surgical clips. IMPRESSION: 1. Cardiomegaly with mild vascular congestion. No edema. 2. No focal consolidation. Electronically Signed   By: Anner Crete M.D.   On: 07/13/2019 10:53   Ct Head Wo Contrast  Result Date: 07/13/2019 CLINICAL DATA:  Altered mental status EXAM: CT HEAD WITHOUT CONTRAST TECHNIQUE: Contiguous axial images were obtained from the base of the skull through the vertex without intravenous contrast. COMPARISON:  08/10/2017 FINDINGS: Brain: No evidence of acute infarction, hemorrhage, hydrocephalus, extra-axial collection or mass lesion/mass effect. Vascular: No hyperdense vessel or unexpected calcification. Skull: Normal. Negative for fracture or focal lesion. Sinuses/Orbits: No acute finding. Other: None. IMPRESSION: No acute intracranial pathology. Electronically Signed   By: Eddie Candle M.D.   On: 07/13/2019 10:36   Ct Abdomen Pelvis W Contrast  Result Date: 07/13/2019 CLINICAL DATA:  Left lower quadrant abdominal pain. Remote history of breast cancer EXAM: CT ABDOMEN AND PELVIS WITH CONTRAST TECHNIQUE: Multidetector CT imaging of the abdomen and pelvis was performed using the standard protocol following bolus administration of intravenous contrast.  CONTRAST:  123mL OMNIPAQUE IOHEXOL 300 MG/ML  SOLN COMPARISON:  11/09/2014 FINDINGS: Lower chest: Mild cardiomegaly. No pericardial effusion. Hepatobiliary: No focal liver abnormality is seen. Cholelithiasis. Gallbladder is not dilated. No pericholecystic inflammatory changes are evident. No biliary dilatation. Pancreas: Pancreas is mildly fatty replaced. No pancreatic ductal dilatation. No peripancreatic inflammatory changes. Spleen: Normal in size without focal abnormality. Adrenals/Urinary Tract: Adrenal glands are unremarkable. Kidneys are normal, without renal calculi, focal lesion, or hydronephrosis. Bladder is unremarkable. Stomach/Bowel: There is extensive diverticulosis involving the sigmoid  and distal descending colon with associated long segment colonic wall thickening. No focally inflamed diverticula or pericolonic inflammatory changes/fluid to suggest acute diverticulitis. Small bowel is well opacified with enteric contrast. No dilated loops of bowel. Appendix is not clearly visualized. No pericecal inflammatory changes. Small hiatal hernia. Vascular/Lymphatic: Mild scattered atherosclerotic calcification of the aortoiliac axis. No abdominopelvic lymphadenopathy. Reproductive: Uterus and bilateral adnexa are unremarkable. Other: No abdominal wall hernia or abnormality. No abdominopelvic ascites. Musculoskeletal: Slight lumbar levocurvature with advanced degenerative changes of the lower lumbar spine. No acute osseous findings. IMPRESSION: 1. No acute abdominopelvic findings. 2. Extensive sigmoid and distal descending colonic diverticulosis without evidence of acute diverticulitis. 3. Uncomplicated cholelithiasis. 4. Small hiatal hernia. Electronically Signed   By: Davina Poke M.D.   On: 07/13/2019 13:26    ____________________________________________   PROCEDURES  Procedure(s) performed (including Critical Care):  Procedures   ____________________________________________   INITIAL  IMPRESSION / ASSESSMENT AND PLAN / ED COURSE  Melanie Young was evaluated in Emergency Department on 07/13/2019 for the symptoms described in the history of present illness. She was evaluated in the context of the global COVID-19 pandemic, which necessitated consideration that the patient might be at risk for infection with the SARS-CoV-2 virus that causes COVID-19. Institutional protocols and algorithms that pertain to the evaluation of patients at risk for COVID-19 are in a state of rapid change based on information released by regulatory bodies including the CDC and federal and state organizations. These policies and algorithms were followed during the patient's care in the ED. Patient's CT scans do not show any acute pathology.  EEG was reported as normal.  Dr. Irish Elders the neurologist is not worried about her any longer.  I will let her go.  Her troponins are also below the abnormal threshold and actually dropping.  I will give her her Cozaar I believe she missed which is making her blood pressure go up and then we will let her escape.  She can return at anytime for any other problems.             ____________________________________________   FINAL CLINICAL IMPRESSION(S) / ED DIAGNOSES  Final diagnoses:  Chest pain, unspecified type  Left lower quadrant abdominal pain     ED Discharge Orders    None       Note:  This document was prepared using Dragon voice recognition software and may include unintentional dictation errors.    Nena Polio, MD 07/13/19 [phone]

## 2019-07-13 NOTE — ED Triage Notes (Addendum)
Arrives via EMS from [facility]  Initial call was for AMS.  On EMS arrival, patient AAOx3.  NAD.  While on scene, patient c/o chest tightness.  vs wnl.  Patient AAOx3.  Skin warm and dry.  No SOB/ DOE.  Also c/o LLQ abdominal/pelvic pain x 1 day.

## 2019-09-10 ENCOUNTER — Emergency Department: Payer: Medicare Other

## 2019-09-10 ENCOUNTER — Encounter: Payer: Self-pay | Admitting: Emergency Medicine

## 2019-09-10 ENCOUNTER — Inpatient Hospital Stay
Admission: EM | Admit: 2019-09-10 | Discharge: 2019-09-12 | DRG: 309 | Disposition: A | Discharge status: Skilled Nursing Facility | Payer: Medicare Other | Source: Skilled Nursing Facility | Attending: Internal Medicine | Admitting: Internal Medicine

## 2019-09-10 ENCOUNTER — Other Ambulatory Visit: Payer: Self-pay

## 2019-09-10 DIAGNOSIS — Z803 Family history of malignant neoplasm of breast: Secondary | ICD-10-CM

## 2019-09-10 DIAGNOSIS — N1832 Chronic kidney disease, stage 3b: Secondary | ICD-10-CM | POA: Diagnosis present

## 2019-09-10 DIAGNOSIS — R001 Bradycardia, unspecified: Principal | ICD-10-CM

## 2019-09-10 DIAGNOSIS — I5032 Chronic diastolic (congestive) heart failure: Secondary | ICD-10-CM | POA: Diagnosis present

## 2019-09-10 DIAGNOSIS — Z9011 Acquired absence of right breast and nipple: Secondary | ICD-10-CM

## 2019-09-10 DIAGNOSIS — E785 Hyperlipidemia, unspecified: Secondary | ICD-10-CM | POA: Diagnosis present

## 2019-09-10 DIAGNOSIS — T43595A Adverse effect of other antipsychotics and neuroleptics, initial encounter: Secondary | ICD-10-CM | POA: Diagnosis present

## 2019-09-10 DIAGNOSIS — R42 Dizziness and giddiness: Secondary | ICD-10-CM

## 2019-09-10 DIAGNOSIS — W19XXXA Unspecified fall, initial encounter: Secondary | ICD-10-CM | POA: Diagnosis not present

## 2019-09-10 DIAGNOSIS — S50311A Abrasion of right elbow, initial encounter: Secondary | ICD-10-CM | POA: Diagnosis present

## 2019-09-10 DIAGNOSIS — Z79890 Hormone replacement therapy (postmenopausal): Secondary | ICD-10-CM

## 2019-09-10 DIAGNOSIS — E875 Hyperkalemia: Secondary | ICD-10-CM | POA: Diagnosis present

## 2019-09-10 DIAGNOSIS — N390 Urinary tract infection, site not specified: Secondary | ICD-10-CM

## 2019-09-10 DIAGNOSIS — E7800 Pure hypercholesterolemia, unspecified: Secondary | ICD-10-CM | POA: Diagnosis present

## 2019-09-10 DIAGNOSIS — Z7982 Long term (current) use of aspirin: Secondary | ICD-10-CM

## 2019-09-10 DIAGNOSIS — Z9181 History of falling: Secondary | ICD-10-CM

## 2019-09-10 DIAGNOSIS — E1122 Type 2 diabetes mellitus with diabetic chronic kidney disease: Secondary | ICD-10-CM | POA: Diagnosis present

## 2019-09-10 DIAGNOSIS — Z20822 Contact with and (suspected) exposure to covid-19: Secondary | ICD-10-CM | POA: Diagnosis present

## 2019-09-10 DIAGNOSIS — E890 Postprocedural hypothyroidism: Secondary | ICD-10-CM | POA: Diagnosis present

## 2019-09-10 DIAGNOSIS — G8929 Other chronic pain: Secondary | ICD-10-CM | POA: Diagnosis present

## 2019-09-10 DIAGNOSIS — M810 Age-related osteoporosis without current pathological fracture: Secondary | ICD-10-CM | POA: Diagnosis present

## 2019-09-10 DIAGNOSIS — T426X5A Adverse effect of other antiepileptic and sedative-hypnotic drugs, initial encounter: Secondary | ICD-10-CM | POA: Diagnosis present

## 2019-09-10 DIAGNOSIS — F0390 Unspecified dementia without behavioral disturbance: Secondary | ICD-10-CM | POA: Diagnosis present

## 2019-09-10 DIAGNOSIS — F329 Major depressive disorder, single episode, unspecified: Secondary | ICD-10-CM | POA: Diagnosis present

## 2019-09-10 DIAGNOSIS — N179 Acute kidney failure, unspecified: Secondary | ICD-10-CM | POA: Diagnosis not present

## 2019-09-10 DIAGNOSIS — I130 Hypertensive heart and chronic kidney disease with heart failure and stage 1 through stage 4 chronic kidney disease, or unspecified chronic kidney disease: Secondary | ICD-10-CM | POA: Diagnosis present

## 2019-09-10 DIAGNOSIS — R55 Syncope and collapse: Secondary | ICD-10-CM | POA: Diagnosis present

## 2019-09-10 DIAGNOSIS — Y92099 Unspecified place in other non-institutional residence as the place of occurrence of the external cause: Secondary | ICD-10-CM

## 2019-09-10 DIAGNOSIS — E860 Dehydration: Secondary | ICD-10-CM | POA: Diagnosis present

## 2019-09-10 DIAGNOSIS — Z79891 Long term (current) use of opiate analgesic: Secondary | ICD-10-CM

## 2019-09-10 DIAGNOSIS — Z79899 Other long term (current) drug therapy: Secondary | ICD-10-CM

## 2019-09-10 DIAGNOSIS — Z853 Personal history of malignant neoplasm of breast: Secondary | ICD-10-CM

## 2019-09-10 DIAGNOSIS — T402X5A Adverse effect of other opioids, initial encounter: Secondary | ICD-10-CM | POA: Diagnosis present

## 2019-09-10 LAB — URINALYSIS, COMPLETE (UACMP) WITH MICROSCOPIC
Bilirubin Urine: NEGATIVE
Glucose, UA: NEGATIVE mg/dL
Hgb urine dipstick: NEGATIVE
Ketones, ur: NEGATIVE mg/dL
Nitrite: NEGATIVE
Protein, ur: NEGATIVE mg/dL
Specific Gravity, Urine: 1.016 (ref 1.005–1.030)
pH: 5 (ref 5.0–8.0)

## 2019-09-10 LAB — BASIC METABOLIC PANEL
Anion gap: 11 (ref 5–15)
BUN: 35 mg/dL — ABNORMAL HIGH (ref 8–23)
CO2: 27 mmol/L (ref 22–32)
Calcium: 9.4 mg/dL (ref 8.9–10.3)
Chloride: 102 mmol/L (ref 98–111)
Creatinine, Ser: 2.36 mg/dL — ABNORMAL HIGH (ref 0.44–1.00)
GFR calc Af Amer: 22 mL/min — ABNORMAL LOW (ref >60)
GFR calc non Af Amer: 19 mL/min — ABNORMAL LOW (ref >60)
Glucose, Bld: 97 mg/dL (ref 70–99)
Potassium: 4.6 mmol/L (ref 3.5–5.1)
Sodium: 140 mmol/L (ref 135–145)

## 2019-09-10 LAB — CBC WITH DIFFERENTIAL/PLATELET
Abs Immature Granulocytes: 0.01 10*3/uL (ref 0.00–0.07)
Basophils Absolute: 0 10*3/uL (ref 0.0–0.1)
Basophils Relative: 0 %
Eosinophils Absolute: 0.3 10*3/uL (ref 0.0–0.5)
Eosinophils Relative: 5 %
HCT: 43.1 % (ref 36.0–46.0)
Hemoglobin: 13.5 g/dL (ref 12.0–15.0)
Immature Granulocytes: 0 %
Lymphocytes Relative: 49 %
Lymphs Abs: 2.6 10*3/uL (ref 0.7–4.0)
MCH: 26.7 pg (ref 26.0–34.0)
MCHC: 31.3 g/dL (ref 30.0–36.0)
MCV: 85.2 fL (ref 80.0–100.0)
Monocytes Absolute: 0.4 10*3/uL (ref 0.1–1.0)
Monocytes Relative: 8 %
Neutro Abs: 2 10*3/uL (ref 1.7–7.7)
Neutrophils Relative %: 38 %
Platelets: 244 10*3/uL (ref 150–400)
RBC: 5.06 MIL/uL (ref 3.87–5.11)
RDW: 16.2 % — ABNORMAL HIGH (ref 11.5–15.5)
WBC: 5.3 10*3/uL (ref 4.0–10.5)
nRBC: 0 % (ref 0.0–0.2)

## 2019-09-10 LAB — GLUCOSE, CAPILLARY
Glucose-Capillary: 123 mg/dL — ABNORMAL HIGH (ref 70–99)
Glucose-Capillary: 147 mg/dL — ABNORMAL HIGH (ref 70–99)

## 2019-09-10 LAB — TROPONIN I (HIGH SENSITIVITY): Troponin I (High Sensitivity): 19 ng/L — ABNORMAL HIGH (ref <18)

## 2019-09-10 LAB — HEMOGLOBIN A1C
Hgb A1c MFr Bld: 6.3 % — ABNORMAL HIGH (ref 4.8–5.6)
Mean Plasma Glucose: 134.11 mg/dL

## 2019-09-10 LAB — TSH: TSH: 19.201 u[IU]/mL — ABNORMAL HIGH (ref 0.350–4.500)

## 2019-09-10 MED ORDER — BUSPIRONE HCL 10 MG PO TABS
10.0000 mg | ORAL_TABLET | Freq: Two times a day (BID) | ORAL | Status: DC
  Administered 2019-09-10 – 2019-09-12 (×4): 10 mg via ORAL
  Filled 2019-09-10 (×6): qty 1

## 2019-09-10 MED ORDER — QUETIAPINE FUMARATE 25 MG PO TABS
50.0000 mg | ORAL_TABLET | Freq: Every day | ORAL | Status: DC
  Administered 2019-09-10 – 2019-09-11 (×2): 50 mg via ORAL
  Filled 2019-09-10 (×2): qty 2

## 2019-09-10 MED ORDER — LAMOTRIGINE 100 MG PO TABS
200.0000 mg | ORAL_TABLET | Freq: Every day | ORAL | Status: DC
  Administered 2019-09-11 – 2019-09-12 (×2): 200 mg via ORAL
  Filled 2019-09-10 (×2): qty 2

## 2019-09-10 MED ORDER — ATORVASTATIN CALCIUM 20 MG PO TABS
40.0000 mg | ORAL_TABLET | Freq: Every day | ORAL | Status: DC
  Administered 2019-09-10 – 2019-09-11 (×2): 40 mg via ORAL
  Filled 2019-09-10 (×2): qty 2

## 2019-09-10 MED ORDER — OXYCODONE HCL 5 MG PO TABS
10.0000 mg | ORAL_TABLET | Freq: Three times a day (TID) | ORAL | Status: DC
  Administered 2019-09-10 – 2019-09-12 (×6): 10 mg via ORAL
  Filled 2019-09-10 (×6): qty 2

## 2019-09-10 MED ORDER — OXYCODONE-ACETAMINOPHEN 5-325 MG PO TABS
1.0000 | ORAL_TABLET | Freq: Once | ORAL | Status: AC
  Administered 2019-09-10: 1 via ORAL
  Filled 2019-09-10: qty 1

## 2019-09-10 MED ORDER — ACETAMINOPHEN 650 MG RE SUPP: 650.0000 mg | Freq: Four times a day (QID) | RECTAL | Status: DC | PRN

## 2019-09-10 MED ORDER — HYDRALAZINE HCL 25 MG PO TABS
25.0000 mg | ORAL_TABLET | Freq: Three times a day (TID) | ORAL | Status: DC
  Administered 2019-09-10 – 2019-09-12 (×7): 25 mg via ORAL
  Filled 2019-09-10 (×7): qty 1

## 2019-09-10 MED ORDER — INSULIN ASPART 100 UNIT/ML ~~LOC~~ SOLN: 0.0000 [IU] | Freq: Every day | SUBCUTANEOUS | Status: DC

## 2019-09-10 MED ORDER — SODIUM CHLORIDE 0.9 % IV BOLUS
500.0000 mL | Freq: Once | INTRAVENOUS | Status: AC
  Administered 2019-09-10: 500 mL via INTRAVENOUS

## 2019-09-10 MED ORDER — POLYETHYLENE GLYCOL 3350 17 G PO PACK: 17.0000 g | PACK | Freq: Every day | ORAL | Status: DC | PRN

## 2019-09-10 MED ORDER — SODIUM CHLORIDE 0.9 % IV SOLN
1.0000 g | INTRAVENOUS | Status: DC
  Administered 2019-09-11: 1 g via INTRAVENOUS
  Filled 2019-09-10: qty 1
  Filled 2019-09-10: qty 10

## 2019-09-10 MED ORDER — OXYBUTYNIN CHLORIDE 5 MG PO TABS
2.5000 mg | ORAL_TABLET | Freq: Two times a day (BID) | ORAL | Status: DC
  Administered 2019-09-10 – 2019-09-12 (×4): 2.5 mg via ORAL
  Filled 2019-09-10 (×6): qty 0.5

## 2019-09-10 MED ORDER — PREGABALIN 50 MG PO CAPS
100.0000 mg | ORAL_CAPSULE | Freq: Two times a day (BID) | ORAL | Status: DC
  Administered 2019-09-10 – 2019-09-12 (×4): 100 mg via ORAL
  Filled 2019-09-10 (×4): qty 2

## 2019-09-10 MED ORDER — SODIUM CHLORIDE 0.9 % IV SOLN
1.0000 g | Freq: Once | INTRAVENOUS | Status: AC
  Administered 2019-09-10: 1 g via INTRAVENOUS
  Filled 2019-09-10: qty 10

## 2019-09-10 MED ORDER — LEVOTHYROXINE SODIUM 50 MCG PO TABS
150.0000 ug | ORAL_TABLET | Freq: Every day | ORAL | Status: DC
  Administered 2019-09-11: 150 ug via ORAL
  Filled 2019-09-10: qty 1

## 2019-09-10 MED ORDER — ACETAMINOPHEN 325 MG PO TABS
650.0000 mg | ORAL_TABLET | Freq: Four times a day (QID) | ORAL | Status: DC | PRN
  Administered 2019-09-10: 650 mg via ORAL
  Filled 2019-09-10: qty 2

## 2019-09-10 MED ORDER — PANTOPRAZOLE SODIUM 40 MG PO TBEC
40.0000 mg | DELAYED_RELEASE_TABLET | Freq: Every day | ORAL | Status: DC
  Administered 2019-09-11 – 2019-09-12 (×2): 40 mg via ORAL
  Filled 2019-09-10 (×2): qty 1

## 2019-09-10 MED ORDER — INSULIN ASPART 100 UNIT/ML ~~LOC~~ SOLN
0.0000 [IU] | Freq: Three times a day (TID) | SUBCUTANEOUS | Status: DC
  Administered 2019-09-10 – 2019-09-12 (×4): 1 [IU] via SUBCUTANEOUS
  Filled 2019-09-10 (×4): qty 1

## 2019-09-10 MED ORDER — ENOXAPARIN SODIUM 30 MG/0.3ML ~~LOC~~ SOLN
30.0000 mg | SUBCUTANEOUS | Status: DC
  Administered 2019-09-10: 30 mg via SUBCUTANEOUS
  Filled 2019-09-10: qty 0.3

## 2019-09-10 MED ORDER — ASPIRIN EC 325 MG PO TBEC
325.0000 mg | DELAYED_RELEASE_TABLET | Freq: Every day | ORAL | Status: DC
  Administered 2019-09-11 – 2019-09-12 (×2): 325 mg via ORAL
  Filled 2019-09-10 (×2): qty 1

## 2019-09-10 MED ORDER — SODIUM CHLORIDE 0.9% FLUSH
3.0000 mL | Freq: Two times a day (BID) | INTRAVENOUS | Status: DC
  Administered 2019-09-10 – 2019-09-12 (×5): 3 mL via INTRAVENOUS

## 2019-09-10 MED ORDER — LORAZEPAM 0.5 MG PO TABS
0.5000 mg | ORAL_TABLET | Freq: Two times a day (BID) | ORAL | Status: DC | PRN
  Administered 2019-09-10: 0.5 mg via ORAL
  Filled 2019-09-10: qty 1

## 2019-09-10 NOTE — ED Triage Notes (Signed)
Pt to ED by EMS from [facility] after an unwitnessed fall. Pt has c/o of right hip pain and right hip/shoulder pain. Pt has hx of dementia.

## 2019-09-10 NOTE — ED Provider Notes (Signed)
[facility] Emergency Department Provider Note  ____________________________________________   First MD Initiated Contact with Patient 09/10/19 [phone]     (approximate)  I have reviewed the triage vital signs and the nursing notes.  History  Chief Complaint Fall    HPI Melanie Young is a 79 y.o. female with PMHx as below who presents for a fall with resultant right sided hip pain. Patient states she was walking down the hall when she became dizzy and lightheaded, presyncopal, causing her to fall. She fell onto her R sided. Has a scrape to her R elbow and hip area, where she is experiencing some dull pain. Constant, no radiation, no alleviating/aggravating components, has not taken anything for it. She denies any head injury. Not on blood thinners.    Past Medical Hx Past Medical History:  Diagnosis Date  . Breast cancer ([place]) 1980's   right breast ca with mastectomy  . Chronic kidney disease   . Dementia ([place])   . Depression   . Diabetes mellitus without complication ([place])   . Hypercholesteremia   . Hypertension   . Hypothyroidism   . Osteoporosis     Problem List Patient Active Problem List   Diagnosis Date Noted  . ARF (acute renal failure) ([place]) 08/10/2017    Past Surgical Hx Past Surgical History:  Procedure Laterality Date  . BREAST BIOPSY Left 2013   core needle bx, benign  . BREAST SURGERY    . CESAREAN SECTION    . COLONOSCOPY WITH PROPOFOL N/A 08/31/2018   Procedure: COLONOSCOPY WITH PROPOFOL;  Surgeon: Toledo, Benay Pike, MD;  Location: [facility] ENDOSCOPY;  Service: Endoscopy;  Laterality: N/A;  . MASTECTOMY Right 1973   breast ca  . THYROIDECTOMY      Medications Prior to Admission medications   Medication Sig Start Date End Date Taking? Authorizing Provider  acetaminophen (TYLENOL) 325 MG tablet Take 650 mg by mouth every 4 (four) hours as needed for mild pain.   Yes [provider]  acetaminophen (TYLENOL) 500 MG  tablet Take 500 mg by mouth every 6 (six) hours as needed for mild pain or fever.   Yes [provider]  alum & mag hydroxide-simeth (MAALOX/MYLANTA) 200-200-20 MG/5ML suspension Take 30 mLs by mouth every 6 (six) hours as needed for indigestion or heartburn.   Yes [provider]  aspirin 325 MG EC tablet Take 325 mg by mouth daily.   Yes [provider]  atorvastatin (LIPITOR) 40 MG tablet Take 40 mg by mouth at bedtime.    Yes [provider]  busPIRone (BUSPAR) 10 MG tablet Take 10 mg by mouth 2 (two) times daily.    Yes [provider]  calcium carbonate (TUMS - DOSED IN MG ELEMENTAL CALCIUM) 500 MG chewable tablet Chew 1 tablet by mouth 2 (two) times daily.   Yes [provider]  cholecalciferol (VITAMIN D3) 25 MCG (1000 UNIT) tablet Take 1,000 Units by mouth daily.   Yes [provider]  guaifenesin (ROBITUSSIN) 100 MG/5ML syrup Take 200 mg by mouth every 6 (six) hours as needed for cough.   Yes [provider]  lamoTRIgine (LAMICTAL) 200 MG tablet Take 200 mg by mouth daily.    Yes [provider]  levothyroxine (SYNTHROID, LEVOTHROID) 150 MCG tablet Take 150 mcg by mouth daily. 07/25/17  Yes [provider]  linagliptin (TRADJENTA) 5 MG TABS tablet Take 5 mg by mouth daily.   Yes [provider]  loperamide (IMODIUM) 2 MG  capsule Take 2 mg by mouth as needed for diarrhea or loose stools ((max 8 doses in 24 hours)).   Yes [provider]  LORazepam (ATIVAN) 0.5 MG tablet Take 0.5 mg by mouth 2 (two) times daily as needed for anxiety (agitation).   Yes [provider]  losartan (COZAAR) 50 MG tablet Take 50 mg by mouth daily. 07/25/17  Yes [provider]  LYRICA 100 MG capsule Take 100 mg by mouth 2 (two) times daily.   Yes [provider]  magnesium hydroxide (MILK OF MAGNESIA) 400 MG/5ML suspension Take 30 mLs by mouth at bedtime as needed for mild constipation  or moderate constipation.   Yes [provider]  metFORMIN (GLUCOPHAGE) 500 MG tablet Take 750 mg by mouth daily.    Yes [provider]  neomycin-bacitracin-polymyxin (NEOSPORIN) ointment Apply 1 application topically as needed for wound care.   Yes [provider]  omeprazole (PRILOSEC) 20 MG capsule Take 20 mg by mouth daily.    Yes [provider]  ondansetron (ZOFRAN) 4 MG tablet Take 4 mg by mouth every 6 (six) hours as needed for nausea or vomiting.   Yes [provider]  oxybutynin (DITROPAN) 5 MG tablet Take 2.5 mg by mouth 2 (two) times daily.    Yes [provider]  Oxycodone HCl 10 MG TABS Take 10 mg by mouth 3 (three) times daily.    Yes [provider]  PROAIR HFA 108 (90 Base) MCG/ACT inhaler Inhale 1 puff into the lungs every 6 (six) hours as needed for wheezing or shortness of breath.    Yes [provider]  QUEtiapine (SEROQUEL) 50 MG tablet Take 50 mg by mouth at bedtime.    Yes [provider]  QUEtiapine (SEROQUEL) 50 MG tablet Take 25 mg by mouth daily.   Yes [provider]  torsemide (DEMADEX) 20 MG tablet Take 20 mg by mouth daily.   Yes [provider]  trolamine salicylate (ASPERCREME) 10 % cream Apply 1 application topically 3 (three) times daily as needed for muscle pain.    Yes [provider]    Allergies Patient has no known allergies.  Family Hx Family History  Problem Relation Age of Onset  . Breast cancer Maternal Aunt   . Breast cancer Cousin        3 maternal cousins, 67's    Social Hx Social History   Tobacco Use  . Smoking status: Never Smoker  . Smokeless tobacco: Never Used  Substance Use Topics  . Alcohol use: Not Currently  . Drug use: Never     Review of Systems  Constitutional: Negative for fever, chills. Eyes: Negative for visual changes. ENT: Negative for sore throat. Cardiovascular: Negative for chest pain. Respiratory:  Negative for shortness of breath. Gastrointestinal: Negative for nausea, vomiting.  Genitourinary: Negative for dysuria. Musculoskeletal: + R hip pain Skin: + abrasion Neurological: Negative for for headaches.   Physical Exam  Vital Signs: ED Triage Vitals  Enc Vitals Group     BP 09/10/19 0906 (!) 179/106     Pulse Rate 09/10/19 0906 (!) 49     Resp 09/10/19 0906 20     Temp 09/10/19 0906 (!) 97.5 F (36.4 C)     Temp src --      SpO2 09/10/19 0859 95 %     Weight --      Height --      Head Circumference --      Peak  Flow --      Pain Score --      Pain Loc --      Pain Edu? --      Excl. in [place]? --     Constitutional: Alert and oriented.  Head: Normocephalic. Atraumatic. Eyes: Conjunctivae clear. Sclera anicteric. Nose: No congestion. No rhinorrhea. Mouth/Throat: Wearing mask.  Neck: No stridor.  No midline CS tenderness.  Cardiovascular: Bradycardic. Extremities well perfused. Respiratory: Normal respiratory effort.  Lungs CTAB. Gastrointestinal: Soft. Non-tender. Non-distended.  Musculoskeletal: No lower extremity edema. No deformities. TTP about the R hip. FROM to bilateral shoulders, elbows, wrists, knees, ankles. FROM at L hip. ROM at R hip deferred 2/2 pain. No obvious deformity or rotational shortening.  Neurologic:  Normal speech and language. No gross focal neurologic deficits are appreciated.  Skin: Superficial abrasion to R elbow. No laceration.  Psychiatric: Mood and affect are appropriate for situation.  EKG  Personally reviewed.   Rate: 45 Rhythm: sinus Axis: LAD Intervals: QRS 110 ms Sinus bradycardia, borderline QRS No STEMI    Radiology  CT head/CS: IMPRESSION: No acute intracranial abnormality Negative for cervical spine fracture.  XR hip: IMPRESSION: 1. No acute findings. No osseous fracture or dislocation. 2. Mild degenerative change at the bilateral hip joints.   Procedures  Procedure(s) performed (including critical  care):  Procedures   Initial Impression / Assessment and Plan / ED Course  79 y.o. female who presents to the ED for fall 2/2 dizziness/lightheadedness, and R hip pain.  Will obtain imaging to r/o any acute traumatic injuries, and basic labs/work up to evaluate for etiology of patient's fall.  Imaging negative. Patient noted to be bradycardic on EKG (previously bradycardic to 50s on prior EKG) and on monitor she does get as low as the 38-39 heart rate. She does not appear to be on any nodal agents. Work up also reveals AKI and possible UTI, will treat and plan for admission for further treatment and telemonitoring, given concern for possible symptomatic sinus bradycardia leading to fall. Discussed w/ hospitalist for admission.   Final Clinical Impression(s) / ED Diagnosis  Final diagnoses:  Fall, initial encounter  Dizziness  Urinary tract infection in elderly patient  AKI (acute kidney injury) ([place])       Note:  This document was prepared using Dragon voice recognition software and may include unintentional dictation errors.   Lilia Pro., MD 09/10/19 [phone]

## 2019-09-10 NOTE — ED Notes (Addendum)
Pt unable to stand at this time to finish orthostatic vitals complaining of upper posterior leg pain.

## 2019-09-10 NOTE — H&P (Addendum)
History and Physical    Melanie Young [id number] DOB: July 14, 1941 DOA: 09/10/2019  PCP: System, Pcp Not In   Patient coming from: [place] have personally briefly reviewed patient's old medical records in [place]  Chief Complaint: Unwitnessed fall.  HPI: Melanie Young is a 79 y.o. female with medical history significant of breast cancer, dementia, depression, diabetes, hyperlipidemia, hypertension and CKD came to ED after having a unwitnessed fall at her facility. Patient felt a little dizzy before falling, hurt her right side and complaining of right hip pain.  According to patient she has 2 falls in the prior week with similar symptoms.  She denies any chest pain or shortness of breath.  Denies any orthopnea or PND.  Denies any recent illnesses, upper respiratory symptoms, fever or chills.  No nausea, vomiting or diarrhea.  She do endorse some dysuria, no abdominal pain or hematuria.  She does not drink enough water on a daily basis.  Her appetite is normal.  ED Course: Hemodynamically stable, labs only positive for mildly creatinine at 2.36 with baseline around 1, imaging negative for any acute fracture, EKG with sinus bradycardia which seems chronic.  Her heart rate was dropping in high 30s on monitor and admitted for symptomatic bradycardia resulted in fall.  Review of Systems: As per HPI otherwise 10 point review of systems negative.   Past Medical History:  Diagnosis Date  . Breast cancer ([place]) 1980's   right breast ca with mastectomy  . Chronic kidney disease   . Dementia ([place])   . Depression   . Diabetes mellitus without complication ([place])   . Hypercholesteremia   . Hypertension   . Hypothyroidism   . Osteoporosis     Past Surgical History:  Procedure Laterality Date  . BREAST BIOPSY Left 2013   core needle bx, benign  . BREAST SURGERY    . CESAREAN SECTION    . COLONOSCOPY WITH PROPOFOL N/A 08/31/2018   Procedure: COLONOSCOPY WITH PROPOFOL;   Surgeon: Toledo, Benay Pike, MD;  Location: [facility] ENDOSCOPY;  Service: Endoscopy;  Laterality: N/A;  . MASTECTOMY Right 1973   breast ca  . THYROIDECTOMY       reports that she has never smoked. She has never used smokeless tobacco. She reports previous alcohol use. She reports that she does not use drugs.  No Known Allergies  Family History  Problem Relation Age of Onset  . Breast cancer Maternal Aunt   . Breast cancer Cousin        3 maternal cousins, 29's    Prior to Admission medications   Medication Sig Start Date End Date Taking? Authorizing Provider  acetaminophen (TYLENOL) 325 MG tablet Take 650 mg by mouth every 4 (four) hours as needed for mild pain.   Yes [provider]  acetaminophen (TYLENOL) 500 MG tablet Take 500 mg by mouth every 6 (six) hours as needed for mild pain or fever.   Yes [provider]  alum & mag hydroxide-simeth (MAALOX/MYLANTA) 200-200-20 MG/5ML suspension Take 30 mLs by mouth every 6 (six) hours as needed for indigestion or heartburn.   Yes [provider]  aspirin 325 MG EC tablet Take 325 mg by mouth daily.   Yes [provider]  atorvastatin (LIPITOR) 40 MG tablet Take 40 mg by mouth at bedtime.    Yes [provider]  busPIRone (BUSPAR) 10 MG tablet Take 10 mg by mouth 2 (two) times daily.    Yes [provider]  calcium carbonate (TUMS - DOSED IN MG ELEMENTAL CALCIUM) 500 MG chewable tablet Chew 1 tablet by mouth 2 (two) times daily.   Yes [provider]  cholecalciferol (VITAMIN D3) 25 MCG (1000 UNIT) tablet Take 1,000 Units by mouth daily.   Yes [provider]  guaifenesin (ROBITUSSIN) 100 MG/5ML syrup Take 200 mg by mouth every 6 (six) hours as needed for cough.   Yes [provider]  lamoTRIgine (LAMICTAL) 200 MG tablet Take 200 mg by mouth daily.    Yes [provider]  levothyroxine (SYNTHROID, LEVOTHROID) 150 MCG tablet Take 150 mcg by mouth daily.  07/25/17  Yes [provider]  linagliptin (TRADJENTA) 5 MG TABS tablet Take 5 mg by mouth daily.   Yes [provider]  loperamide (IMODIUM) 2 MG capsule Take 2 mg by mouth as needed for diarrhea or loose stools ((max 8 doses in 24 hours)).   Yes [provider]  LORazepam (ATIVAN) 0.5 MG tablet Take 0.5 mg by mouth 2 (two) times daily as needed for anxiety (agitation).   Yes [provider]  losartan (COZAAR) 50 MG tablet Take 50 mg by mouth daily. 07/25/17  Yes [provider]  LYRICA 100 MG capsule Take 100 mg by mouth 2 (two) times daily.   Yes [provider]  magnesium hydroxide (MILK OF MAGNESIA) 400 MG/5ML suspension Take 30 mLs by mouth at bedtime as needed for mild constipation or moderate constipation.   Yes [provider]  metFORMIN (GLUCOPHAGE) 500 MG tablet Take 750 mg by mouth daily.    Yes [provider]  neomycin-bacitracin-polymyxin (NEOSPORIN) ointment Apply 1 application topically as needed for wound care.   Yes [provider]  omeprazole (PRILOSEC) 20 MG capsule Take 20 mg by mouth daily.    Yes [provider]  ondansetron (ZOFRAN) 4 MG tablet Take 4 mg by mouth every 6 (six) hours as needed for nausea or vomiting.   Yes [provider]  oxybutynin (DITROPAN) 5 MG tablet Take 2.5 mg by mouth 2 (two) times daily.    Yes [provider]  Oxycodone HCl 10 MG TABS Take 10 mg by mouth 3 (three) times daily.    Yes [provider]  PROAIR HFA 108 (90 Base) MCG/ACT inhaler Inhale 1 puff into the lungs every 6 (six) hours as needed for wheezing or shortness of breath.    Yes [provider]  QUEtiapine (SEROQUEL) 50 MG tablet Take 50 mg by mouth at bedtime.    Yes [provider]  QUEtiapine (SEROQUEL) 50 MG tablet Take 25 mg by mouth daily.   Yes [provider]  torsemide (DEMADEX) 20 MG tablet Take 20 mg by mouth daily.   Yes [provider]  trolamine salicylate (ASPERCREME) 10 % cream Apply 1 application topically 3 (three) times daily as needed for muscle pain.    Yes [provider]    Physical Exam: Vitals:   09/10/19 0859 09/10/19 0906  BP:  (!) 179/106  Pulse:  (!) 49  Resp:  20  Temp:  (!) 97.5 F (36.4 C)  SpO2: 95% 98%    General: Vital signs reviewed.  Patient is well-developed and well-nourished obese lady, in no acute distress and cooperative with exam.  Head: Normocephalic and atraumatic. Eyes: EOMI, conjunctivae normal, no scleral icterus.  ENMT: Mucous membranes are moist.  Neck: Supple, trachea midline, normal ROM, no JVD, masses, thyromegaly, or carotid bruit present.  Cardiovascular: RRR, S1 normal,  S2 normal, no murmurs, gallops, or rubs. Pulmonary/Chest: Clear to auscultation bilaterally, no wheezes, rales, or rhonchi. Abdominal: Soft, non-tender, non-distended, BS +,  Extremities: No lower extremity edema bilaterally,  pulses symmetric and intact bilaterally. No cyanosis or clubbing. Neurological: A&O x3, Strength is normal and symmetric bilaterally, cranial nerve II-XII are grossly intact, no focal motor deficit, sensory intact to light touch bilaterally.  Skin: Dry with some flaky skin on all extremities. Psychiatric: Patient appears anxious and tearful.  Labs on Admission: I have personally reviewed following labs and imaging studies  CBC: Recent Labs  Lab 09/10/19 1334  WBC 5.3  NEUTROABS 2.0  HGB 13.5  HCT 43.1  MCV 85.2  PLT [id number]   Basic Metabolic Panel: Recent Labs  Lab 09/10/19 1334  NA 140  K 4.6  CL 102  CO2 27  GLUCOSE 97  BUN 35*  CREATININE 2.36*  CALCIUM 9.4   GFR: CrCl cannot be calculated (Unknown ideal weight.). Liver Function Tests: No results for input(s): AST, ALT, ALKPHOS, BILITOT, PROT, ALBUMIN in the last 168 hours. No results for input(s): LIPASE, AMYLASE in the last 168 hours. No results for input(s): AMMONIA in the last 168  hours. Coagulation Profile: No results for input(s): INR, PROTIME in the last 168 hours. Cardiac Enzymes: No results for input(s): CKTOTAL, CKMB, CKMBINDEX, TROPONINI in the last 168 hours. BNP (last 3 results) No results for input(s): PROBNP in the last 8760 hours. HbA1C: No results for input(s): HGBA1C in the last 72 hours. CBG: No results for input(s): GLUCAP in the last 168 hours. Lipid Profile: No results for input(s): CHOL, HDL, LDLCALC, TRIG, CHOLHDL, LDLDIRECT in the last 72 hours. Thyroid Function Tests: No results for input(s): TSH, T4TOTAL, FREET4, T3FREE, THYROIDAB in the last 72 hours. Anemia Panel: No results for input(s): VITAMINB12, FOLATE, FERRITIN, TIBC, IRON, RETICCTPCT in the last 72 hours. Urine analysis:    Component Value Date/Time   COLORURINE YELLOW (A) 09/10/2019 1137   APPEARANCEUR HAZY (A) 09/10/2019 1137   APPEARANCEUR Cloudy 11/09/2014 2357   LABSPEC 1.016 09/10/2019 1137   LABSPEC 1.013 11/09/2014 2357   PHURINE 5.0 09/10/2019 1137   GLUCOSEU NEGATIVE 09/10/2019 1137   GLUCOSEU Negative 11/09/2014 2357   HGBUR NEGATIVE 09/10/2019 1137   BILIRUBINUR NEGATIVE 09/10/2019 1137   BILIRUBINUR Negative 11/09/2014 2357   KETONESUR NEGATIVE 09/10/2019 1137   PROTEINUR NEGATIVE 09/10/2019 1137   NITRITE NEGATIVE 09/10/2019 1137   LEUKOCYTESUR TRACE (A) 09/10/2019 1137   LEUKOCYTESUR 3+ 11/09/2014 2357    Radiological Exams on Admission: CT Head Wo Contrast  Result Date: 09/10/2019 CLINICAL DATA:  Fall EXAM: CT HEAD WITHOUT CONTRAST CT CERVICAL SPINE WITHOUT CONTRAST TECHNIQUE: Multidetector CT imaging of the head and cervical spine was performed following the standard protocol without intravenous contrast. Multiplanar CT image reconstructions of the cervical spine were also generated. COMPARISON:  CT head 07/13/2019 FINDINGS: CT HEAD FINDINGS Brain: No evidence of acute infarction, hemorrhage, hydrocephalus, extra-axial collection or mass lesion/mass  effect. Mild cerebral atrophy. Vascular: Negative for hyperdense vessel Skull: Negative for skull fracture Sinuses/Orbits: Negative Other: None CT CERVICAL SPINE FINDINGS Alignment: Normal Skull base and vertebrae: Negative for fracture Soft tissues and spinal canal: Negative Disc levels: Multilevel disc and facet degeneration. Disc degeneration and spurring most prominent at C5-6 with mild spinal stenosis Upper chest: Lung apices clear bilaterally. Other: None IMPRESSION: No acute intracranial abnormality Negative for cervical spine fracture. Electronically Signed   By: Franchot Gallo M.D.   On: 09/10/2019 10:20   CT  Cervical Spine Wo Contrast  Result Date: 09/10/2019 CLINICAL DATA:  Fall EXAM: CT HEAD WITHOUT CONTRAST CT CERVICAL SPINE WITHOUT CONTRAST TECHNIQUE: Multidetector CT imaging of the head and cervical spine was performed following the standard protocol without intravenous contrast. Multiplanar CT image reconstructions of the cervical spine were also generated. COMPARISON:  CT head 07/13/2019 FINDINGS: CT HEAD FINDINGS Brain: No evidence of acute infarction, hemorrhage, hydrocephalus, extra-axial collection or mass lesion/mass effect. Mild cerebral atrophy. Vascular: Negative for hyperdense vessel Skull: Negative for skull fracture Sinuses/Orbits: Negative Other: None CT CERVICAL SPINE FINDINGS Alignment: Normal Skull base and vertebrae: Negative for fracture Soft tissues and spinal canal: Negative Disc levels: Multilevel disc and facet degeneration. Disc degeneration and spurring most prominent at C5-6 with mild spinal stenosis Upper chest: Lung apices clear bilaterally. Other: None IMPRESSION: No acute intracranial abnormality Negative for cervical spine fracture. Electronically Signed   By: Franchot Gallo M.D.   On: 09/10/2019 10:20   DG Hip Unilat With Pelvis 2-3 Views Right  Result Date: 09/10/2019 CLINICAL DATA:  Unwitnessed fall, RIGHT hip bruising. EXAM: DG HIP (WITH OR WITHOUT PELVIS)  2-3V RIGHT COMPARISON:  None. FINDINGS: Osseous alignment is within normal limits. No hip fracture or dislocation is seen. Mild degenerative spurring at the bilateral hip joints. Additional degenerative changes within the lower lumbar spine, at least moderate in degree, incompletely imaged. Soft tissues about the pelvis and RIGHT hip are unremarkable. IMPRESSION: 1. No acute findings. No osseous fracture or dislocation. 2. Mild degenerative change at the bilateral hip joints. Electronically Signed   By: Franki Cabot M.D.   On: 09/10/2019 09:48    EKG: Independently reviewed.  Sinus bradycardia with heart rate in mid 40s.  Q waves in inferolateral leads.No acute change.  Assessment/Plan Active Problems:   * No active hospital problems. *   Symptomatic bradycardia/presyncopal episode.  Patient on multiple medications which can cause dizziness including Lyrica, Seroquel, Lamictal and oxycodone 3 times a day.  She does has bradycardia on EKG which is present on prior EKGs too.  May be vasovagal secondary to dehydration. Patient was on torsemide and having AKI. -Cardiology was consulted from ED-appreciate their help. -Check orthostatic vitals.  Fall.  Patient was complaining of pain in her whole right side.  She was quite tearful.  Currently also having some chronic pains for which she was taking oxycodone 3 times a day. -Continue home dose of oxycodone. -PT/OT evaluation for fall.  AKI with CKD.  Creatinine of 2.36 with baseline around 1. -Give some IV fluids. -Hold torsemide. -Nephrotoxic. -Monitor renal function.  UTI.  UA looks infected. -Pending urine culture. -Patient was started on ceftriaxone in ED.  Hypertension.  Pressure seems elevated.  Managed with torsemide and losartan at home. -Hold torsemide and losartan due to AKI. - hydralazine 25 mg 3 times a day for now.  Diabetes.  No recent A1c in chart. On linagliptin and Metformin at home. -Check A1c. -SSI  Hypothyroidism.   TSH in 2018 was within normal limit. -Check TSH -Continue home dose of Synthroid.  DVT prophylaxis: Lovenox Code Status: Full code Family Communication: No family at bedside. Disposition Plan: Back to facility most likely tomorrow after cardiology evaluation. Consults called: Cardiology Admission status: Observation   Lorella Nimrod MD [facility] Pager [phone]  If 7PM-7AM, please contact night-coverage [email or web address] Password [facility]  09/10/2019, 2:34 PM   This record has been created using Dragon voice recognition software. Errors have been sought and corrected,but may not always be located. Such  creation errors do not reflect on the standard of care.

## 2019-09-10 NOTE — Progress Notes (Signed)
Anticoagulation monitoring(Lovenox):  79 yo female ordered Lovenox 40 mg Q24h  Filed Weights   09/10/19 1654  Weight: 233 lb 3.2 oz (105.8 kg)   BMI    Lab Results  Component Value Date   CREATININE 2.36 (H) 09/10/2019   CREATININE 1.06 (H) 07/13/2019   CREATININE 1.06 (H) 06/02/2019   Estimated Creatinine Clearance: 25.4 mL/min (A) (by C-G formula based on SCr of 2.36 mg/dL (H)). Hemoglobin & Hematocrit     Component Value Date/Time   HGB 13.5 09/10/2019 1334   HGB 13.4 11/11/2014 0325   HCT 43.1 09/10/2019 1334   HCT 42.2 11/11/2014 0325     Per Protocol for Patient with estCrcl < 30 ml/min and BMI < 40, will transition to Lovenox 30 mg Q24h.

## 2019-09-10 NOTE — ED Notes (Signed)
Attempted IV stick x2 without success - pt being transported to CT and Rosalita Chessman will attempt IV when she returns

## 2019-09-10 NOTE — ED Notes (Signed)
IV attempted by Rosalita Chessman without success

## 2019-09-11 DIAGNOSIS — Z20822 Contact with and (suspected) exposure to covid-19: Secondary | ICD-10-CM | POA: Diagnosis present

## 2019-09-11 DIAGNOSIS — R42 Dizziness and giddiness: Secondary | ICD-10-CM | POA: Diagnosis not present

## 2019-09-11 DIAGNOSIS — E890 Postprocedural hypothyroidism: Secondary | ICD-10-CM | POA: Diagnosis present

## 2019-09-11 DIAGNOSIS — I130 Hypertensive heart and chronic kidney disease with heart failure and stage 1 through stage 4 chronic kidney disease, or unspecified chronic kidney disease: Secondary | ICD-10-CM | POA: Diagnosis present

## 2019-09-11 DIAGNOSIS — S50311A Abrasion of right elbow, initial encounter: Secondary | ICD-10-CM | POA: Diagnosis present

## 2019-09-11 DIAGNOSIS — E7800 Pure hypercholesterolemia, unspecified: Secondary | ICD-10-CM | POA: Diagnosis present

## 2019-09-11 DIAGNOSIS — N179 Acute kidney failure, unspecified: Secondary | ICD-10-CM | POA: Diagnosis present

## 2019-09-11 DIAGNOSIS — F0390 Unspecified dementia without behavioral disturbance: Secondary | ICD-10-CM | POA: Diagnosis present

## 2019-09-11 DIAGNOSIS — E875 Hyperkalemia: Secondary | ICD-10-CM | POA: Diagnosis present

## 2019-09-11 DIAGNOSIS — Z7982 Long term (current) use of aspirin: Secondary | ICD-10-CM | POA: Diagnosis not present

## 2019-09-11 DIAGNOSIS — N390 Urinary tract infection, site not specified: Secondary | ICD-10-CM | POA: Diagnosis present

## 2019-09-11 DIAGNOSIS — N1832 Chronic kidney disease, stage 3b: Secondary | ICD-10-CM | POA: Diagnosis present

## 2019-09-11 DIAGNOSIS — Z853 Personal history of malignant neoplasm of breast: Secondary | ICD-10-CM | POA: Diagnosis not present

## 2019-09-11 DIAGNOSIS — Y92099 Unspecified place in other non-institutional residence as the place of occurrence of the external cause: Secondary | ICD-10-CM | POA: Diagnosis not present

## 2019-09-11 DIAGNOSIS — T402X5A Adverse effect of other opioids, initial encounter: Secondary | ICD-10-CM | POA: Diagnosis present

## 2019-09-11 DIAGNOSIS — E1122 Type 2 diabetes mellitus with diabetic chronic kidney disease: Secondary | ICD-10-CM | POA: Diagnosis present

## 2019-09-11 DIAGNOSIS — R001 Bradycardia, unspecified: Secondary | ICD-10-CM | POA: Diagnosis present

## 2019-09-11 DIAGNOSIS — T426X5A Adverse effect of other antiepileptic and sedative-hypnotic drugs, initial encounter: Secondary | ICD-10-CM | POA: Diagnosis present

## 2019-09-11 DIAGNOSIS — F329 Major depressive disorder, single episode, unspecified: Secondary | ICD-10-CM | POA: Diagnosis present

## 2019-09-11 DIAGNOSIS — M810 Age-related osteoporosis without current pathological fracture: Secondary | ICD-10-CM | POA: Diagnosis present

## 2019-09-11 DIAGNOSIS — G8929 Other chronic pain: Secondary | ICD-10-CM | POA: Diagnosis present

## 2019-09-11 DIAGNOSIS — Z803 Family history of malignant neoplasm of breast: Secondary | ICD-10-CM | POA: Diagnosis not present

## 2019-09-11 DIAGNOSIS — I5032 Chronic diastolic (congestive) heart failure: Secondary | ICD-10-CM | POA: Diagnosis present

## 2019-09-11 DIAGNOSIS — W19XXXA Unspecified fall, initial encounter: Secondary | ICD-10-CM | POA: Diagnosis present

## 2019-09-11 DIAGNOSIS — E785 Hyperlipidemia, unspecified: Secondary | ICD-10-CM | POA: Diagnosis present

## 2019-09-11 DIAGNOSIS — R55 Syncope and collapse: Secondary | ICD-10-CM | POA: Diagnosis present

## 2019-09-11 DIAGNOSIS — T43595A Adverse effect of other antipsychotics and neuroleptics, initial encounter: Secondary | ICD-10-CM | POA: Diagnosis present

## 2019-09-11 LAB — COMPREHENSIVE METABOLIC PANEL
ALT: 20 U/L (ref 0–44)
AST: 30 U/L (ref 15–41)
Albumin: 3.5 g/dL (ref 3.5–5.0)
Alkaline Phosphatase: 97 U/L (ref 38–126)
Anion gap: 8 (ref 5–15)
BUN: 35 mg/dL — ABNORMAL HIGH (ref 8–23)
CO2: 24 mmol/L (ref 22–32)
Calcium: 9 mg/dL (ref 8.9–10.3)
Chloride: 107 mmol/L (ref 98–111)
Creatinine, Ser: 1.94 mg/dL — ABNORMAL HIGH (ref 0.44–1.00)
GFR calc Af Amer: 28 mL/min — ABNORMAL LOW (ref >60)
GFR calc non Af Amer: 25 mL/min — ABNORMAL LOW (ref >60)
Glucose, Bld: 166 mg/dL — ABNORMAL HIGH (ref 70–99)
Potassium: 5.2 mmol/L — ABNORMAL HIGH (ref 3.5–5.1)
Sodium: 139 mmol/L (ref 135–145)
Total Bilirubin: 0.7 mg/dL (ref 0.3–1.2)
Total Protein: 6.9 g/dL (ref 6.5–8.1)

## 2019-09-11 LAB — SARS CORONAVIRUS 2 (TAT 6-24 HRS): SARS Coronavirus 2: NEGATIVE

## 2019-09-11 LAB — GLUCOSE, CAPILLARY
Glucose-Capillary: 125 mg/dL — ABNORMAL HIGH (ref 70–99)
Glucose-Capillary: 96 mg/dL (ref 70–99)
Glucose-Capillary: 120 mg/dL — ABNORMAL HIGH (ref 70–99)
Glucose-Capillary: 117 mg/dL — ABNORMAL HIGH (ref 70–99)

## 2019-09-11 MED ORDER — ENOXAPARIN SODIUM 40 MG/0.4ML ~~LOC~~ SOLN
40.0000 mg | SUBCUTANEOUS | Status: DC
  Administered 2019-09-11: 40 mg via SUBCUTANEOUS
  Filled 2019-09-11: qty 0.4

## 2019-09-11 MED ORDER — LEVOTHYROXINE SODIUM 50 MCG PO TABS
175.0000 ug | ORAL_TABLET | Freq: Every day | ORAL | Status: DC
  Administered 2019-09-12: 175 ug via ORAL
  Filled 2019-09-11: qty 1

## 2019-09-11 MED ORDER — SODIUM POLYSTYRENE SULFONATE 15 GM/60ML PO SUSP
30.0000 g | Freq: Once | ORAL | Status: AC
  Administered 2019-09-11: 30 g via ORAL
  Filled 2019-09-11: qty 120

## 2019-09-11 NOTE — Progress Notes (Signed)
PROGRESS NOTE    Melanie Young  [id number] DOB: 1940-11-22 DOA: 09/10/2019 PCP: System, Pcp Not In   Brief Narrative:  Melanie Young is a 79 y.o. female with medical history significant of breast cancer, dementia, depression, diabetes, hyperlipidemia, hypertension and CKD came to ED after having a unwitnessed fall at her facility. Patient felt a little dizzy before falling, hurt her right side and complaining of right hip pain.  According to patient she has 2 falls in the prior week with similar symptoms. No radiologic evidence of any fracture.  Found to have bradycardic and admitted for presyncopal most likely secondary to bradycardia and polypharmacy.  Subjective: Patient was feeling little better when seen this morning.  She continued to have right-sided body pain.  She does not want to go back to that facility and want [place] to look for something different.  Assessment & Plan:   Active Problems:   AKI (acute kidney injury) ([place])   Symptomatic sinus bradycardia  Symptomatic bradycardia/presyncopal episode.  Patient on multiple medications which can cause dizziness including Lyrica, Seroquel, Lamictal and oxycodone 3 times a day.  She does has bradycardia on EKG which is present on prior EKGs too.  May be vasovagal secondary to dehydration. Patient was on torsemide and having AKI. -Cardiology was consulted from ED-appreciate their help, they were recommending echocardiogram which was ordered. - orthostatic vitals are negative for orthostasis.  Fall.  Patient was complaining of pain in her whole right side. -Continue home dose of oxycodone. -PT/OT evaluation for fall-recommending SNF placement.  Patient is already from a facility and does not want to go back to the same place. -Social worker consult to look for placement and her options.  Hyperkalemia.  She had mild hyperkalemia with potassium of 5.2 this morning. -One-time dose of Kayexalate. -Keep monitoring  AKI with CKD.   Creatinine of 2.36 on admission with baseline around 1. Proved to 1.94 today with gentle hydration. -Keep holding torsemide. -Avoid nephrotoxic. -Monitor renal function.  UTI.  UA looks infected. -Pending urine culture. -Continue with ceftriaxone-we will de-escalate antibiotics according to sensitivity results.  Hypertension.    Pressure within goal today. Being managed with torsemide and losartan at home. -Holding torsemide and losartan due to AKI. - hydralazine 25 mg 3 times a day for now.  Diabetes.    Controlled with A1c of 6.3. On linagliptin and Metformin at home. -SSI  Hypothyroidism.  TSH in 2018 was within normal limit. - TSH repeated yesterday were elevated at 19.201. -Increase Synthroid to 175 MCG.  Objective: Vitals:   09/10/19 1654 09/10/19 2042 09/11/19 0419 09/11/19 0729  BP:  111/61 135/61 117/68  Pulse:  (!) 48 (!) 47 (!) 44  Resp:  18 16 16   Temp:  (!) 97.5 F (36.4 C) 97.6 F (36.4 C) 97.8 F (36.6 C)  TempSrc:  Oral Oral Oral  SpO2:  98% 99% 100%  Weight: 105.8 kg     Height: 5\' 7"  (1.702 m)       Intake/Output Summary (Last 24 hours) at 09/11/2019 1348 Last data filed at 09/11/2019 0900 Gross per 24 hour  Intake 700 ml  Output 0 ml  Net 700 ml   Filed Weights   09/10/19 1654  Weight: 105.8 kg    Examination:  General exam: Appears calm and comfortable  Respiratory system: Clear to auscultation. Respiratory effort normal. Cardiovascular system: Sinus bradycardia. No JVD, murmurs, rubs, gallops or clicks. Gastrointestinal system: Soft, nontender, nondistended, bowel sounds positive. Central nervous system: Alert and  oriented. No focal neurological deficits. Extremities: No edema, no cyanosis, pulses intact and symmetrical. Psychiatry: Judgement and insight appear normal. Mood & affect appropriate.    DVT prophylaxis: Lovenox Code Status: Full Family Communication: Family at bedside Disposition Plan: Pending improvement and SNF  placement.  Patient does not want to go back to the current facility.  Consultants:   Cardiology  Procedures:  Antimicrobials:  Ceftriaxone  Data Reviewed: I have personally reviewed following labs and imaging studies  CBC: Recent Labs  Lab 09/10/19 1334  WBC 5.3  NEUTROABS 2.0  HGB 13.5  HCT 43.1  MCV 85.2  PLT [id number]   Basic Metabolic Panel: Recent Labs  Lab 09/10/19 1334 09/11/19 0506  NA 140 139  K 4.6 5.2*  CL 102 107  CO2 27 24  GLUCOSE 97 166*  BUN 35* 35*  CREATININE 2.36* 1.94*  CALCIUM 9.4 9.0   GFR: Estimated Creatinine Clearance: 30.9 mL/min (A) (by C-G formula based on SCr of 1.94 mg/dL (H)). Liver Function Tests: Recent Labs  Lab 09/11/19 0506  AST 30  ALT 20  ALKPHOS 97  BILITOT 0.7  PROT 6.9  ALBUMIN 3.5   No results for input(s): LIPASE, AMYLASE in the last 168 hours. No results for input(s): AMMONIA in the last 168 hours. Coagulation Profile: No results for input(s): INR, PROTIME in the last 168 hours. Cardiac Enzymes: No results for input(s): CKTOTAL, CKMB, CKMBINDEX, TROPONINI in the last 168 hours. BNP (last 3 results) No results for input(s): PROBNP in the last 8760 hours. HbA1C: Recent Labs    09/10/19 1333  HGBA1C 6.3*   CBG: Recent Labs  Lab 09/10/19 1709 09/10/19 2111 09/11/19 0728 09/11/19 1121  GLUCAP 123* 147* 125* 96   Lipid Profile: No results for input(s): CHOL, HDL, LDLCALC, TRIG, CHOLHDL, LDLDIRECT in the last 72 hours. Thyroid Function Tests: Recent Labs    09/10/19 1333  TSH 19.201*   Anemia Panel: No results for input(s): VITAMINB12, FOLATE, FERRITIN, TIBC, IRON, RETICCTPCT in the last 72 hours. Sepsis Labs: No results for input(s): PROCALCITON, LATICACIDVEN in the last 168 hours.  Recent Results (from the past 240 hour(s))  SARS CORONAVIRUS 2 (TAT 6-24 HRS) Nasopharyngeal Nasopharyngeal Swab     Status: None   Collection Time: 09/10/19  3:54 PM   Specimen: Nasopharyngeal Swab  Result Value Ref  Range Status   SARS Coronavirus 2 NEGATIVE NEGATIVE Final    Comment: (NOTE) SARS-CoV-2 target nucleic acids are NOT DETECTED. The SARS-CoV-2 RNA is generally detectable in upper and lower respiratory specimens during the acute phase of infection. Negative results do not preclude SARS-CoV-2 infection, do not rule out co-infections with other pathogens, and should not be used as the sole basis for treatment or other patient management decisions. Negative results must be combined with clinical observations, patient history, and epidemiological information. The expected result is Negative. Fact Sheet for Patients: SugarRoll.be Fact Sheet for Healthcare Providers: https://www.woods-mathews.com/ This test is not yet approved or cleared by the [place] FDA and  has been authorized for detection and/or diagnosis of SARS-CoV-2 by FDA under an Emergency Use Authorization (EUA). This EUA will remain  in effect (meaning this test can be used) for the duration of the COVID-19 declaration under Section 56 4(b)(1) of the Act, 21 U.S.C. section 360bbb-3(b)(1), unless the authorization is terminated or revoked sooner. Performed at [facility], [place] [address], [place], [place] [address]      Radiology Studies: CT Head Wo Contrast  Result Date: 09/10/2019  CLINICAL DATA:  Fall EXAM: CT HEAD WITHOUT CONTRAST CT CERVICAL SPINE WITHOUT CONTRAST TECHNIQUE: Multidetector CT imaging of the head and cervical spine was performed following the standard protocol without intravenous contrast. Multiplanar CT image reconstructions of the cervical spine were also generated. COMPARISON:  CT head 07/13/2019 FINDINGS: CT HEAD FINDINGS Brain: No evidence of acute infarction, hemorrhage, hydrocephalus, extra-axial collection or mass lesion/mass effect. Mild cerebral atrophy. Vascular: Negative for hyperdense vessel Skull: Negative for skull fracture Sinuses/Orbits:  Negative Other: None CT CERVICAL SPINE FINDINGS Alignment: Normal Skull base and vertebrae: Negative for fracture Soft tissues and spinal canal: Negative Disc levels: Multilevel disc and facet degeneration. Disc degeneration and spurring most prominent at C5-6 with mild spinal stenosis Upper chest: Lung apices clear bilaterally. Other: None IMPRESSION: No acute intracranial abnormality Negative for cervical spine fracture. Electronically Signed   By: Franchot Gallo M.D.   On: 09/10/2019 10:20   CT Cervical Spine Wo Contrast  Result Date: 09/10/2019 CLINICAL DATA:  Fall EXAM: CT HEAD WITHOUT CONTRAST CT CERVICAL SPINE WITHOUT CONTRAST TECHNIQUE: Multidetector CT imaging of the head and cervical spine was performed following the standard protocol without intravenous contrast. Multiplanar CT image reconstructions of the cervical spine were also generated. COMPARISON:  CT head 07/13/2019 FINDINGS: CT HEAD FINDINGS Brain: No evidence of acute infarction, hemorrhage, hydrocephalus, extra-axial collection or mass lesion/mass effect. Mild cerebral atrophy. Vascular: Negative for hyperdense vessel Skull: Negative for skull fracture Sinuses/Orbits: Negative Other: None CT CERVICAL SPINE FINDINGS Alignment: Normal Skull base and vertebrae: Negative for fracture Soft tissues and spinal canal: Negative Disc levels: Multilevel disc and facet degeneration. Disc degeneration and spurring most prominent at C5-6 with mild spinal stenosis Upper chest: Lung apices clear bilaterally. Other: None IMPRESSION: No acute intracranial abnormality Negative for cervical spine fracture. Electronically Signed   By: Franchot Gallo M.D.   On: 09/10/2019 10:20   DG Hip Unilat With Pelvis 2-3 Views Right  Result Date: 09/10/2019 CLINICAL DATA:  Unwitnessed fall, RIGHT hip bruising. EXAM: DG HIP (WITH OR WITHOUT PELVIS) 2-3V RIGHT COMPARISON:  None. FINDINGS: Osseous alignment is within normal limits. No hip fracture or dislocation is seen.  Mild degenerative spurring at the bilateral hip joints. Additional degenerative changes within the lower lumbar spine, at least moderate in degree, incompletely imaged. Soft tissues about the pelvis and RIGHT hip are unremarkable. IMPRESSION: 1. No acute findings. No osseous fracture or dislocation. 2. Mild degenerative change at the bilateral hip joints. Electronically Signed   By: Franki Cabot M.D.   On: 09/10/2019 09:48    Scheduled Meds: . aspirin  325 mg Oral Daily  . atorvastatin  40 mg Oral QHS  . busPIRone  10 mg Oral BID  . enoxaparin (LOVENOX) injection  40 mg Subcutaneous Q24H  . hydrALAZINE  25 mg Oral Q8H  . insulin aspart  0-5 Units Subcutaneous QHS  . insulin aspart  0-9 Units Subcutaneous TID WC  . lamoTRIgine  200 mg Oral Daily  . levothyroxine  150 mcg Oral Daily  . oxybutynin  2.5 mg Oral BID  . oxyCODONE  10 mg Oral TID  . pantoprazole  40 mg Oral Daily  . pregabalin  100 mg Oral BID  . QUEtiapine  50 mg Oral QHS  . sodium chloride flush  3 mL Intravenous Q12H   Continuous Infusions: . cefTRIAXone (ROCEPHIN)  IV       LOS: 0 days   Time spent: 35 minutes  Lorella Nimrod, MD [facility] Pager [phone]  If  7PM-7AM, please contact night-coverage [email or web address] Password [facility] 09/11/2019, 1:48 PM   This record has been created using Systems analyst. Errors have been sought and corrected,but may not always be located. Such creation errors do not reflect on the standard of care.

## 2019-09-11 NOTE — Progress Notes (Signed)
Potassium 5.2, Dr amin,S aware. N.O for kayexalate. Pt heart rate continues to be  Sinus brady heart rate in the 40's, complains of dizziness. Dr Reesa Chew aware.

## 2019-09-11 NOTE — Progress Notes (Signed)
PHARMACIST - PHYSICIAN COMMUNICATION  CONCERNING:  Enoxaparin (Lovenox) for DVT Prophylaxis    RECOMMENDATION: Patient was prescribed enoxaprin 30mg  q24 hours for VTE prophylaxis for CrCl <45ml/min  Filed Weights   09/10/19 1654  Weight: 233 lb 3.2 oz (105.8 kg)    Body mass index is 36.52 kg/m.  Estimated Creatinine Clearance: 30.9 mL/min (A) (by C-G formula based on SCr of 1.94 mg/dL (H)).   CrCl have improved. Based on [place] patient is candidate for enoxaparin 40mg  every 24 hours based on CrCl >7ml/min and Weight >45kg  DESCRIPTION: Pharmacy has adjusted enoxaparin dose per [facility] policy.  Patient is now receiving enoxaparin 40mg  every 24hours.   Pernell Dupre, PharmD, BCPS Clinical Pharmacist 09/11/2019 12:59 PM

## 2019-09-11 NOTE — Progress Notes (Signed)
[facility] Cardiology    SUBJECTIVE: Multiple complaints vertigo disease shortness of breath generalized body aches and pain palpitations states she just does not feel good   Vitals:   09/11/19 0729 09/11/19 1500 09/11/19 1609 09/11/19 1948  BP: 117/68 (!) 144/82 124/78 (!) 152/72  Pulse: (!) 44 (!) 50 (!) 46 (!) 50  Resp: 16  16 20   Temp: 97.8 F (36.6 C)  98.4 F (36.9 C) 98 F (36.7 C)  TempSrc: Oral  Oral Oral  SpO2: 100% 100% 100% 99%  Weight:      Height:         Intake/Output Summary (Last 24 hours) at 09/11/2019 2248 Last data filed at 09/11/2019 1948 Gross per 24 hour  Intake 50 ml  Output 100 ml  Net -50 ml      PHYSICAL EXAM  General: Well developed, well nourished, in no acute distress HEENT:  Normocephalic and atramatic Neck:  No JVD.  Lungs: Clear bilaterally to auscultation and percussion. Heart: Bradycardic. Normal S1 and S2 without gallops or murmurs.  Abdomen: Bowel sounds are positive, abdomen soft and non-tender  Msk:  Back normal, normal gait. Normal strength and tone for age. Extremities: No clubbing, cyanosis or edema.   Neuro: Alert and oriented X 3. Psych:  Good affect, responds appropriately   LABS: Basic Metabolic Panel: Recent Labs    09/10/19 1334 09/11/19 0506  NA 140 139  K 4.6 5.2*  CL 102 107  CO2 27 24  GLUCOSE 97 166*  BUN 35* 35*  CREATININE 2.36* 1.94*  CALCIUM 9.4 9.0   Liver Function Tests: Recent Labs    09/11/19 0506  AST 30  ALT 20  ALKPHOS 97  BILITOT 0.7  PROT 6.9  ALBUMIN 3.5   No results for input(s): LIPASE, AMYLASE in the last 72 hours. CBC: Recent Labs    09/10/19 1334  WBC 5.3  NEUTROABS 2.0  HGB 13.5  HCT 43.1  MCV 85.2  PLT 244   Cardiac Enzymes: No results for input(s): CKTOTAL, CKMB, CKMBINDEX, TROPONINI in the last 72 hours. BNP: Invalid input(s): POCBNP D-Dimer: No results for input(s): DDIMER in the last 72 hours. Hemoglobin A1C: Recent Labs    09/10/19 1333  HGBA1C 6.3*    Fasting Lipid Panel: No results for input(s): CHOL, HDL, LDLCALC, TRIG, CHOLHDL, LDLDIRECT in the last 72 hours. Thyroid Function Tests: Recent Labs    09/10/19 1333  TSH 19.201*   Anemia Panel: No results for input(s): VITAMINB12, FOLATE, FERRITIN, TIBC, IRON, RETICCTPCT in the last 72 hours.  CT Head Wo Contrast  Result Date: 09/10/2019 CLINICAL DATA:  Fall EXAM: CT HEAD WITHOUT CONTRAST CT CERVICAL SPINE WITHOUT CONTRAST TECHNIQUE: Multidetector CT imaging of the head and cervical spine was performed following the standard protocol without intravenous contrast. Multiplanar CT image reconstructions of the cervical spine were also generated. COMPARISON:  CT head 07/13/2019 FINDINGS: CT HEAD FINDINGS Brain: No evidence of acute infarction, hemorrhage, hydrocephalus, extra-axial collection or mass lesion/mass effect. Mild cerebral atrophy. Vascular: Negative for hyperdense vessel Skull: Negative for skull fracture Sinuses/Orbits: Negative Other: None CT CERVICAL SPINE FINDINGS Alignment: Normal Skull base and vertebrae: Negative for fracture Soft tissues and spinal canal: Negative Disc levels: Multilevel disc and facet degeneration. Disc degeneration and spurring most prominent at C5-6 with mild spinal stenosis Upper chest: Lung apices clear bilaterally. Other: None IMPRESSION: No acute intracranial abnormality Negative for cervical spine fracture. Electronically Signed   By: Franchot Gallo M.D.   On: 09/10/2019 10:20  CT Cervical Spine Wo Contrast  Result Date: 09/10/2019 CLINICAL DATA:  Fall EXAM: CT HEAD WITHOUT CONTRAST CT CERVICAL SPINE WITHOUT CONTRAST TECHNIQUE: Multidetector CT imaging of the head and cervical spine was performed following the standard protocol without intravenous contrast. Multiplanar CT image reconstructions of the cervical spine were also generated. COMPARISON:  CT head 07/13/2019 FINDINGS: CT HEAD FINDINGS Brain: No evidence of acute infarction, hemorrhage,  hydrocephalus, extra-axial collection or mass lesion/mass effect. Mild cerebral atrophy. Vascular: Negative for hyperdense vessel Skull: Negative for skull fracture Sinuses/Orbits: Negative Other: None CT CERVICAL SPINE FINDINGS Alignment: Normal Skull base and vertebrae: Negative for fracture Soft tissues and spinal canal: Negative Disc levels: Multilevel disc and facet degeneration. Disc degeneration and spurring most prominent at C5-6 with mild spinal stenosis Upper chest: Lung apices clear bilaterally. Other: None IMPRESSION: No acute intracranial abnormality Negative for cervical spine fracture. Electronically Signed   By: Franchot Gallo M.D.   On: 09/10/2019 10:20   DG Hip Unilat With Pelvis 2-3 Views Right  Result Date: 09/10/2019 CLINICAL DATA:  Unwitnessed fall, RIGHT hip bruising. EXAM: DG HIP (WITH OR WITHOUT PELVIS) 2-3V RIGHT COMPARISON:  None. FINDINGS: Osseous alignment is within normal limits. No hip fracture or dislocation is seen. Mild degenerative spurring at the bilateral hip joints. Additional degenerative changes within the lower lumbar spine, at least moderate in degree, incompletely imaged. Soft tissues about the pelvis and RIGHT hip are unremarkable. IMPRESSION: 1. No acute findings. No osseous fracture or dislocation. 2. Mild degenerative change at the bilateral hip joints. Electronically Signed   By: Franki Cabot M.D.   On: 09/10/2019 09:48     Echo pending  TELEMETRY: Bradycardia rates in the 40s probably sinus cannot rule out junctional  ASSESSMENT AND PLAN:  Active Problems:   AKI (acute kidney injury) ([place])   Sinus bradycardia Weakness Shortness of breath Heart failure Vertigo . Plan Continue current therapy Follow-up EKGs troponins Continue telemetry for bradycardia No clear indication for permanent pacemaker at this point Consider long-term anticoagulation Continue dialysis therapy as per nephrology Consider meclizine for vertigo in the air May need  referral to ENT Now here for continued therapy but states does not feel really well     Yolonda Kida, MD, [facility] 09/11/2019 10:48 PM

## 2019-09-11 NOTE — Consult Note (Signed)
CARDIOLOGY CONSULT NOTE               Patient ID: Melanie Young MRN: [id number] DOB/AGE: [birth year] 77 y.o.  Admit date: 09/10/2019 Referring Physician Lorella Nimrod MD Primary Physician Juluis Pitch MD Primary Cardiologist Dr. [name] Reason for Consultation bradycardia presyncope unwitnessed fall  HPI: History of recent episode of unwitnessed fall at the facility thought to be possibly from syncope she has history of end-stage renal disease patient diabetic she has had 2 falls in the prior week found to be somewhat bradycardic patient has had worsening renal function recently has been seen by cardiology recently but at the time had no symptoms was treated conservatively patient is not on any rate blocking drugs has history of hypertension on inhalers but also on Seroquel and Demadex now here for further assessment possible syncope with bradycardia.  History limited from the patient because of dementia  Review of systems complete and found to be negative unless listed above     Past Medical History:  Diagnosis Date  . Breast cancer ([place]) 1980's   right breast ca with mastectomy  . Chronic kidney disease   . Dementia ([place])   . Depression   . Diabetes mellitus without complication ([place])   . Hypercholesteremia   . Hypertension   . Hypothyroidism   . Osteoporosis     Past Surgical History:  Procedure Laterality Date  . BREAST BIOPSY Left 2013   core needle bx, benign  . BREAST SURGERY    . CESAREAN SECTION    . COLONOSCOPY WITH PROPOFOL N/A 08/31/2018   Procedure: COLONOSCOPY WITH PROPOFOL;  Surgeon: [place], Benay Pike, MD;  Location: [facility] ENDOSCOPY;  Service: Endoscopy;  Laterality: N/A;  . MASTECTOMY Right 1973   breast ca  . THYROIDECTOMY      Medications Prior to Admission  Medication Sig Dispense Refill Last Dose  . acetaminophen (TYLENOL) 325 MG tablet Take 650 mg by mouth every 4 (four) hours as needed for mild pain.   30+ days at PRN  . acetaminophen (TYLENOL)  500 MG tablet Take 500 mg by mouth every 6 (six) hours as needed for mild pain or fever.   10+ days at PRN  . alum & mag hydroxide-simeth (MAALOX/MYLANTA) 200-200-20 MG/5ML suspension Take 30 mLs by mouth every 6 (six) hours as needed for indigestion or heartburn.   21+ days at PRN  . aspirin 325 MG EC tablet Take 325 mg by mouth daily.   09/10/2019 at 0800  . atorvastatin (LIPITOR) 40 MG tablet Take 40 mg by mouth at bedtime.    09/09/2019 at [place]  . busPIRone (BUSPAR) 10 MG tablet Take 10 mg by mouth 2 (two) times daily.    09/10/2019 at 0800  . calcium carbonate (TUMS - DOSED IN MG ELEMENTAL CALCIUM) 500 MG chewable tablet Chew 1 tablet by mouth 2 (two) times daily.   09/10/2019 at 0800  . cholecalciferol (VITAMIN D3) 25 MCG (1000 UNIT) tablet Take 1,000 Units by mouth daily.   09/10/2019 at 0800  . guaifenesin (ROBITUSSIN) 100 MG/5ML syrup Take 200 mg by mouth every 6 (six) hours as needed for cough.   Unknown at PRN  . lamoTRIgine (LAMICTAL) 200 MG tablet Take 200 mg by mouth daily.    09/10/2019 at 0800  . levothyroxine (SYNTHROID, LEVOTHROID) 150 MCG tablet Take 150 mcg by mouth daily.   09/10/2019 at 0700  . linagliptin (TRADJENTA) 5 MG TABS tablet Take 5 mg by mouth daily.   09/10/2019 at 0800  .  loperamide (IMODIUM) 2 MG capsule Take 2 mg by mouth as needed for diarrhea or loose stools ((max 8 doses in 24 hours)).   Unknown at PRN  . LORazepam (ATIVAN) 0.5 MG tablet Take 0.5 mg by mouth 2 (two) times daily as needed for anxiety (agitation).   5+ days at PRN  . losartan (COZAAR) 50 MG tablet Take 50 mg by mouth daily.   09/10/2019 at 0800  . LYRICA 100 MG capsule Take 100 mg by mouth 2 (two) times daily.   09/10/2019 at 0800  . magnesium hydroxide (MILK OF MAGNESIA) 400 MG/5ML suspension Take 30 mLs by mouth at bedtime as needed for mild constipation or moderate constipation.   Unknown at PRN  . metFORMIN (GLUCOPHAGE) 500 MG tablet Take 750 mg by mouth daily.    09/10/2019 at 0800  .  neomycin-bacitracin-polymyxin (NEOSPORIN) ointment Apply 1 application topically as needed for wound care.   Unknown at PRN  . omeprazole (PRILOSEC) 20 MG capsule Take 20 mg by mouth daily.    09/10/2019 at 0700  . ondansetron (ZOFRAN) 4 MG tablet Take 4 mg by mouth every 6 (six) hours as needed for nausea or vomiting.   21+ days at PRN  . oxybutynin (DITROPAN) 5 MG tablet Take 2.5 mg by mouth 2 (two) times daily.    09/10/2019 at 0800  . Oxycodone HCl 10 MG TABS Take 10 mg by mouth 3 (three) times daily.    09/10/2019 at 0800  . PROAIR HFA 108 (90 Base) MCG/ACT inhaler Inhale 1 puff into the lungs every 6 (six) hours as needed for wheezing or shortness of breath.    Unknown at PRN  . QUEtiapine (SEROQUEL) 50 MG tablet Take 50 mg by mouth at bedtime.    09/09/2019 at [place]  . QUEtiapine (SEROQUEL) 50 MG tablet Take 25 mg by mouth daily.   09/10/2019 at 0800  . torsemide (DEMADEX) 20 MG tablet Take 20 mg by mouth daily.   09/10/2019 at 0800  . trolamine salicylate (ASPERCREME) 10 % cream Apply 1 application topically 3 (three) times daily as needed for muscle pain.    Unknown at PRN   Social History   Socioeconomic History  . Marital status: Single    Spouse name: Not on file  . Number of children: Not on file  . Years of education: Not on file  . Highest education level: Not on file  Occupational History  . Not on file  Tobacco Use  . Smoking status: Never Smoker  . Smokeless tobacco: Never Used  Substance and Sexual Activity  . Alcohol use: Not Currently  . Drug use: Never  . Sexual activity: Not on file  Other Topics Concern  . Not on file  Social History Narrative  . Not on file   Social Determinants of Health   Financial Resource Strain:   . Difficulty of Paying Living Expenses: Not on file  Food Insecurity:   . Worried About Charity fundraiser in the Last Year: Not on file  . Ran Out of Food in the Last Year: Not on file  Transportation Needs:   . Lack of Transportation  (Medical): Not on file  . Lack of Transportation (Non-Medical): Not on file  Physical Activity:   . Days of Exercise per Week: Not on file  . Minutes of Exercise per Session: Not on file  Stress:   . Feeling of Stress : Not on file  Social Connections:   . Frequency of Communication  with Friends and Family: Not on file  . Frequency of Social Gatherings with Friends and Family: Not on file  . Attends Religious Services: Not on file  . Active Member of Clubs or Organizations: Not on file  . Attends Archivist Meetings: Not on file  . Marital Status: Not on file  Intimate Partner Violence:   . Fear of Current or Ex-Partner: Not on file  . Emotionally Abused: Not on file  . Physically Abused: Not on file  . Sexually Abused: Not on file    Family History  Problem Relation Age of Onset  . Breast cancer Maternal Aunt   . Breast cancer Cousin        3 maternal cousins, 55's      Review of systems complete and found to be negative unless listed above      PHYSICAL EXAM  General: Well developed, well nourished, in no acute distress HEENT:  Normocephalic and atramatic Neck:  No JVD.  Lungs: Clear bilaterally to auscultation and percussion. Heart: Bradycardic. Normal S1 and S2 without gallops or murmurs.  Abdomen: Bowel sounds are positive, abdomen soft and non-tender  Msk:  Back normal, normal gait. Normal strength and tone for age. Extremities: No clubbing, cyanosis or edema.   Neuro: Alert and oriented X 3. Psych:  Good affect, responds appropriately  Labs:   Lab Results  Component Value Date   WBC 5.3 09/10/2019   HGB 13.5 09/10/2019   HCT 43.1 09/10/2019   MCV 85.2 09/10/2019   PLT 244 09/10/2019    Recent Labs  Lab 09/11/19 0506  NA 139  K 5.2*  CL 107  CO2 24  BUN 35*  CREATININE 1.94*  CALCIUM 9.0  PROT 6.9  BILITOT 0.7  ALKPHOS 97  ALT 20  AST 30  GLUCOSE 166*   Lab Results  Component Value Date   TROPONINI <0.03 08/10/2017   No  results found for: CHOL No results found for: HDL No results found for: LDLCALC No results found for: TRIG No results found for: CHOLHDL No results found for: LDLDIRECT    Radiology: CT Head Wo Contrast  Result Date: 09/10/2019 CLINICAL DATA:  Fall EXAM: CT HEAD WITHOUT CONTRAST CT CERVICAL SPINE WITHOUT CONTRAST TECHNIQUE: Multidetector CT imaging of the head and cervical spine was performed following the standard protocol without intravenous contrast. Multiplanar CT image reconstructions of the cervical spine were also generated. COMPARISON:  CT head 07/13/2019 FINDINGS: CT HEAD FINDINGS Brain: No evidence of acute infarction, hemorrhage, hydrocephalus, extra-axial collection or mass lesion/mass effect. Mild cerebral atrophy. Vascular: Negative for hyperdense vessel Skull: Negative for skull fracture Sinuses/Orbits: Negative Other: None CT CERVICAL SPINE FINDINGS Alignment: Normal Skull base and vertebrae: Negative for fracture Soft tissues and spinal canal: Negative Disc levels: Multilevel disc and facet degeneration. Disc degeneration and spurring most prominent at C5-6 with mild spinal stenosis Upper chest: Lung apices clear bilaterally. Other: None IMPRESSION: No acute intracranial abnormality Negative for cervical spine fracture. Electronically Signed   By: Franchot Gallo M.D.   On: 09/10/2019 10:20   CT Cervical Spine Wo Contrast  Result Date: 09/10/2019 CLINICAL DATA:  Fall EXAM: CT HEAD WITHOUT CONTRAST CT CERVICAL SPINE WITHOUT CONTRAST TECHNIQUE: Multidetector CT imaging of the head and cervical spine was performed following the standard protocol without intravenous contrast. Multiplanar CT image reconstructions of the cervical spine were also generated. COMPARISON:  CT head 07/13/2019 FINDINGS: CT HEAD FINDINGS Brain: No evidence of acute infarction, hemorrhage, hydrocephalus, extra-axial collection or  mass lesion/mass effect. Mild cerebral atrophy. Vascular: Negative for hyperdense vessel  Skull: Negative for skull fracture Sinuses/Orbits: Negative Other: None CT CERVICAL SPINE FINDINGS Alignment: Normal Skull base and vertebrae: Negative for fracture Soft tissues and spinal canal: Negative Disc levels: Multilevel disc and facet degeneration. Disc degeneration and spurring most prominent at C5-6 with mild spinal stenosis Upper chest: Lung apices clear bilaterally. Other: None IMPRESSION: No acute intracranial abnormality Negative for cervical spine fracture. Electronically Signed   By: Franchot Gallo M.D.   On: 09/10/2019 10:20   DG Hip Unilat With Pelvis 2-3 Views Right  Result Date: 09/10/2019 CLINICAL DATA:  Unwitnessed fall, RIGHT hip bruising. EXAM: DG HIP (WITH OR WITHOUT PELVIS) 2-3V RIGHT COMPARISON:  None. FINDINGS: Osseous alignment is within normal limits. No hip fracture or dislocation is seen. Mild degenerative spurring at the bilateral hip joints. Additional degenerative changes within the lower lumbar spine, at least moderate in degree, incompletely imaged. Soft tissues about the pelvis and RIGHT hip are unremarkable. IMPRESSION: 1. No acute findings. No osseous fracture or dislocation. 2. Mild degenerative change at the bilateral hip joints. Electronically Signed   By: Franki Cabot M.D.   On: 09/10/2019 09:48    EKG: Sinus bradycardia mild interventricular conduction delay  ASSESSMENT AND PLAN:  Fall Possible syncope Bradycardia End-stage renal disease Diabetes Vertigo Dementia Hypertension Hyperlipidemia . Plan Agree with current therapy symptomatic bradycardia possibly presyncopal I am not convinced permanent pacemaker is indicated at this point Echocardiogram will be helpful for further assessment Continue Lasix therapy for heart failure Chronic renal insufficiency as per nephrology Recommend orthostatic blood pressures Recommend antibiotic therapy for possible UTI Continue hypertension management currently on losartan will probably hold torsemide for now  consider switching to hydralazine which I agree with Also consider amlodipine if necessary Continue thyroid disease Continue conservative management for now Signed: Yolonda Kida MD 09/11/2019, 11:43 AM

## 2019-09-11 NOTE — Progress Notes (Signed)
Physical Therapy Evaluation Patient Details Name: Melanie Young MRN: [id number] DOB: 07-08-1941 Today's Date: 09/11/2019   History of Present Illness  per MD: [name] is a 79 y.o. female with medical history significant of breast cancer, dementia, depression, diabetes, hyperlipidemia, hypertension and CKD came to ED after having a unwitnessed fall at her facility.  Clinical Impression  Patient is alert to name and time. She has eyes closed and minimal verbal communication during eval. She has pain 8/10 RLE. She attempts to roll to the left and is limited to being able to turn her head to the left side and no effort towards rolling to the left. She yells out with mobility at this time and is total assist for all mobility. She is total assist for supine<> sit and is not able to stand up due to weakness and pain RLE. She has 2/5 strength in BLE and -3/5 strength RUE and LUE. She has tremor in RUE with movement. She is limited by her pain. She will continue to benefit from skilled PT to improve mobility and strength.     Follow Up Recommendations SNF    Equipment Recommendations  None recommended by PT    Recommendations for Other Services       Precautions / Restrictions Restrictions Weight Bearing Restrictions: No      Mobility  Bed Mobility Overal bed mobility: Needs Assistance Bed Mobility: Supine to Sit;Sit to Supine     Supine to sit: Total assist Sit to supine: Total assist   General bed mobility comments: unable to provide much effort due to pain LLE  Transfers Overall transfer level: (total lift , dependent)                  Ambulation/Gait                Stairs            Wheelchair Mobility    Modified Rankin (Stroke Patients Only)       Balance Overall balance assessment: (deferred due to pain, Patient crying and yelling out)                                           Pertinent Vitals/Pain Pain Assessment:  0-10 Pain Score: 8  Pain Location: (RLE) Pain Descriptors / Indicators: Aching Pain Intervention(s): Limited activity within patient's tolerance;Monitored during session    Home Living Family/patient expects to be discharged to:: Skilled nursing facility                      Prior Function Level of Independence: Independent with assistive device(s)         Comments: (pt reports she was ambulationg with RW)     Hand Dominance   Dominant Hand: Right    Extremity/Trunk Assessment   Upper Extremity Assessment Upper Extremity Assessment: Generalized weakness    Lower Extremity Assessment Lower Extremity Assessment: Generalized weakness(2/5 BLE hips and knees)       Communication   Communication: No difficulties(pt is crying and tearful)  Cognition Arousal/Alertness: Awake/alert Behavior During Therapy: (keeps eyes closed, minimal verbal) Overall Cognitive Status: Within Functional Limits for tasks assessed  General Comments      Exercises     Assessment/Plan    PT Assessment Patient needs continued PT services  PT Problem List Decreased strength;Decreased mobility       PT Treatment Interventions Gait training;Therapeutic activities;Therapeutic exercise    PT Goals (Current goals can be found in the Care Plan section)  Acute Rehab PT Goals Patient Stated Goal: no goals stated PT Goal Formulation: Patient unable to participate in goal setting Time For Goal Achievement: 09/25/19 Potential to Achieve Goals: Fair    Frequency Min 2X/week   Barriers to discharge Decreased caregiver support      Co-evaluation               AM-PAC PT "6 Clicks" Mobility  Outcome Measure Help needed turning from your back to your side while in a flat bed without using bedrails?: Total Help needed moving from lying on your back to sitting on the side of a flat bed without using bedrails?: Total Help  needed moving to and from a bed to a chair (including a wheelchair)?: Total Help needed standing up from a chair using your arms (e.g., wheelchair or bedside chair)?: Total Help needed to walk in hospital room?: Total Help needed climbing 3-5 steps with a railing? : Total 6 Click Score: 6    End of Session   Activity Tolerance: Patient limited by fatigue;Patient limited by lethargy;Patient limited by pain Patient left: in bed;with bed alarm set Nurse Communication: Mobility status PT Visit Diagnosis: Muscle weakness (generalized) (M62.81);Difficulty in walking, not elsewhere classified (R26.2)    Time: 1027-1100 PT Time Calculation (min) (ACUTE ONLY): 33 min   Charges:   PT Evaluation $PT Eval Low Complexity: 1 Low PT Treatments $Therapeutic Activity: 8-22 mins          Alanson Puls, PT DPT 09/11/2019, 10:49 AM

## 2019-09-12 ENCOUNTER — Inpatient Hospital Stay
Admit: 2019-09-12 | Discharge: 2019-09-12 | Disposition: A | Discharge status: Home / Self Care | Payer: Medicare Other | Attending: Internal Medicine | Admitting: Internal Medicine

## 2019-09-12 DIAGNOSIS — R9431 Abnormal electrocardiogram [ECG] [EKG]: Secondary | ICD-10-CM

## 2019-09-12 LAB — URINE CULTURE

## 2019-09-12 LAB — MRSA PCR SCREENING: MRSA by PCR: POSITIVE — AB

## 2019-09-12 LAB — BASIC METABOLIC PANEL
Anion gap: 11 (ref 5–15)
BUN: 23 mg/dL (ref 8–23)
CO2: 23 mmol/L (ref 22–32)
Calcium: 8.8 mg/dL — ABNORMAL LOW (ref 8.9–10.3)
Chloride: 106 mmol/L (ref 98–111)
Creatinine, Ser: 1.28 mg/dL — ABNORMAL HIGH (ref 0.44–1.00)
GFR calc Af Amer: 47 mL/min — ABNORMAL LOW (ref >60)
GFR calc non Af Amer: 41 mL/min — ABNORMAL LOW (ref >60)
Glucose, Bld: 129 mg/dL — ABNORMAL HIGH (ref 70–99)
Potassium: 3.8 mmol/L (ref 3.5–5.1)
Sodium: 140 mmol/L (ref 135–145)

## 2019-09-12 LAB — ECHOCARDIOGRAM COMPLETE
Height: 67 in
Weight: 3731.2 oz

## 2019-09-12 LAB — GLUCOSE, CAPILLARY
Glucose-Capillary: 121 mg/dL — ABNORMAL HIGH (ref 70–99)
Glucose-Capillary: 130 mg/dL — ABNORMAL HIGH (ref 70–99)

## 2019-09-12 MED ORDER — MUPIROCIN 2 % EX OINT
1.0000 "application " | TOPICAL_OINTMENT | Freq: Two times a day (BID) | CUTANEOUS | Status: DC
  Administered 2019-09-12: 1 via NASAL
  Filled 2019-09-12: qty 22

## 2019-09-12 MED ORDER — PERFLUTREN LIPID MICROSPHERE
1.0000 mL | INTRAVENOUS | Status: AC | PRN
  Administered 2019-09-12: 09:00:00 2 mL via INTRAVENOUS
  Filled 2019-09-12: qty 10

## 2019-09-12 MED ORDER — LEVOTHYROXINE SODIUM 175 MCG PO TABS: 175.0000 ug | ORAL_TABLET | Freq: Every day | ORAL | Status: DC

## 2019-09-12 MED ORDER — OXYCODONE HCL 10 MG PO TABS: 10.0000 mg | ORAL_TABLET | Freq: Three times a day (TID) | ORAL | 0 refills | Status: DC | PRN

## 2019-09-12 MED ORDER — ASPIRIN EC 81 MG PO TBEC: 81.0000 mg | DELAYED_RELEASE_TABLET | Freq: Every day | ORAL | 0 refills | Status: AC

## 2019-09-12 MED ORDER — CHLORHEXIDINE GLUCONATE CLOTH 2 % EX PADS
6.0000 | MEDICATED_PAD | Freq: Every day | CUTANEOUS | Status: DC
  Administered 2019-09-12: 06:00:00 6 via TOPICAL

## 2019-09-12 NOTE — TOC Initial Note (Signed)
Transition of Care [facility]) - Initial/Assessment Note    Patient Details  Name: Melanie Young MRN: [id number] Date of Birth: 04-24-1941  [facility] CM/SW Contact:    Eileen Stanford, LCSW Phone Number: 09/12/2019, 9:46 AM  Clinical Narrative:   CSW spoke with pt's son [name]. It is noted in the chart that pt's son is POA. Pt's son confirmed pt is from [facility] (ALF). Pt's son is agreeable to SNF. Pt's son states [facility] would be most convenient. Pt has been to [facility] before. CSW will send referral and follow up with SNF. If accepted CSW will initiate insurance auth.            Expected Discharge Plan: Skilled Nursing Facility Barriers to Discharge: Continued Medical Work up, Ship broker   Patient Goals and CMS Choice Patient states their goals for this hospitalization and ongoing recovery are:: To get pt back home CMS Medicare.gov Compare Post Acute Care list provided to:: Patient Represenative (must comment)(presented to pt's son via telephone) Choice offered to / list presented to : Adult Children  Expected Discharge Plan and Services Expected Discharge Plan: [place] In-house Referral: Clinical Social Work   Post Acute Care Choice: [place] Living arrangements for the past 2 months: [place]                                      Prior Living Arrangements/Services Living arrangements for the past 2 months: [place] Lives with:: Self Patient language and need for interpreter reviewed:: Yes Do you feel safe going back to the place where you live?: Yes      Need for Family Participation in Patient Care: Yes (Comment) Care giver support system in place?: Yes (comment)   Criminal Activity/Legal Involvement Pertinent to Current Situation/Hospitalization: No - Comment as needed  Activities of Daily Living Home Assistive Devices/Equipment: Walker (specify type) ADL Screening (condition  at time of admission) Patient's cognitive ability adequate to safely complete daily activities?: No Is the patient deaf or have difficulty hearing?: No Does the patient have difficulty seeing, even when wearing glasses/contacts?: No Does the patient have difficulty concentrating, remembering, or making decisions?: Yes Patient able to express need for assistance with ADLs?: Yes Does the patient have difficulty dressing or bathing?: Yes Independently performs ADLs?: No Communication: Independent Dressing (OT): Needs assistance Is this a change from baseline?: Pre-admission baseline Grooming: Needs assistance Is this a change from baseline?: Pre-admission baseline Feeding: Independent Bathing: Needs assistance Is this a change from baseline?: Pre-admission baseline Toileting: Needs assistance Is this a change from baseline?: Pre-admission baseline In/Out Bed: Needs assistance Is this a change from baseline?: Pre-admission baseline Walks in Home: Needs assistance Is this a change from baseline?: Pre-admission baseline Does the patient have difficulty walking or climbing stairs?: Yes Weakness of Legs: Right Weakness of Arms/Hands: Right  Permission Sought/Granted Permission sought to share information with : Family Supports    Share Information with NAME: Faun Bauser  Permission granted to share info w AGENCY: [facility]  Permission granted to share info w Relationship: son, POA  Permission granted to share info w Contact Information: [phone]  Emotional Assessment Appearance:: Appears stated age Attitude/Demeanor/Rapport: Unable to Assess Affect (typically observed): Unable to Assess Orientation: : Oriented to Self, Oriented to Place Alcohol / Substance Use: Not Applicable Psych Involvement: No (comment)  Admission diagnosis:  Dizziness [R42] Symptomatic sinus bradycardia [  R00.1] AKI (acute kidney injury) ([place]) [N17.9] Fall, initial encounter [W19.XXXA] Urinary tract  infection in elderly patient [N39.0] Patient Active Problem List   Diagnosis Date Noted  . Sinus bradycardia 09/10/2019  . Fall   . Dizziness   . Urinary tract infection in elderly patient   . AKI (acute kidney injury) ([place]) 08/10/2017   PCP:  System, Pcp Not In Pharmacy:   [place], [place] [place] [address] Phone: [phone] Fax: [phone]     Social Determinants of Health (SDOH) Interventions    Readmission Risk Interventions No flowsheet data found.

## 2019-09-12 NOTE — NC FL2 (Addendum)
[place] LEVEL OF CARE SCREENING TOOL     IDENTIFICATION  Patient Name: Melanie Young Birthdate: 1941-03-21 Sex: female Admission Date (Current Location): 09/10/2019  [place] and [place] Number:  Engineering geologist and Address:  [facility], [address], [place], [place] [address]      Provider Number: [id number]  Attending Physician Name and Address:  Lorella Nimrod, MD  Relative Name and Phone Number:       Current Level of Care: Hospital Recommended Level of Care: [place] Prior Approval Number:     Date Approved/Denied:   PASRR Number:  [id number] E Expires: 10/12/19  Discharge Plan: SNF    Current Diagnoses: Patient Active Problem List   Diagnosis Date Noted  . Sinus bradycardia 09/10/2019  . Fall   . Dizziness   . Urinary tract infection in elderly patient   . AKI (acute kidney injury) ([place]) 08/10/2017    Orientation RESPIRATION BLADDER Height & Weight     Place, Self  Normal External catheter, Incontinent(cath placed 09/10/19) Weight: 233 lb 3.2 oz (105.8 kg) Height:  5\' 7"  (170.2 cm)  BEHAVIORAL SYMPTOMS/MOOD NEUROLOGICAL BOWEL NUTRITION STATUS      Continent Diet(heart healthy/carb modified, thin liquids)  AMBULATORY STATUS COMMUNICATION OF NEEDS Skin   Extensive Assist Verbally Normal                       Personal Care Assistance Level of Assistance  Bathing, Feeding, Dressing Bathing Assistance: Maximum assistance Feeding assistance: Maximum assistance Dressing Assistance: Maximum assistance     Functional Limitations Info  Hearing, Speech, Sight Sight Info: Adequate Hearing Info: Adequate Speech Info: Adequate    SPECIAL CARE FACTORS FREQUENCY  PT (By licensed PT), OT (By licensed OT)     PT Frequency: 5x OT Frequency: 5x            Contractures Contractures Info: Not present    Additional Factors Info  Code Status, Allergies Code Status Info: Full  Code Allergies Info: No known allergies           Current Medications (09/12/2019):  This is the current hospital active medication list Current Facility-Administered Medications  Medication Dose Route Frequency Provider Last Rate Last Admin  . acetaminophen (TYLENOL) tablet 650 mg  650 mg Oral Q6H PRN Lorella Nimrod, MD   650 mg at 09/10/19 2012   Or  . acetaminophen (TYLENOL) suppository 650 mg  650 mg Rectal Q6H PRN Lorella Nimrod, MD      . aspirin EC tablet 325 mg  325 mg Oral Daily Lorella Nimrod, MD   325 mg at 09/11/19 1220  . atorvastatin (LIPITOR) tablet 40 mg  40 mg Oral QHS Lorella Nimrod, MD   40 mg at 09/11/19 2305  . busPIRone (BUSPAR) tablet 10 mg  10 mg Oral BID Lorella Nimrod, MD   10 mg at 09/11/19 2304  . cefTRIAXone (ROCEPHIN) 1 g in sodium chloride 0.9 % 100 mL IVPB  1 g Intravenous Q24H Lorella Nimrod, MD 200 mL/hr at 09/11/19 1656 1 g at 09/11/19 1656  . Chlorhexidine Gluconate Cloth 2 % PADS 6 each  6 each Topical Q0600 Lorella Nimrod, MD   6 each at 09/12/19 0610  . enoxaparin (LOVENOX) injection 40 mg  40 mg Subcutaneous Q24H Hallaji, Sheema M, RPH   40 mg at 09/11/19 2304  . hydrALAZINE (APRESOLINE) tablet 25 mg  25 mg Oral Q8H Lorella Nimrod, MD   25 mg at 09/12/19  67  . insulin aspart (novoLOG) injection 0-5 Units  0-5 Units Subcutaneous QHS Amin, Soundra Pilon, MD      . insulin aspart (novoLOG) injection 0-9 Units  0-9 Units Subcutaneous TID WC Lorella Nimrod, MD   1 Units at 09/12/19 0816  . lamoTRIgine (LAMICTAL) tablet 200 mg  200 mg Oral Daily Lorella Nimrod, MD   200 mg at 09/11/19 1217  . levothyroxine (SYNTHROID) tablet 175 mcg  175 mcg Oral Daily Lorella Nimrod, MD   175 mcg at 09/12/19 0549  . LORazepam (ATIVAN) tablet 0.5 mg  0.5 mg Oral BID PRN Lorella Nimrod, MD   0.5 mg at 09/10/19 2013  . mupirocin ointment (BACTROBAN) 2 % 1 application  1 application Nasal BID Lorella Nimrod, MD      . oxybutynin (DITROPAN) tablet 2.5 mg  2.5 mg Oral BID Lorella Nimrod, MD   2.5  mg at 09/11/19 2305  . oxyCODONE (Oxy IR/ROXICODONE) immediate release tablet 10 mg  10 mg Oral TID Lorella Nimrod, MD   10 mg at 09/11/19 2305  . pantoprazole (PROTONIX) EC tablet 40 mg  40 mg Oral Daily Lorella Nimrod, MD   40 mg at 09/11/19 1220  . polyethylene glycol (MIRALAX / GLYCOLAX) packet 17 g  17 g Oral Daily PRN Lorella Nimrod, MD      . pregabalin (LYRICA) capsule 100 mg  100 mg Oral BID Lorella Nimrod, MD   100 mg at 09/11/19 2306  . QUEtiapine (SEROQUEL) tablet 50 mg  50 mg Oral QHS Lorella Nimrod, MD   50 mg at 09/11/19 2305  . sodium chloride flush (NS) 0.9 % injection 3 mL  3 mL Intravenous Q12H Lorella Nimrod, MD   3 mL at 09/11/19 2304     Discharge Medications: Please see discharge summary for a list of discharge medications.  Relevant Imaging Results:  Relevant Lab Results:   Additional Information [id number]  Eileen Stanford, LCSW

## 2019-09-12 NOTE — Discharge Summary (Signed)
Physician Discharge Summary  Melanie Young [id number] DOB: 1941-07-11 DOA: 09/10/2019  PCP: System, Pcp Not In  Admit date: 09/10/2019 Discharge date: 09/12/2019  Admitted From: Assisted living facility Disposition:  SNF  Recommendations for Outpatient Follow-up:  1. Follow up with PCP in 1-2 weeks 2. Follow-up with cardiology in 1 to 2 weeks. 3. Repeat TSH in 4 weeks. 4. Please obtain BMP/CBC in one week 5. Please follow up on the following pending results:None  Home Health: No Equipment/Devices: Rolling walker Discharge Condition: Stable CODE STATUS: Full Diet recommendation: Heart Healthy / Carb Modified   Brief/Interim Summary: Melanie Rogersis a 79 y.o.femalewith medical history significant ofbreast cancer, dementia, depression, diabetes, hyperlipidemia, hypertension and CKD came to ED after having a unwitnessed fall at her facility. Patient felt a little dizzy before falling,hurt her right side and complaining of right hip pain.According to patient she has 2 falls in the prior week with similar symptoms.  Fall. No radiologic evidence of any fracture.  Found to have bradycardic and admitted for presyncopal most likely secondary to bradycardia and polypharmacy. Patient on multiple medications which can cause dizzinessincluding Lyrica, Seroquel, Lamictal and oxycodone 3 times a day. She does has bradycardia on EKG which is present on prior EKGs too.May be vasovagal secondary to dehydration.  We make her oxycodone as needed with the hope that can be backed off.  Decrease the dose of trazodone.  Her primary care physician needs to work with her for further management of her pain. PT/OT were recommending SNF.  She was discharged to SNF for rehab.   AKI. She also had AKI, most likely secondary to dehydration, patient was also on torsemide and losartan, we held her losartan and torsemide during hospitalization, resulted in improvement of her renal function. She can resume  ACE inhibitor and torsemide from tomorrow. Encourage patient to keep herself well-hydrated.  Bradycardia.  Patient's heart rate remained mostly in 40s which seems chronic. Cardiology was consulted and they do not think that she needs any pacemaker at this time.  They were advising outpatient follow-up in 1 to 2 weeks for Holter monitor and further management.  Patient is not on any nodal blocking agent. Keeping her well-hydrated will help.  Orthostatic vitals were negative.  Possible UTI.  Her UA look infected.  No urinary symptoms.  She was treated with 3 days of ceftriaxone in hospital and does not need any continuation of antibiotics at this time..  Well-controlled diabetes.  A1c of 6.3 Will continue with her home dose of linagliptin and Metformin.  Hypothyroidism.  TSH was high at 19.201, might be contributing to her bradycardia. -We increased the dose of Synthroid to 175 MCG. -She will need a repeat TSH in 4 weeks.  Discharge Diagnoses:  Active Problems:   AKI (acute kidney injury) ([place])   Sinus bradycardia  Discharge Instructions  Discharge Instructions    Diet - low sodium heart healthy   Complete by: As directed    Increase activity slowly   Complete by: As directed      Allergies as of 09/12/2019   No Known Allergies     Medication List    TAKE these medications   acetaminophen 325 MG tablet Commonly known as: TYLENOL Take 650 mg by mouth every 4 (four) hours as needed for mild pain. What changed: Another medication with the same name was removed. Continue taking this medication, and follow the directions you see here.   alum & mag hydroxide-simeth 200-200-20 MG/5ML suspension Commonly known as: MAALOX/MYLANTA Take  30 mLs by mouth every 6 (six) hours as needed for indigestion or heartburn.   aspirin EC 81 MG tablet Take 1 tablet (81 mg total) by mouth daily. What changed:   medication strength  how much to take   atorvastatin 40 MG tablet Commonly known  as: LIPITOR Take 40 mg by mouth at bedtime.   busPIRone 10 MG tablet Commonly known as: BUSPAR Take 10 mg by mouth 2 (two) times daily.   calcium carbonate 500 MG chewable tablet Commonly known as: TUMS - dosed in mg elemental calcium Chew 1 tablet by mouth 2 (two) times daily.   cholecalciferol 25 MCG (1000 UNIT) tablet Commonly known as: VITAMIN D3 Take 1,000 Units by mouth daily.   guaifenesin 100 MG/5ML syrup Commonly known as: ROBITUSSIN Take 200 mg by mouth every 6 (six) hours as needed for cough.   lamoTRIgine 200 MG tablet Commonly known as: LAMICTAL Take 200 mg by mouth daily.   levothyroxine 175 MCG tablet Commonly known as: SYNTHROID Take 1 tablet (175 mcg total) by mouth daily. Start taking on: September 13, 2019 What changed:   medication strength  how much to take   linagliptin 5 MG Tabs tablet Commonly known as: TRADJENTA Take 5 mg by mouth daily.   loperamide 2 MG capsule Commonly known as: IMODIUM Take 2 mg by mouth as needed for diarrhea or loose stools ((max 8 doses in 24 hours)).   LORazepam 0.5 MG tablet Commonly known as: ATIVAN Take 0.5 mg by mouth 2 (two) times daily as needed for anxiety (agitation).   losartan 50 MG tablet Commonly known as: COZAAR Take 50 mg by mouth daily.   Lyrica 100 MG capsule Generic drug: pregabalin Take 100 mg by mouth 2 (two) times daily.   magnesium hydroxide 400 MG/5ML suspension Commonly known as: MILK OF MAGNESIA Take 30 mLs by mouth at bedtime as needed for mild constipation or moderate constipation.   metFORMIN 500 MG tablet Commonly known as: GLUCOPHAGE Take 750 mg by mouth daily.   neomycin-bacitracin-polymyxin ointment Commonly known as: NEOSPORIN Apply 1 application topically as needed for wound care.   omeprazole 20 MG capsule Commonly known as: PRILOSEC Take 20 mg by mouth daily.   ondansetron 4 MG tablet Commonly known as: ZOFRAN Take 4 mg by mouth every 6 (six) hours as needed for  nausea or vomiting.   oxybutynin 5 MG tablet Commonly known as: DITROPAN Take 2.5 mg by mouth 2 (two) times daily.   Oxycodone HCl 10 MG Tabs Take 1 tablet (10 mg total) by mouth 3 (three) times daily as needed (For pain). What changed:   when to take this  reasons to take this   ProAir HFA 108 (90 Base) MCG/ACT inhaler Generic drug: albuterol Inhale 1 puff into the lungs every 6 (six) hours as needed for wheezing or shortness of breath.   QUEtiapine 50 MG tablet Commonly known as: SEROQUEL Take 25 mg by mouth daily. What changed: Another medication with the same name was removed. Continue taking this medication, and follow the directions you see here.   torsemide 20 MG tablet Commonly known as: DEMADEX Take 20 mg by mouth daily.   trolamine salicylate 10 % cream Commonly known as: ASPERCREME Apply 1 application topically 3 (three) times daily as needed for muscle pain.      Contact information for after-discharge care    Destination    [facility]erred [facility]F .   Service: Skilled Nursing Contact information: [place]  [place] [phone]             No Known Allergies  Consultations:  Cardiology  Procedures/Studies: CT Head Wo Contrast  Result Date: 09/10/2019 CLINICAL DATA:  Fall EXAM: CT HEAD WITHOUT CONTRAST CT CERVICAL SPINE WITHOUT CONTRAST TECHNIQUE: Multidetector CT imaging of the head and cervical spine was performed following the standard protocol without intravenous contrast. Multiplanar CT image reconstructions of the cervical spine were also generated. COMPARISON:  CT head 07/13/2019 FINDINGS: CT HEAD FINDINGS Brain: No evidence of acute infarction, hemorrhage, hydrocephalus, extra-axial collection or mass lesion/mass effect. Mild cerebral atrophy. Vascular: Negative for hyperdense vessel Skull: Negative for skull fracture Sinuses/Orbits: Negative Other: None CT CERVICAL SPINE FINDINGS Alignment:  Normal Skull base and vertebrae: Negative for fracture Soft tissues and spinal canal: Negative Disc levels: Multilevel disc and facet degeneration. Disc degeneration and spurring most prominent at C5-6 with mild spinal stenosis Upper chest: Lung apices clear bilaterally. Other: None IMPRESSION: No acute intracranial abnormality Negative for cervical spine fracture. Electronically Signed   By: Franchot Gallo M.D.   On: 09/10/2019 10:20   CT Cervical Spine Wo Contrast  Result Date: 09/10/2019 CLINICAL DATA:  Fall EXAM: CT HEAD WITHOUT CONTRAST CT CERVICAL SPINE WITHOUT CONTRAST TECHNIQUE: Multidetector CT imaging of the head and cervical spine was performed following the standard protocol without intravenous contrast. Multiplanar CT image reconstructions of the cervical spine were also generated. COMPARISON:  CT head 07/13/2019 FINDINGS: CT HEAD FINDINGS Brain: No evidence of acute infarction, hemorrhage, hydrocephalus, extra-axial collection or mass lesion/mass effect. Mild cerebral atrophy. Vascular: Negative for hyperdense vessel Skull: Negative for skull fracture Sinuses/Orbits: Negative Other: None CT CERVICAL SPINE FINDINGS Alignment: Normal Skull base and vertebrae: Negative for fracture Soft tissues and spinal canal: Negative Disc levels: Multilevel disc and facet degeneration. Disc degeneration and spurring most prominent at C5-6 with mild spinal stenosis Upper chest: Lung apices clear bilaterally. Other: None IMPRESSION: No acute intracranial abnormality Negative for cervical spine fracture. Electronically Signed   By: Franchot Gallo M.D.   On: 09/10/2019 10:20   ECHOCARDIOGRAM COMPLETE  Result Date: 09/12/2019   ECHOCARDIOGRAM REPORT   Patient Name:   Melanie Young Date of Exam: 09/12/2019 Medical Rec #:  [id number]      Height:       67.0 in Accession #:    [id number]     Weight:       233.2 lb Date of Birth:  August 06, 1941       BSA:          2.16 m Patient Age:    50 years       BP:           144/81  mmHg Patient Gender: F              HR:           46 bpm. Exam Location:  [facility] Procedure: 2D Echo, Color Doppler, Cardiac Doppler and Intracardiac            Opacification Agent Indications:     R94.31 Abnormal ECG  History:         Patient has no prior history of Echocardiogram examinations.                  CKD; Risk Factors:Hypertension, HCL and Diabetes. Breast                  cancer.  Sonographer:     Charmayne Sheer RDCS (AE)  Referring Phys:  [id number] [facility] Harjas Biggins Diagnosing Phys: Serafina Royals MD  Sonographer Comments: Technically difficult study due to poor echo windows. Image acquisition challenging due to patient body habitus. IMPRESSIONS  1. Left ventricular ejection fraction, by visual estimation, is 55 to 60%. The left ventricle has normal function. There is no left ventricular hypertrophy.  2. Definity contrast agent was given IV to delineate the left ventricular endocardial borders.  3. The left ventricle has no regional wall motion abnormalities.  4. Global right ventricle has normal systolic function.The right ventricular size is normal. No increase in right ventricular wall thickness.  5. Left atrial size was normal.  6. Right atrial size was normal.  7. The mitral valve is normal in structure. Trivial mitral valve regurgitation.  8. The tricuspid valve is normal in structure.  9. The tricuspid valve is normal in structure. Tricuspid valve regurgitation is trivial. 10. The aortic valve is normal in structure. Aortic valve regurgitation is not visualized. 11. The pulmonic valve was normal in structure. Pulmonic valve regurgitation is not visualized. FINDINGS  Left Ventricle: Left ventricular ejection fraction, by visual estimation, is 55 to 60%. The left ventricle has normal function. Definity contrast agent was given IV to delineate the left ventricular endocardial borders. The left ventricle has no regional wall motion abnormalities. There is no left ventricular hypertrophy. Right Ventricle: The right  ventricular size is normal. No increase in right ventricular wall thickness. Global RV systolic function is has normal systolic function. Left Atrium: Left atrial size was normal in size. Right Atrium: Right atrial size was normal in size Pericardium: There is no evidence of pericardial effusion. Mitral Valve: The mitral valve is normal in structure. Trivial mitral valve regurgitation. MV peak gradient, 2.4 mmHg. Tricuspid Valve: The tricuspid valve is normal in structure. Tricuspid valve regurgitation is trivial. Aortic Valve: The aortic valve is normal in structure. Aortic valve regurgitation is not visualized. Aortic valve mean gradient measures 4.0 mmHg. Aortic valve peak gradient measures 7.0 mmHg. Aortic valve area, by VTI measures 2.66 cm. Pulmonic Valve: The pulmonic valve was normal in structure. Pulmonic valve regurgitation is not visualized. Pulmonic regurgitation is not visualized. Aorta: The aortic root, ascending aorta and aortic arch are all structurally normal, with no evidence of dilitation or obstruction. IAS/Shunts: No atrial level shunt detected by color flow Doppler.  LEFT VENTRICLE PLAX 2D LVIDd:         3.79 cm  Diastology LVIDs:         2.57 cm  LV e' lateral:   7.40 cm/s LV PW:         0.80 cm  LV E/e' lateral: 9.6 LV IVS:        0.86 cm  LV e' medial:    5.98 cm/s LVOT diam:     2.20 cm  LV E/e' medial:  11.9 LV SV:         38 ml LV SV Index:   16.51 LVOT Area:     3.80 cm  LEFT ATRIUM         Index LA diam:    2.90 cm 1.34 cm/m  AORTIC VALVE                   PULMONIC VALVE AV Area (Vmax):    2.79 cm    PV Vmax:       1.17 m/s AV Area (Vmean):   2.86 cm    PV Vmean:      72.600 cm/s AV Area (  VTI):     2.66 cm    PV VTI:        0.264 m AV Vmax:           132.00 cm/s PV Peak grad:  5.5 mmHg AV Vmean:          96.000 cm/s PV Mean grad:  2.0 mmHg AV VTI:            0.334 m AV Peak Grad:      7.0 mmHg AV Mean Grad:      4.0 mmHg LVOT Vmax:         96.80 cm/s LVOT Vmean:        72.300 cm/s  LVOT VTI:          0.234 m LVOT/AV VTI ratio: 0.70  AORTA Ao Root diam: 3.30 cm MITRAL VALVE MV Area (PHT): 2.29 cm             SHUNTS MV Peak grad:  2.4 mmHg             Systemic VTI:  0.23 m MV Mean grad:  1.0 mmHg             Systemic Diam: 2.20 cm MV Vmax:       0.78 m/s MV Vmean:      44.9 cm/s MV VTI:        0.37 m MV PHT:        95.99 msec MV Decel Time: 331 msec MV E velocity: 71.30 cm/s 103 cm/s MV A velocity: 54.30 cm/s 70.3 cm/s MV E/A ratio:  1.31       1.5  Serafina Royals MD Electronically signed by Serafina Royals MD Signature Date/Time: 09/12/2019/1:00:27 PM    Final    DG Hip Unilat With Pelvis 2-3 Views Right  Result Date: 09/10/2019 CLINICAL DATA:  Unwitnessed fall, RIGHT hip bruising. EXAM: DG HIP (WITH OR WITHOUT PELVIS) 2-3V RIGHT COMPARISON:  None. FINDINGS: Osseous alignment is within normal limits. No hip fracture or dislocation is seen. Mild degenerative spurring at the bilateral hip joints. Additional degenerative changes within the lower lumbar spine, at least moderate in degree, incompletely imaged. Soft tissues about the pelvis and RIGHT hip are unremarkable. IMPRESSION: 1. No acute findings. No osseous fracture or dislocation. 2. Mild degenerative change at the bilateral hip joints. Electronically Signed   By: Franki Cabot M.D.   On: 09/10/2019 09:48    Subjective: Patient continued to experience generalized malaise and intermittent dizziness.  She does not want to go back to the place from where she came from, PT and OT were recommending SNF placement, she agreed to go to a skilled nursing facility.  Discharge Exam: Vitals:   09/12/19 0330 09/12/19 0754  BP: (!) 150/70 (!) 144/81  Pulse: (!) 45 (!) 53  Resp: 18 16  Temp: 98.3 F (36.8 C) 97.8 F (36.6 C)  SpO2: 98% 97%   Vitals:   09/11/19 1609 09/11/19 1948 09/12/19 0330 09/12/19 0754  BP: 124/78 (!) 152/72 (!) 150/70 (!) 144/81  Pulse: (!) 46 (!) 50 (!) 45 (!) 53  Resp: 16 20 18 16   Temp: 98.4 F (36.9 C)  98 F (36.7 C) 98.3 F (36.8 C) 97.8 F (36.6 C)  TempSrc: Oral Oral Oral Oral  SpO2: 100% 99% 98% 97%  Weight:      Height:        General: Pt is alert, awake, not in acute distress Cardiovascular: RRR, S1/S2 +, no rubs, no gallops Respiratory:  CTA bilaterally, no wheezing, no rhonchi Abdominal: Soft, NT, ND, bowel sounds + Extremities: no edema, no cyanosis   The results of significant diagnostics from this hospitalization (including imaging, microbiology, ancillary and laboratory) are listed below for reference.    Microbiology: Recent Results (from the past 240 hour(s))  Urine Culture     Status: Abnormal   Collection Time: 09/10/19 11:37 AM   Specimen: Urine, Random  Result Value Ref Range Status   Specimen Description   Final    URINE, RANDOM Performed at [facility], [address], [place], [place] [address]    Special Requests   Final    NONE Performed at [facility], [address] [place], [place] [address]    Culture MULTIPLE SPECIES PRESENT, SUGGEST RECOLLECTION (A)  Final   Report Status 09/12/2019 FINAL  Final  SARS CORONAVIRUS 2 (TAT 6-24 HRS) Nasopharyngeal Nasopharyngeal Swab     Status: None   Collection Time: 09/10/19  3:54 PM   Specimen: Nasopharyngeal Swab  Result Value Ref Range Status   SARS Coronavirus 2 NEGATIVE NEGATIVE Final    Comment: (NOTE) SARS-CoV-2 target nucleic acids are NOT DETECTED. The SARS-CoV-2 RNA is generally detectable in upper and lower respiratory specimens during the acute phase of infection. Negative results do not preclude SARS-CoV-2 infection, do not rule out co-infections with other pathogens, and should not be used as the sole basis for treatment or other patient management decisions. Negative results must be combined with clinical observations, patient history, and epidemiological information. The expected result is Negative. Fact Sheet for  Patients: SugarRoll.be Fact Sheet for Healthcare Providers: https://www.woods-mathews.com/ This test is not yet approved or cleared by the [place] FDA and  has been authorized for detection and/or diagnosis of SARS-CoV-2 by FDA under an Emergency Use Authorization (EUA). This EUA will remain  in effect (meaning this test can be used) for the duration of the COVID-19 declaration under Section 56 4(b)(1) of the Act, 21 U.S.C. section 360bbb-3(b)(1), unless the authorization is terminated or revoked sooner. Performed at [facility], [place] [address] [place], [place] [address]   MRSA PCR Screening     Status: Abnormal   Collection Time: 09/11/19 11:13 PM   Specimen: Nasopharyngeal  Result Value Ref Range Status   MRSA by PCR POSITIVE (A) NEGATIVE Final    Comment:        The GeneXpert MRSA Assay (FDA approved for NASAL specimens only), is one component of a comprehensive MRSA colonization surveillance program. It is not intended to diagnose MRSA infection nor to guide or monitor treatment for MRSA infections. RESULT CALLED TO, READ BACK BY AND VERIFIED WITH: LISA SCOTT 09/12/19 AT 0030 HS Performed at [facility], [address] [place], [place] [address]      Labs: BNP (last 3 results) Recent Labs    07/13/19 0954  BNP [id number]   Basic Metabolic Panel: Recent Labs  Lab 09/10/19 1334 09/11/19 0506 09/12/19 0451  NA 140 139 140  K 4.6 5.2* 3.8  CL 102 107 106  CO2 27 24 23   GLUCOSE 97 166* 129*  BUN 35* 35* 23  CREATININE 2.36* 1.94* 1.28*  CALCIUM 9.4 9.0 8.8*   Liver Function Tests: Recent Labs  Lab 09/11/19 0506  AST 30  ALT 20  ALKPHOS 97  BILITOT 0.7  PROT 6.9  ALBUMIN 3.5   No results for input(s): LIPASE, AMYLASE in the last 168 hours. No results for input(s): AMMONIA in the last  168 hours. CBC: Recent Labs  Lab 09/10/19 1334  WBC 5.3  NEUTROABS 2.0  HGB 13.5  HCT 43.1  MCV 85.2   PLT 244   Cardiac Enzymes: No results for input(s): CKTOTAL, CKMB, CKMBINDEX, TROPONINI in the last 168 hours. BNP: Invalid input(s): POCBNP CBG: Recent Labs  Lab 09/11/19 1121 09/11/19 1632 09/11/19 2149 09/12/19 0756 09/12/19 1201  GLUCAP 96 120* 117* 121* 130*   D-Dimer No results for input(s): DDIMER in the last 72 hours. Hgb A1c Recent Labs    09/10/19 1333  HGBA1C 6.3*   Lipid Profile No results for input(s): CHOL, HDL, LDLCALC, TRIG, CHOLHDL, LDLDIRECT in the last 72 hours. Thyroid function studies Recent Labs    09/10/19 1333  TSH 19.201*   Anemia work up No results for input(s): VITAMINB12, FOLATE, FERRITIN, TIBC, IRON, RETICCTPCT in the last 72 hours. Urinalysis    Component Value Date/Time   COLORURINE YELLOW (A) 09/10/2019 1137   APPEARANCEUR HAZY (A) 09/10/2019 1137   APPEARANCEUR Cloudy 11/09/2014 2357   LABSPEC 1.016 09/10/2019 1137   LABSPEC 1.013 11/09/2014 2357   PHURINE 5.0 09/10/2019 1137   GLUCOSEU NEGATIVE 09/10/2019 1137   GLUCOSEU Negative 11/09/2014 2357   HGBUR NEGATIVE 09/10/2019 1137   BILIRUBINUR NEGATIVE 09/10/2019 1137   BILIRUBINUR Negative 11/09/2014 2357   KETONESUR NEGATIVE 09/10/2019 1137   PROTEINUR NEGATIVE 09/10/2019 1137   NITRITE NEGATIVE 09/10/2019 1137   LEUKOCYTESUR TRACE (A) 09/10/2019 1137   LEUKOCYTESUR 3+ 11/09/2014 2357   Sepsis Labs Invalid input(s): PROCALCITONIN,  WBC,  LACTICIDVEN Microbiology Recent Results (from the past 240 hour(s))  Urine Culture     Status: Abnormal   Collection Time: 09/10/19 11:37 AM   Specimen: Urine, Random  Result Value Ref Range Status   Specimen Description   Final    URINE, RANDOM Performed at [facility], [address] [place], [place] [address]    Special Requests   Final    NONE Performed at [facility], [address] [place], [place] [address]    Culture MULTIPLE SPECIES PRESENT, SUGGEST RECOLLECTION (A)  Final   Report Status  09/12/2019 FINAL  Final  SARS CORONAVIRUS 2 (TAT 6-24 HRS) Nasopharyngeal Nasopharyngeal Swab     Status: None   Collection Time: 09/10/19  3:54 PM   Specimen: Nasopharyngeal Swab  Result Value Ref Range Status   SARS Coronavirus 2 NEGATIVE NEGATIVE Final    Comment: (NOTE) SARS-CoV-2 target nucleic acids are NOT DETECTED. The SARS-CoV-2 RNA is generally detectable in upper and lower respiratory specimens during the acute phase of infection. Negative results do not preclude SARS-CoV-2 infection, do not rule out co-infections with other pathogens, and should not be used as the sole basis for treatment or other patient management decisions. Negative results must be combined with clinical observations, patient history, and epidemiological information. The expected result is Negative. Fact Sheet for Patients: SugarRoll.be Fact Sheet for Healthcare Providers: https://www.woods-mathews.com/ This test is not yet approved or cleared by the [place] FDA and  has been authorized for detection and/or diagnosis of SARS-CoV-2 by FDA under an Emergency Use Authorization (EUA). This EUA will remain  in effect (meaning this test can be used) for the duration of the COVID-19 declaration under Section 56 4(b)(1) of the Act, 21 U.S.C. section 360bbb-3(b)(1), unless the authorization is terminated or revoked sooner. Performed at [facility], [place] [address], [place], [place] [address]   MRSA PCR Screening     Status: Abnormal   Collection  Time: 09/11/19 11:13 PM   Specimen: Nasopharyngeal  Result Value Ref Range Status   MRSA by PCR POSITIVE (A) NEGATIVE Final    Comment:        The GeneXpert MRSA Assay (FDA approved for NASAL specimens only), is one component of a comprehensive MRSA colonization surveillance program. It is not intended to diagnose MRSA infection nor to guide or monitor treatment for MRSA infections. RESULT CALLED TO, READ  BACK BY AND VERIFIED WITH: LISA SCOTT 09/12/19 AT 0030 HS Performed at [facility], [address] [place], [place] [address]     Time coordinating discharge: Over 30 minutes  SIGNED:  Lorella Nimrod, MD  [facility] 09/12/2019, 1:01 PM Pager [phone]  If 7PM-7AM, please contact night-coverage [email or web address] Password [email or web address]  This record has been created using Systems analyst. Errors have been sought and corrected,but may not always be located. Such creation errors do not reflect on the standard of care.

## 2019-09-12 NOTE — TOC Progression Note (Addendum)
Transition of Care [facility]) - Progression Note    Patient Details  Name: Melanie Young MRN: [id number] Date of Birth: 1941-07-05  Transition of Care [facility]) CM/SW Contact  Eileen Stanford, LCSW Phone Number: 09/12/2019, 10:44 AM  Clinical Narrative:  [facility]. Insurance auth started.   12:31- received auth for [facility]. [facility] under additional review, documents submitted.    Expected Discharge Plan: [place] Barriers to Discharge: Continued Medical Work up, Ship broker  Expected Discharge Plan and Services Expected Discharge Plan: [place] In-house Referral: Clinical Social Work   Post Acute Care Choice: [place] Living arrangements for the past 2 months: [place]                                       Social Determinants of Health (SDOH) Interventions    Readmission Risk Interventions No flowsheet data found.

## 2019-09-12 NOTE — Progress Notes (Signed)
Pt had a drop in heart rate 33 for a short time, increased to 49  Dr Reesa Chew, S aware. Pt continues to report dizziness. No new changes.

## 2019-09-12 NOTE — Progress Notes (Addendum)
IV and telemetry removed for discharge. [facility] accompanied by 2 EMTs.

## 2019-09-12 NOTE — Progress Notes (Signed)
*  PRELIMINARY RESULTS* Echocardiogram 2D Echocardiogram has been performed.  Melanie Young 09/12/2019, 9:54 AM

## 2019-09-12 NOTE — Progress Notes (Signed)
OT Cancellation Note  Patient Details Name: Melanie Young MRN: [id number] DOB: 1940/10/03   Cancelled Treatment:    Reason Eval/Treat Not Completed: Patient at procedure or test/ unavailable(Pt. is having an Echocardiogram in her room. Will reattempt the initial OT visit at a later time, or date.)  Harrel Carina, MS, OTR/L 09/12/2019, 9:09 AM

## 2019-09-12 NOTE — TOC Transition Note (Signed)
Transition of Care [facility]) - CM/SW Discharge Note   Patient Details  Name: Melanie Young MRN: [id number] Date of Birth: 23-Jan-1941  Transition of Care [facility]) CM/SW Contact:  Eileen Stanford, LCSW Phone Number: 09/12/2019, 1:16 PM   Clinical Narrative:   Clinical Social Worker facilitated patient discharge including contacting patient family and facility to confirm patient discharge plans.  Clinical information faxed to facility and family agreeable with plan.  CSW arranged ambulance transport via [facility] to [facility].  RN to call [phone] for report prior to discharge.  Final next level of care: Skilled Nursing Facility Barriers to Discharge: No Barriers Identified   Patient Goals and CMS Choice Patient states their goals for this hospitalization and ongoing recovery are:: To get pt back home CMS Medicare.gov Compare Post Acute Care list provided to:: Patient Represenative (must comment)(presented to pt's son via telephone) Choice offered to / list presented to : Adult Children  Discharge Placement              [facility] [place] Patient to be transferred to facility by: [facility] Name of family member notified: [name] Patient and family notified of of transfer: 09/12/19  Discharge Plan and Services In-house Referral: Clinical Social Work   Post Acute Care Choice: [place]                               Social Determinants of Health (SDOH) Interventions     Readmission Risk Interventions No flowsheet data found.

## 2019-09-12 NOTE — Progress Notes (Signed)
RN called and gave report to nicole at [facility] for discharge.

## 2019-09-21 IMAGING — CR DG CHEST 2V
1 series · 2 of 2 positions shown · non-contrast
Comparison: Chest radiograph dated 07/19/2015

CLINICAL DATA: 76-year-old female with confusion and leukocytosis.

EXAM:
CHEST  2 VIEW

[Series 1: dg chest 2 view · 0.14mm/px · 2 of 2 slices shown]
[im 1/2]
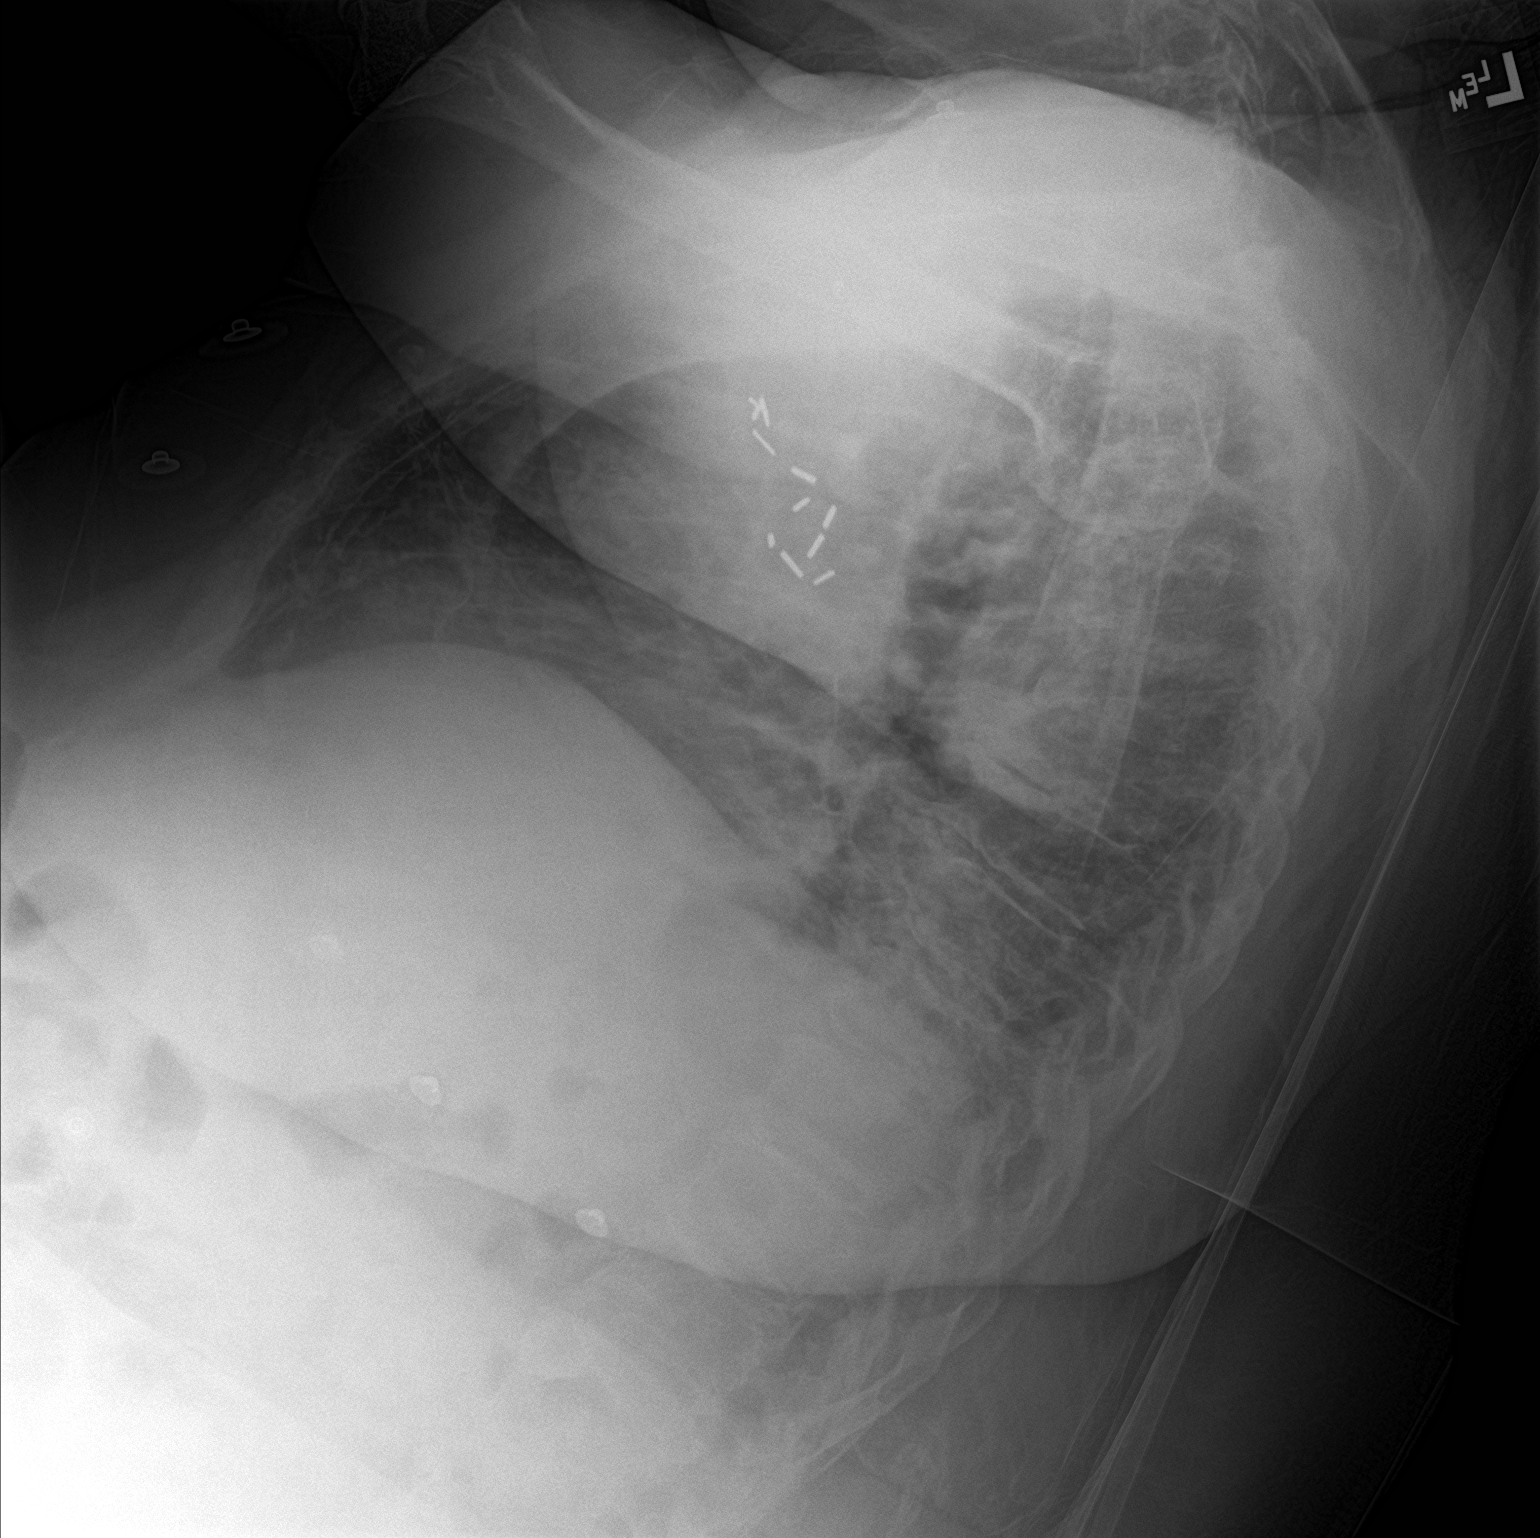
[im 2/2]
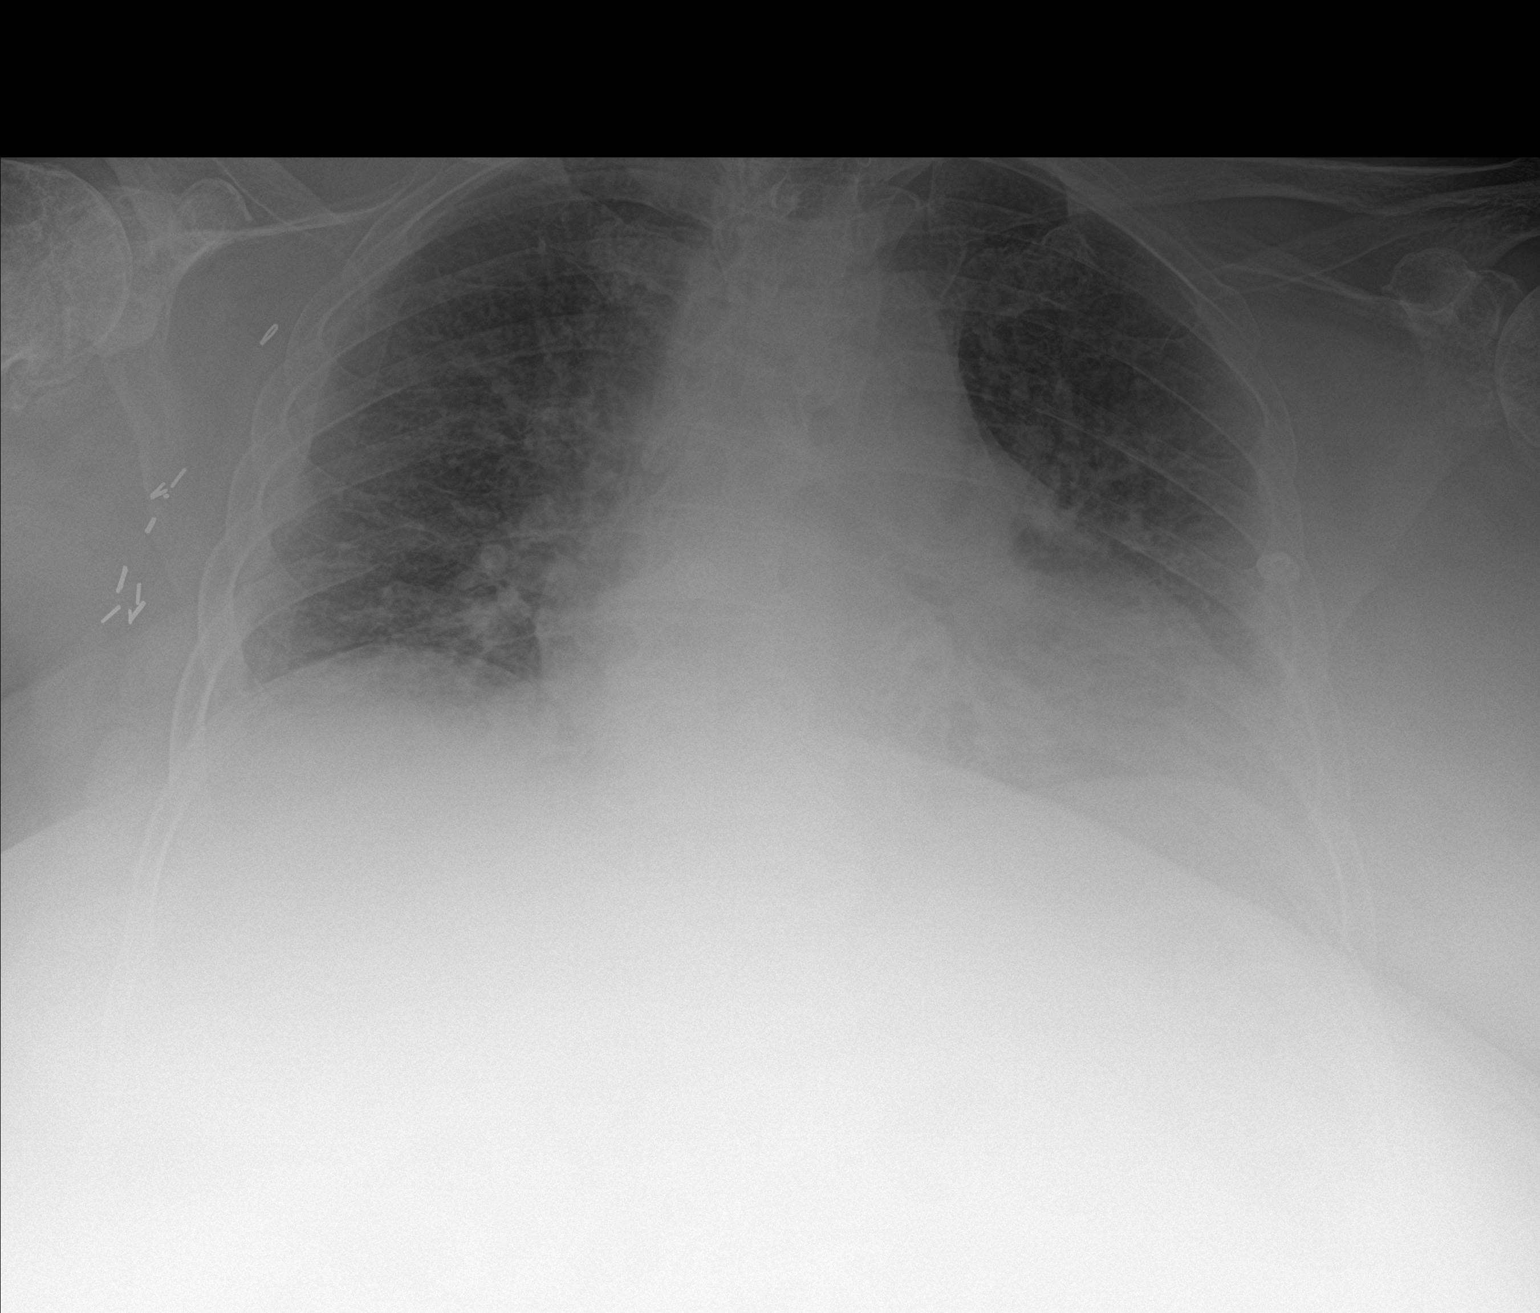

[2 of 2 positions shown; findings below may reference images not displayed]

FINDINGS: There is shallow inspiration with bibasilar atelectatic changes.
Developing infiltrate is less likely but not excluded. Clinical
correlation is recommended. There is no focal consolidation, pleural
effusion, or pneumothorax. Top-normal cardiac size. Right chest
wall/ axillary surgical clips. There is osteopenia with degenerative
changes of the spine and shoulders. No acute osseous pathology.
IMPRESSION: Shallow inspiration with bibasilar atelectasis. No focal
consolidation.

## 2019-10-31 ENCOUNTER — Emergency Department: Payer: Medicare Other

## 2019-10-31 ENCOUNTER — Other Ambulatory Visit: Payer: Self-pay

## 2019-10-31 ENCOUNTER — Emergency Department
Admission: EM | Admit: 2019-10-31 | Discharge: 2019-10-31 | Disposition: A | Discharge status: Skilled Nursing Facility | Payer: Medicare Other | Attending: Emergency Medicine | Admitting: Emergency Medicine

## 2019-10-31 DIAGNOSIS — R4182 Altered mental status, unspecified: Secondary | ICD-10-CM | POA: Diagnosis not present

## 2019-10-31 DIAGNOSIS — F0390 Unspecified dementia without behavioral disturbance: Secondary | ICD-10-CM | POA: Diagnosis not present

## 2019-10-31 DIAGNOSIS — T50905A Adverse effect of unspecified drugs, medicaments and biological substances, initial encounter: Secondary | ICD-10-CM

## 2019-10-31 DIAGNOSIS — R2243 Localized swelling, mass and lump, lower limb, bilateral: Secondary | ICD-10-CM | POA: Diagnosis not present

## 2019-10-31 DIAGNOSIS — Z79899 Other long term (current) drug therapy: Secondary | ICD-10-CM | POA: Insufficient documentation

## 2019-10-31 DIAGNOSIS — R0789 Other chest pain: Secondary | ICD-10-CM | POA: Insufficient documentation

## 2019-10-31 DIAGNOSIS — E1122 Type 2 diabetes mellitus with diabetic chronic kidney disease: Secondary | ICD-10-CM | POA: Diagnosis not present

## 2019-10-31 DIAGNOSIS — I129 Hypertensive chronic kidney disease with stage 1 through stage 4 chronic kidney disease, or unspecified chronic kidney disease: Secondary | ICD-10-CM | POA: Diagnosis not present

## 2019-10-31 DIAGNOSIS — R0989 Other specified symptoms and signs involving the circulatory and respiratory systems: Secondary | ICD-10-CM | POA: Diagnosis not present

## 2019-10-31 DIAGNOSIS — R001 Bradycardia, unspecified: Secondary | ICD-10-CM | POA: Insufficient documentation

## 2019-10-31 DIAGNOSIS — T424X5A Adverse effect of benzodiazepines, initial encounter: Secondary | ICD-10-CM | POA: Diagnosis not present

## 2019-10-31 DIAGNOSIS — Z7984 Long term (current) use of oral hypoglycemic drugs: Secondary | ICD-10-CM | POA: Diagnosis not present

## 2019-10-31 DIAGNOSIS — N189 Chronic kidney disease, unspecified: Secondary | ICD-10-CM | POA: Insufficient documentation

## 2019-10-31 LAB — MAGNESIUM: Magnesium: 2.4 mg/dL (ref 1.7–2.4)

## 2019-10-31 LAB — URINALYSIS, COMPLETE (UACMP) WITH MICROSCOPIC
Bilirubin Urine: NEGATIVE
Glucose, UA: NEGATIVE mg/dL
Hgb urine dipstick: NEGATIVE
Ketones, ur: NEGATIVE mg/dL
Nitrite: NEGATIVE
Protein, ur: NEGATIVE mg/dL
Specific Gravity, Urine: 1.009 (ref 1.005–1.030)
pH: 7 (ref 5.0–8.0)

## 2019-10-31 LAB — CBC WITH DIFFERENTIAL/PLATELET
Abs Immature Granulocytes: 0.01 10*3/uL (ref 0.00–0.07)
Basophils Absolute: 0 10*3/uL (ref 0.0–0.1)
Basophils Relative: 1 %
Eosinophils Absolute: 0.3 10*3/uL (ref 0.0–0.5)
Eosinophils Relative: 4 %
HCT: 36.4 % (ref 36.0–46.0)
Hemoglobin: 11.5 g/dL — ABNORMAL LOW (ref 12.0–15.0)
Immature Granulocytes: 0 %
Lymphocytes Relative: 46 %
Lymphs Abs: 2.6 10*3/uL (ref 0.7–4.0)
MCH: 27.5 pg (ref 26.0–34.0)
MCHC: 31.6 g/dL (ref 30.0–36.0)
MCV: 87.1 fL (ref 80.0–100.0)
Monocytes Absolute: 0.7 10*3/uL (ref 0.1–1.0)
Monocytes Relative: 13 %
Neutro Abs: 2.1 10*3/uL (ref 1.7–7.7)
Neutrophils Relative %: 36 %
Platelets: 193 10*3/uL (ref 150–400)
RBC: 4.18 MIL/uL (ref 3.87–5.11)
RDW: 15.9 % — ABNORMAL HIGH (ref 11.5–15.5)
WBC: 5.7 10*3/uL (ref 4.0–10.5)
nRBC: 0 % (ref 0.0–0.2)

## 2019-10-31 LAB — COMPREHENSIVE METABOLIC PANEL
ALT: 15 U/L (ref 0–44)
AST: 21 U/L (ref 15–41)
Albumin: 3.8 g/dL (ref 3.5–5.0)
Alkaline Phosphatase: 102 U/L (ref 38–126)
Anion gap: 9 (ref 5–15)
BUN: 15 mg/dL (ref 8–23)
CO2: 26 mmol/L (ref 22–32)
Calcium: 9 mg/dL (ref 8.9–10.3)
Chloride: 102 mmol/L (ref 98–111)
Creatinine, Ser: 1.02 mg/dL — ABNORMAL HIGH (ref 0.44–1.00)
GFR calc Af Amer: >60 mL/min (ref >60)
GFR calc non Af Amer: 53 mL/min — ABNORMAL LOW (ref >60)
Glucose, Bld: 86 mg/dL (ref 70–99)
Potassium: 4.3 mmol/L (ref 3.5–5.1)
Sodium: 137 mmol/L (ref 135–145)
Total Bilirubin: 1.1 mg/dL (ref 0.3–1.2)
Total Protein: 7.3 g/dL (ref 6.5–8.1)

## 2019-10-31 LAB — TSH: TSH: 5.896 u[IU]/mL — ABNORMAL HIGH (ref 0.350–4.500)

## 2019-10-31 LAB — TROPONIN I (HIGH SENSITIVITY): Troponin I (High Sensitivity): 9 ng/L (ref <18)

## 2019-10-31 LAB — BRAIN NATRIURETIC PEPTIDE: B Natriuretic Peptide: 62 pg/mL (ref 0.0–100.0)

## 2019-10-31 MED ORDER — FUROSEMIDE 10 MG/ML IJ SOLN
40.0000 mg | Freq: Once | INTRAMUSCULAR | Status: AC
  Administered 2019-10-31: 40 mg via INTRAVENOUS
  Filled 2019-10-31: qty 4

## 2019-10-31 NOTE — Discharge Instructions (Addendum)
Please DO NOT give Ms. Caudle any ativan, as this likely caused her confusion.  Her heart rate runs in the 45-55 range per our records. As long as her Blood pressure is normal (>100/80) and she is otherwise well, this should be okay. Have her PCP follow-up.  HOLD ALL MEDICATIONS THAT COULD LOWER BLOOD PRESSURE. IN PARTICULAR, DO NOT GIVE ANY METOPROLOL, ATENOLOL, DILTIAZEM, OR OTHER BETA BLOCKERS OR CALCIUM BLOCKERS

## 2019-10-31 NOTE — ED Provider Notes (Signed)
[facility] Emergency Department Provider Note  ____________________________________________   First MD Initiated Contact with Patient 10/31/19 1224     (approximate)  I have reviewed the triage vital signs and the nursing notes.   HISTORY  Chief Complaint Altered Mental Status    HPI Melanie Young is a 79 y.o. female  With PMHx dementia, DM, HTN, HLD, dementia, here with reported bradycardia. Per report from the facility, pt was found to be less responsive than usual and more confused. She was noted to be bradycardic to the 40s so EMS was called. EMS was told pt was on metoprolol but per [facility], is not. On arrival here, pt states her legs hurt but they have hurt "for a while." Denies any other complaints, though history limited 2/2 dementia. She does point to her chest as an area of pain on repeat questioning.  Level 5 caveat invoked as remainder of history, ROS, and physical exam limited due to patient's AMS, dementia        Past Medical History:  Diagnosis Date  . Breast cancer ([place]) 1980's   right breast ca with mastectomy  . Chronic kidney disease   . Dementia ([place])   . Depression   . Diabetes mellitus without complication ([place])   . Hypercholesteremia   . Hypertension   . Hypothyroidism   . Osteoporosis     Patient Active Problem List   Diagnosis Date Noted  . Sinus bradycardia 09/10/2019  . Fall   . Dizziness   . Urinary tract infection in elderly patient   . AKI (acute kidney injury) ([place]) 08/10/2017    Past Surgical History:  Procedure Laterality Date  . BREAST BIOPSY Left 2013   core needle bx, benign  . BREAST SURGERY    . CESAREAN SECTION    . COLONOSCOPY WITH PROPOFOL N/A 08/31/2018   Procedure: COLONOSCOPY WITH PROPOFOL;  Surgeon: Toledo, Benay Pike, MD;  Location: [facility] ENDOSCOPY;  Service: Endoscopy;  Laterality: N/A;  . MASTECTOMY Right 1973   breast ca  . THYROIDECTOMY      Prior to Admission medications   Medication  Sig Start Date End Date Taking? Authorizing Provider  acetaminophen (TYLENOL) 325 MG tablet Take 650 mg by mouth every 4 (four) hours as needed for mild pain.   Yes [provider]  acetaminophen (TYLENOL) 650 MG CR tablet Take 650 mg by mouth 3 (three) times daily.   Yes [provider]  alum & mag hydroxide-simeth (MAALOX/MYLANTA) 200-200-20 MG/5ML suspension Take 30 mLs by mouth every 6 (six) hours as needed for indigestion or heartburn.   Yes [provider]  ammonium lactate (LAC-HYDRIN) 12 % lotion Apply 1 application topically daily.   Yes [provider]  aspirin 81 MG tablet Take 1 tablet (81 mg total) by mouth daily. 09/12/19  Yes Lorella Nimrod, MD  atorvastatin (LIPITOR) 40 MG tablet Take 40 mg by mouth at bedtime.    Yes [provider]  busPIRone (BUSPAR) 10 MG tablet Take 10 mg by mouth 2 (two) times daily.    Yes [provider]  calcium carbonate (TUMS - DOSED IN MG ELEMENTAL CALCIUM) 500 MG chewable tablet Chew 1 tablet by mouth 2 (two) times daily.   Yes [provider]  cholecalciferol (VITAMIN D3) 25 MCG (1000 UNIT) tablet Take 1,000 Units by mouth daily.   Yes [provider]  diclofenac Sodium (VOLTAREN) 1 % GEL Apply 2 g topically 2 (two) times daily.   Yes [provider]  guaifenesin (ROBITUSSIN) 100 MG/5ML syrup Take 200 mg by mouth every 6 (six) hours as needed for cough.   Yes [provider]  lamoTRIgine (LAMICTAL) 200 MG tablet Take 200 mg by mouth daily.    Yes [provider]  levothyroxine (SYNTHROID) 175 MCG tablet Take 1 tablet (175 mcg total) by mouth daily. 09/13/19  Yes Lorella Nimrod, MD  linagliptin (TRADJENTA) 5 MG TABS tablet Take 5 mg by mouth daily.   Yes [provider]  loperamide (IMODIUM) 2 MG capsule Take 2 mg by mouth 4 (four) times daily as needed for diarrhea or loose stools.    Yes [provider]  losartan (COZAAR) 50 MG tablet Take 50  mg by mouth daily. 07/25/17  Yes [provider]  LYRICA 100 MG capsule Take 100 mg by mouth 2 (two) times daily.   Yes [provider]  magnesium hydroxide (MILK OF MAGNESIA) 400 MG/5ML suspension Take 30 mLs by mouth at bedtime as needed for mild constipation or moderate constipation.   Yes [provider]  metFORMIN (GLUCOPHAGE) 500 MG tablet Take 750 mg by mouth daily.    Yes [provider]  neomycin-bacitracin-polymyxin (NEOSPORIN) ointment Apply 1 application topically as needed for wound care.   Yes [provider]  nystatin (NYSTATIN) powder Apply 1 application topically 2 (two) times daily.   Yes [provider]  omeprazole (PRILOSEC) 20 MG capsule Take 20 mg by mouth daily.    Yes [provider]  ondansetron (ZOFRAN) 4 MG tablet Take 4 mg by mouth every 6 (six) hours as needed for nausea or vomiting.   Yes [provider]  oxybutynin (DITROPAN) 5 MG tablet Take 2.5 mg by mouth 2 (two) times daily.    Yes [provider]  oxyCODONE (OXY IR/ROXICODONE) 5 MG immediate release tablet Take 5 mg by mouth 2 (two) times daily.   Yes [provider]  PROAIR HFA 108 (90 Base) MCG/ACT inhaler Inhale 1 puff into the lungs every 6 (six) hours as needed for wheezing or shortness of breath.    Yes [provider]  QUEtiapine (SEROQUEL) 50 MG tablet Take 25 mg by mouth daily.   Yes [provider]  torsemide (DEMADEX) 20 MG tablet Take 20 mg by mouth daily.   Yes [provider]  trolamine salicylate (ASPERCREME) 10 % cream Apply 1 application topically 3 (three) times daily as needed for muscle pain.    Yes [provider]    Allergies Patient has no known allergies.  Family History  Problem Relation Age of Onset  . Breast cancer Maternal Aunt   . Breast cancer Cousin        3 maternal cousins, 63's    Social History Social History   Tobacco Use  . Smoking status:  Never Smoker  . Smokeless tobacco: Never Used  Substance Use Topics  . Alcohol use: Not Currently  . Drug use: Never    Review of Systems  Review of Systems  Unable to perform ROS: Dementia     ____________________________________________  PHYSICAL EXAM:      VITAL SIGNS: ED Triage Vitals  Enc Vitals Group     BP 10/31/19 1221 140/72     Pulse --      Resp --      Temp 10/31/19 1220 (!) 97.5 F (36.4 C)     Temp Source 10/31/19 1220 Oral     SpO2 --      Weight  10/31/19 1222 233 lb 4 oz (105.8 kg)     Height 10/31/19 1222 5\' 1"  (1.549 m)     Head Circumference --      [facility] Flow --      Pain Score 10/31/19 1221 Asleep     Pain Loc --      Pain Edu? --      Excl. in [place]? --      Physical Exam Vitals and nursing note reviewed.  Constitutional:      General: She is not in acute distress.    Appearance: She is well-developed.     Comments: Drowsy but will awake to questioning. Mildly confused.  HENT:     Head: Normocephalic and atraumatic.     Mouth/Throat:     Mouth: Mucous membranes are dry.  Eyes:     Conjunctiva/sclera: Conjunctivae normal.  Cardiovascular:     Rate and Rhythm: Regular rhythm. Bradycardia present.     Heart sounds: Normal heart sounds. No murmur. No friction rub.  Pulmonary:     Effort: Pulmonary effort is normal. No respiratory distress.     Breath sounds: Rales present. No wheezing.  Abdominal:     General: There is no distension.     Palpations: Abdomen is soft.     Tenderness: There is no abdominal tenderness.  Musculoskeletal:     Cervical back: Neck supple.     Right lower leg: Edema (3+) present.     Left lower leg: Edema (3+) present.  Skin:    General: Skin is warm.     Capillary Refill: Capillary refill takes less than 2 seconds.  Neurological:     Mental Status: She is easily aroused.     Motor: No abnormal muscle tone.     Comments: Oriented to person only, not place or time. MAE. Face appears symmetric. PERRL.        ____________________________________________   LABS (all labs ordered are listed, but only abnormal results are displayed)  Labs Reviewed  CBC WITH DIFFERENTIAL/PLATELET - Abnormal; Notable for the following components:      Result Value   Hemoglobin 11.5 (*)    RDW 15.9 (*)    All other components within normal limits  BLOOD GAS, VENOUS - Abnormal; Notable for the following components:   Bicarbonate 29.8 (*)    Acid-Base Excess 2.9 (*)    All other components within normal limits  COMPREHENSIVE METABOLIC PANEL - Abnormal; Notable for the following components:   Creatinine, Ser 1.02 (*)    GFR calc non Af Amer 53 (*)    All other components within normal limits  TSH - Abnormal; Notable for the following components:   TSH 5.896 (*)    All other components within normal limits  URINALYSIS, COMPLETE (UACMP) WITH MICROSCOPIC - Abnormal; Notable for the following components:   Color, Urine YELLOW (*)    APPearance HAZY (*)    Leukocytes,Ua LARGE (*)    Bacteria, UA RARE (*)    All other components within normal limits  BRAIN NATRIURETIC PEPTIDE  MAGNESIUM  TROPONIN I (HIGH SENSITIVITY)  TROPONIN I (HIGH SENSITIVITY)    ____________________________________________  EKG: Normal sinus rhythm, VR 65. PR 128, QRS 116, QTc 453. No acute ST elevations or depressions. No ischemia or infarct. ________________________________________  RADIOLOGY All imaging, including plain films, CT scans, and ultrasounds, independently reviewed by me, and interpretations confirmed via formal radiology reads.  ED MD interpretation:   CXR: Mild pulmonary vascular congestion CT Head: NAICA  Official radiology report(s): CT Head Wo Contrast  Result Date: 10/31/2019 CLINICAL DATA:  Altered mental status, bradycardia EXAM: CT HEAD WITHOUT CONTRAST TECHNIQUE: Contiguous axial images were obtained from the base of the skull through the vertex without intravenous contrast. COMPARISON:  09/10/2019  FINDINGS: Brain: No evidence of acute infarction, hemorrhage, hydrocephalus, extra-axial collection or mass lesion/mass effect. Vascular: Atherosclerotic calcifications involving the large vessels of the skull base. No unexpected hyperdense vessel. Skull: Normal. Negative for fracture or focal lesion. Sinuses/Orbits: No acute finding. Other: None. IMPRESSION: No acute intracranial findings. Electronically Signed   By: Davina Poke D.O.   On: 10/31/2019 13:28   DG Chest Portable 1 View  Result Date: 10/31/2019 CLINICAL DATA:  Chest pain EXAM: PORTABLE CHEST 1 VIEW COMPARISON:  07/13/2019 FINDINGS: Bilateral interstitial thickening. No pleural effusion or pneumothorax. Stable cardiomediastinal silhouette. No aggressive osseous lesion. 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 IMPRESSION: Mild pulmonary vascular congestion. Electronically Signed   By: Kathreen Devoid   On: 10/31/2019 13:16    ____________________________________________  PROCEDURES   Procedure(s) performed (including Critical Care):  .1-3 Lead EKG Interpretation Performed by: Duffy Bruce, MD Authorized by: Duffy Bruce, MD     Interpretation: abnormal     ECG rate:  45-65   ECG rate assessment: bradycardic     Rhythm: sinus bradycardia     Ectopy: PVCs     Conduction: normal   Comments:     Indication: Bradycardia noted at facility, h/o heart disease    ____________________________________________  INITIAL IMPRESSION / MDM / ASSESSMENT AND PLAN / ED COURSE  As part of my medical decision making, I reviewed the following data within the [place] notes reviewed and incorporated, Old chart reviewed, Notes from prior ED visits, and [place] Controlled Substance Database       *Ayzel Stigall was evaluated in Emergency Department on 10/31/2019 for the symptoms described in the history of present illness. She was evaluated in the context of the global COVID-19  pandemic, which necessitated consideration that the patient might be at risk for infection with the SARS-CoV-2 virus that causes COVID-19. Institutional protocols and algorithms that pertain to the evaluation of patients at risk for COVID-19 are in a state of rapid change based on information released by regulatory bodies including the CDC and federal and state organizations. These policies and algorithms were followed during the patient's care in the ED.  Some ED evaluations and interventions may be delayed as a result of limited staffing during the pandemic.*     Medical Decision Making:  79 yo F here with reported AMS. This began after receiving ativan at facility. Pt also w/ reported bradycardia.  Reviewed pt's prior visits and hospitalizations - she has baseline sinus bradycardia in 45-55 range and her BP have been normal here. No ectopy or profound or prolonged bradycardia on >4 hr monitoring here. Lytes acceptable. Trop negative. Will have facility stop all possible beta blocker/nodal slowing agents and refer for outpt follow-up.   Re: AMS. CXR with ? Mild edema but sats well - lasix given. Otherwise, UA without pyuria or signs of UTI (lkely mild contaminant). WBC normal. VBG unremarkable. CXR without pneumonia. Likely 2/2 polypharmacy and ativan use. Hold ativan.  ____________________________________________  FINAL CLINICAL IMPRESSION(S) / ED DIAGNOSES  Final diagnoses:  Adverse effect of drug, initial encounter     MEDICATIONS GIVEN DURING THIS VISIT:  Medications  furosemide (LASIX) injection 40 mg (40 mg Intravenous Given 10/31/19 1513)  ED Discharge Orders    None       Note:  This document was prepared using Dragon voice recognition software and may include unintentional dictation errors.   Duffy Bruce, MD 10/31/19 [phone]

## 2019-10-31 NOTE — ED Notes (Signed)
Pt ok to eat per EDP. Pt given meal tray and beverage.

## 2019-10-31 NOTE — ED Triage Notes (Signed)
Pt arrived via [facility] from [facility] with AMS and bradycardia. Staff at facility reports that pt HR was in the low 40's and she was not responsive as usual. Pt was also given ativan earlier today for "mental disturbance". Pt responds to pain.

## 2019-10-31 NOTE — ED Notes (Signed)
[facility]  CALLED  FOR  TRANSPORT   TO  [place]  HOUSE 

## 2019-11-03 ENCOUNTER — Inpatient Hospital Stay
Admission: EM | Admit: 2019-11-03 | Discharge: 2019-11-23 | DRG: 683 | Disposition: A | Discharge status: Home / Self Care | Payer: Medicare Other | Source: Skilled Nursing Facility | Attending: Internal Medicine | Admitting: Internal Medicine

## 2019-11-03 ENCOUNTER — Encounter: Payer: Self-pay | Admitting: Emergency Medicine

## 2019-11-03 ENCOUNTER — Other Ambulatory Visit: Payer: Self-pay

## 2019-11-03 ENCOUNTER — Emergency Department: Payer: Medicare Other

## 2019-11-03 DIAGNOSIS — R001 Bradycardia, unspecified: Secondary | ICD-10-CM | POA: Diagnosis present

## 2019-11-03 DIAGNOSIS — F411 Generalized anxiety disorder: Secondary | ICD-10-CM | POA: Diagnosis present

## 2019-11-03 DIAGNOSIS — I10 Essential (primary) hypertension: Secondary | ICD-10-CM

## 2019-11-03 DIAGNOSIS — F332 Major depressive disorder, recurrent severe without psychotic features: Secondary | ICD-10-CM | POA: Diagnosis present

## 2019-11-03 DIAGNOSIS — N183 Chronic kidney disease, stage 3 unspecified: Secondary | ICD-10-CM

## 2019-11-03 DIAGNOSIS — I959 Hypotension, unspecified: Secondary | ICD-10-CM

## 2019-11-03 DIAGNOSIS — E890 Postprocedural hypothyroidism: Secondary | ICD-10-CM

## 2019-11-03 DIAGNOSIS — Z9011 Acquired absence of right breast and nipple: Secondary | ICD-10-CM

## 2019-11-03 DIAGNOSIS — F0151 Vascular dementia with behavioral disturbance: Secondary | ICD-10-CM | POA: Diagnosis present

## 2019-11-03 DIAGNOSIS — Z79899 Other long term (current) drug therapy: Secondary | ICD-10-CM

## 2019-11-03 DIAGNOSIS — R45851 Suicidal ideations: Secondary | ICD-10-CM | POA: Diagnosis present

## 2019-11-03 DIAGNOSIS — F03918 Unspecified dementia, unspecified severity, with other behavioral disturbance: Secondary | ICD-10-CM | POA: Diagnosis present

## 2019-11-03 DIAGNOSIS — Z6841 Body Mass Index (BMI) 40.0 and over, adult: Secondary | ICD-10-CM

## 2019-11-03 DIAGNOSIS — N1831 Chronic kidney disease, stage 3a: Secondary | ICD-10-CM | POA: Diagnosis present

## 2019-11-03 DIAGNOSIS — D696 Thrombocytopenia, unspecified: Secondary | ICD-10-CM

## 2019-11-03 DIAGNOSIS — R600 Localized edema: Secondary | ICD-10-CM | POA: Diagnosis present

## 2019-11-03 DIAGNOSIS — I129 Hypertensive chronic kidney disease with stage 1 through stage 4 chronic kidney disease, or unspecified chronic kidney disease: Secondary | ICD-10-CM | POA: Diagnosis present

## 2019-11-03 DIAGNOSIS — Z751 Person awaiting admission to adequate facility elsewhere: Secondary | ICD-10-CM

## 2019-11-03 DIAGNOSIS — E860 Dehydration: Secondary | ICD-10-CM | POA: Diagnosis present

## 2019-11-03 DIAGNOSIS — F419 Anxiety disorder, unspecified: Secondary | ICD-10-CM | POA: Diagnosis present

## 2019-11-03 DIAGNOSIS — F0391 Unspecified dementia with behavioral disturbance: Secondary | ICD-10-CM

## 2019-11-03 DIAGNOSIS — R609 Edema, unspecified: Secondary | ICD-10-CM

## 2019-11-03 DIAGNOSIS — Z79890 Hormone replacement therapy (postmenopausal): Secondary | ICD-10-CM

## 2019-11-03 DIAGNOSIS — Z79891 Long term (current) use of opiate analgesic: Secondary | ICD-10-CM

## 2019-11-03 DIAGNOSIS — Z7984 Long term (current) use of oral hypoglycemic drugs: Secondary | ICD-10-CM

## 2019-11-03 DIAGNOSIS — N179 Acute kidney failure, unspecified: Secondary | ICD-10-CM | POA: Diagnosis present

## 2019-11-03 DIAGNOSIS — F331 Major depressive disorder, recurrent, moderate: Secondary | ICD-10-CM | POA: Diagnosis present

## 2019-11-03 DIAGNOSIS — M170 Bilateral primary osteoarthritis of knee: Secondary | ICD-10-CM | POA: Diagnosis present

## 2019-11-03 DIAGNOSIS — M7120 Synovial cyst of popliteal space [Baker], unspecified knee: Secondary | ICD-10-CM | POA: Diagnosis present

## 2019-11-03 DIAGNOSIS — Z7982 Long term (current) use of aspirin: Secondary | ICD-10-CM

## 2019-11-03 DIAGNOSIS — I952 Hypotension due to drugs: Secondary | ICD-10-CM | POA: Diagnosis present

## 2019-11-03 DIAGNOSIS — E7800 Pure hypercholesterolemia, unspecified: Secondary | ICD-10-CM | POA: Diagnosis present

## 2019-11-03 DIAGNOSIS — Z803 Family history of malignant neoplasm of breast: Secondary | ICD-10-CM

## 2019-11-03 DIAGNOSIS — Z20822 Contact with and (suspected) exposure to covid-19: Secondary | ICD-10-CM | POA: Diagnosis present

## 2019-11-03 DIAGNOSIS — Z853 Personal history of malignant neoplasm of breast: Secondary | ICD-10-CM

## 2019-11-03 DIAGNOSIS — E785 Hyperlipidemia, unspecified: Secondary | ICD-10-CM | POA: Diagnosis present

## 2019-11-03 DIAGNOSIS — Z915 Personal history of self-harm: Secondary | ICD-10-CM

## 2019-11-03 DIAGNOSIS — E1122 Type 2 diabetes mellitus with diabetic chronic kidney disease: Secondary | ICD-10-CM | POA: Diagnosis present

## 2019-11-03 DIAGNOSIS — E1169 Type 2 diabetes mellitus with other specified complication: Secondary | ICD-10-CM

## 2019-11-03 DIAGNOSIS — R0789 Other chest pain: Secondary | ICD-10-CM | POA: Diagnosis present

## 2019-11-03 DIAGNOSIS — E669 Obesity, unspecified: Secondary | ICD-10-CM | POA: Diagnosis present

## 2019-11-03 DIAGNOSIS — M810 Age-related osteoporosis without current pathological fracture: Secondary | ICD-10-CM | POA: Diagnosis present

## 2019-11-03 DIAGNOSIS — T465X5A Adverse effect of other antihypertensive drugs, initial encounter: Secondary | ICD-10-CM | POA: Diagnosis present

## 2019-11-03 LAB — CBC WITH DIFFERENTIAL/PLATELET
Abs Immature Granulocytes: 0.01 10*3/uL (ref 0.00–0.07)
Basophils Absolute: 0 10*3/uL (ref 0.0–0.1)
Basophils Relative: 1 %
Eosinophils Absolute: 0.2 10*3/uL (ref 0.0–0.5)
Eosinophils Relative: 3 %
HCT: 37.7 % (ref 36.0–46.0)
Hemoglobin: 11.9 g/dL — ABNORMAL LOW (ref 12.0–15.0)
Immature Granulocytes: 0 %
Lymphocytes Relative: 44 %
Lymphs Abs: 2.5 10*3/uL (ref 0.7–4.0)
MCH: 27.5 pg (ref 26.0–34.0)
MCHC: 31.6 g/dL (ref 30.0–36.0)
MCV: 87.3 fL (ref 80.0–100.0)
Monocytes Absolute: 0.6 10*3/uL (ref 0.1–1.0)
Monocytes Relative: 11 %
Neutro Abs: 2.3 10*3/uL (ref 1.7–7.7)
Neutrophils Relative %: 41 %
Platelets: 101 10*3/uL — ABNORMAL LOW (ref 150–400)
RBC: 4.32 MIL/uL (ref 3.87–5.11)
RDW: 15.6 % — ABNORMAL HIGH (ref 11.5–15.5)
WBC: 5.6 10*3/uL (ref 4.0–10.5)
nRBC: 0 % (ref 0.0–0.2)

## 2019-11-03 LAB — BASIC METABOLIC PANEL
Anion gap: 10 (ref 5–15)
BUN: 19 mg/dL (ref 8–23)
CO2: 22 mmol/L (ref 22–32)
Calcium: 9.2 mg/dL (ref 8.9–10.3)
Chloride: 105 mmol/L (ref 98–111)
Creatinine, Ser: 0.98 mg/dL (ref 0.44–1.00)
GFR calc Af Amer: >60 mL/min (ref >60)
GFR calc non Af Amer: 56 mL/min — ABNORMAL LOW (ref >60)
Glucose, Bld: 114 mg/dL — ABNORMAL HIGH (ref 70–99)
Potassium: 4.1 mmol/L (ref 3.5–5.1)
Sodium: 137 mmol/L (ref 135–145)

## 2019-11-03 LAB — RESPIRATORY PANEL BY RT PCR (FLU A&B, COVID)
Influenza A by PCR: NEGATIVE
Influenza B by PCR: NEGATIVE
SARS Coronavirus 2 by RT PCR: NEGATIVE

## 2019-11-03 LAB — TROPONIN I (HIGH SENSITIVITY): Troponin I (High Sensitivity): 12 ng/L (ref <18)

## 2019-11-03 MED ORDER — DROPERIDOL 2.5 MG/ML IJ SOLN
5.0000 mg | Freq: Once | INTRAMUSCULAR | Status: AC
  Administered 2019-11-03: 5 mg via INTRAMUSCULAR

## 2019-11-03 MED ORDER — QUETIAPINE FUMARATE 25 MG PO TABS
25.0000 mg | ORAL_TABLET | Freq: Once | ORAL | Status: DC
  Filled 2019-11-03: qty 1

## 2019-11-03 MED ORDER — FUROSEMIDE 10 MG/ML IJ SOLN
40.0000 mg | Freq: Once | INTRAMUSCULAR | Status: DC
  Filled 2019-11-03: qty 4

## 2019-11-03 MED ORDER — LEVOTHYROXINE SODIUM 50 MCG PO TABS
175.0000 ug | ORAL_TABLET | Freq: Every day | ORAL | Status: DC
  Administered 2019-11-04 – 2019-11-05 (×2): 175 ug via ORAL
  Filled 2019-11-03 (×2): qty 4

## 2019-11-03 MED ORDER — PREGABALIN 50 MG PO CAPS
100.0000 mg | ORAL_CAPSULE | Freq: Two times a day (BID) | ORAL | Status: DC
  Administered 2019-11-04 – 2019-11-06 (×6): 100 mg via ORAL
  Filled 2019-11-03 (×6): qty 2

## 2019-11-03 MED ORDER — METFORMIN HCL 500 MG PO TABS
750.0000 mg | ORAL_TABLET | Freq: Every day | ORAL | Status: DC
  Administered 2019-11-04 – 2019-11-06 (×3): 750 mg via ORAL
  Filled 2019-11-03 (×3): qty 2

## 2019-11-03 MED ORDER — BUSPIRONE HCL 10 MG PO TABS
10.0000 mg | ORAL_TABLET | Freq: Two times a day (BID) | ORAL | Status: DC
  Administered 2019-11-04 – 2019-11-18 (×29): 10 mg via ORAL
  Filled 2019-11-03 (×4): qty 1
  Filled 2019-11-03: qty 2
  Filled 2019-11-03: qty 1
  Filled 2019-11-03: qty 2
  Filled 2019-11-03 (×3): qty 1
  Filled 2019-11-03: qty 2
  Filled 2019-11-03 (×8): qty 1
  Filled 2019-11-03: qty 2
  Filled 2019-11-03 (×3): qty 1
  Filled 2019-11-03: qty 2
  Filled 2019-11-03 (×2): qty 1
  Filled 2019-11-03: qty 2
  Filled 2019-11-03 (×4): qty 1

## 2019-11-03 MED ORDER — PANTOPRAZOLE SODIUM 40 MG PO TBEC
40.0000 mg | DELAYED_RELEASE_TABLET | Freq: Every day | ORAL | Status: DC
  Administered 2019-11-04 – 2019-11-23 (×20): 40 mg via ORAL
  Filled 2019-11-03 (×20): qty 1

## 2019-11-03 MED ORDER — LAMOTRIGINE 100 MG PO TABS
200.0000 mg | ORAL_TABLET | Freq: Every day | ORAL | Status: DC
  Administered 2019-11-04 – 2019-11-23 (×20): 200 mg via ORAL
  Filled 2019-11-03 (×20): qty 2

## 2019-11-03 MED ORDER — LOSARTAN POTASSIUM 50 MG PO TABS: 50.0000 mg | ORAL_TABLET | Freq: Once | ORAL | Status: DC

## 2019-11-03 MED ORDER — ATORVASTATIN CALCIUM 20 MG PO TABS
40.0000 mg | ORAL_TABLET | Freq: Every day | ORAL | Status: DC
  Administered 2019-11-04 – 2019-11-22 (×19): 40 mg via ORAL
  Filled 2019-11-03 (×22): qty 2

## 2019-11-03 MED ORDER — LOSARTAN POTASSIUM 50 MG PO TABS
50.0000 mg | ORAL_TABLET | Freq: Every day | ORAL | Status: DC
  Administered 2019-11-04 – 2019-11-06 (×3): 50 mg via ORAL
  Filled 2019-11-03 (×3): qty 1

## 2019-11-03 MED ORDER — DROPERIDOL 2.5 MG/ML IJ SOLN
5.0000 mg | Freq: Once | INTRAMUSCULAR | Status: AC
  Administered 2019-11-03: 14:00:00 5 mg via INTRAMUSCULAR

## 2019-11-03 MED ORDER — QUETIAPINE FUMARATE 25 MG PO TABS: 25.0000 mg | ORAL_TABLET | Freq: Two times a day (BID) | ORAL | 0 refills | Status: DC

## 2019-11-03 MED ORDER — QUETIAPINE FUMARATE 25 MG PO TABS
25.0000 mg | ORAL_TABLET | Freq: Two times a day (BID) | ORAL | Status: DC
  Administered 2019-11-04 – 2019-11-23 (×38): 25 mg via ORAL
  Filled 2019-11-03 (×42): qty 1

## 2019-11-03 MED ORDER — OXYBUTYNIN CHLORIDE 5 MG PO TABS
2.5000 mg | ORAL_TABLET | Freq: Two times a day (BID) | ORAL | Status: DC
  Administered 2019-11-04 – 2019-11-06 (×6): 2.5 mg via ORAL
  Filled 2019-11-03 (×9): qty 0.5

## 2019-11-03 MED ORDER — ASPIRIN EC 81 MG PO TBEC
81.0000 mg | DELAYED_RELEASE_TABLET | Freq: Every day | ORAL | Status: DC
  Administered 2019-11-04 – 2019-11-23 (×20): 81 mg via ORAL
  Filled 2019-11-03 (×20): qty 1

## 2019-11-03 MED ORDER — LINAGLIPTIN 5 MG PO TABS
5.0000 mg | ORAL_TABLET | Freq: Every day | ORAL | Status: DC
  Administered 2019-11-04 – 2019-11-06 (×3): 5 mg via ORAL
  Filled 2019-11-03 (×4): qty 1

## 2019-11-03 MED ORDER — TORSEMIDE 20 MG PO TABS
20.0000 mg | ORAL_TABLET | Freq: Every day | ORAL | Status: DC
  Administered 2019-11-04 – 2019-11-06 (×3): 20 mg via ORAL
  Filled 2019-11-03 (×4): qty 1

## 2019-11-03 NOTE — ED Notes (Signed)
EDP aware of patient's BP. Call bell remains within reach at this time. This RN explained would attempt to call patient's sister again, pt states "just don't call nobody I'm by myself and that's how I'm going to stay just all by myself". Pt denies any needs at this time, continues to rest in bed, remains calm and cooperative, awaiting on psych consult at this time.

## 2019-11-03 NOTE — ED Notes (Signed)
Awaiting geri-psych placement for pt.

## 2019-11-03 NOTE — ED Notes (Signed)
Pt transported to Xray at this time.

## 2019-11-03 NOTE — ED Notes (Signed)
This RN to bedside at this time. Repositioned BP cuff. This RN offered to reposition patient for comfort. Pt refused stating she is comfortable. Pt remains calm and cooperative at this time, denies any needs. Resting in bed with eyes closed, respirations even and unlabored.

## 2019-11-03 NOTE — Consult Note (Signed)
[facility] Consult   Reason for Consult: Aggressive behavior in the group home Referring Physician: Dr. Charna Archer Patient Identification: Melanie Young MRN:  [id number] Principal Diagnosis: <principal problem not specified> Diagnosis:  Active Problems:   * No active hospital problems. *   Total Time spent with patient: 30 minutes  Subjective:   Melanie Young is a 79 y.o. female patient who presented with chest pain and aggressive behavior in the group home.  HPI: Patient was seen in the emergency department for chest pain and aggressive behavior in the group home.  Initially patient was calm and cooperative and expressed her dismay for her current living situation.  However upon finding that she would be sent back to her nursing home, patient became extremely aggressive and threw food at staff as well as put a bag over her head.  Patient medications were attempted to be adjusted however she was unable to to be calmed down.  IM droperidol had to be administered.  Even after being administered medication patient was still somewhat hostile and then refused to speak to Probation officer.  Past Psychiatric History: Patient reports history of "nervous breakdown"  Risk to Self:  Yes Risk to Others:  Yes Prior Inpatient Therapy:  Yes Prior Outpatient Therapy:  Yes  Past Medical History:  Past Medical History:  Diagnosis Date  . Breast cancer ([place]) 1980's   right breast ca with mastectomy  . Chronic kidney disease   . Dementia ([place])   . Depression   . Diabetes mellitus without complication ([place])   . Hypercholesteremia   . Hypertension   . Hypothyroidism   . Osteoporosis     Past Surgical History:  Procedure Laterality Date  . BREAST BIOPSY Left 2013   core needle bx, benign  . BREAST SURGERY    . CESAREAN SECTION    . COLONOSCOPY WITH PROPOFOL N/A 08/31/2018   Procedure: COLONOSCOPY WITH PROPOFOL;  Surgeon: Toledo, Benay Pike, MD;  Location: [facility] ENDOSCOPY;  Service:  Endoscopy;  Laterality: N/A;  . MASTECTOMY Right 1973   breast ca  . THYROIDECTOMY     Family History:  Family History  Problem Relation Age of Onset  . Breast cancer Maternal Aunt   . Breast cancer Cousin        3 maternal cousins, 37's   Family Psychiatric  History: Patient denies Social History:  Social History   Substance and Sexual Activity  Alcohol Use Not Currently     Social History   Substance and Sexual Activity  Drug Use Never    Social History   Socioeconomic History  . Marital status: Single    Spouse name: Not on file  . Number of children: Not on file  . Years of education: Not on file  . Highest education level: Not on file  Occupational History  . Not on file  Tobacco Use  . Smoking status: Never Smoker  . Smokeless tobacco: Never Used  Substance and Sexual Activity  . Alcohol use: Not Currently  . Drug use: Never  . Sexual activity: Not on file  Other Topics Concern  . Not on file  Social History Narrative  . Not on file   Social Determinants of Health   Financial Resource Strain:   . Difficulty of Paying Living Expenses:   Food Insecurity:   . Worried About Charity fundraiser in the Last Year:   . [place] in the Last Year:   Transportation Needs:   . Lack of  Transportation (Medical):   Marland Kitchen Lack of Transportation (Non-Medical):   Physical Activity:   . Days of Exercise per Week:   . Minutes of Exercise per Session:   Stress:   . Feeling of Stress :   Social Connections:   . Frequency of Communication with Friends and Family:   . Frequency of Social Gatherings with Friends and Family:   . Attends Religious Services:   . Active Member of Clubs or Organizations:   . Attends Archivist Meetings:   Marland Kitchen Marital Status:    Additional Social History:    Allergies:  No Known Allergies  Labs:  Results for orders placed or performed during the hospital encounter of 11/03/19 (from the past 48 hour(s))  Basic metabolic  panel     Status: Abnormal   Collection Time: 11/03/19  8:02 AM  Result Value Ref Range   Sodium 137 135 - 145 mmol/L   Potassium 4.1 3.5 - 5.1 mmol/L   Chloride 105 98 - 111 mmol/L   CO2 22 22 - 32 mmol/L   Glucose, Bld 114 (H) 70 - 99 mg/dL    Comment: Glucose reference range applies only to samples taken after fasting for at least 8 hours.   BUN 19 8 - 23 mg/dL   Creatinine, Ser 0.98 0.44 - 1.00 mg/dL   Calcium 9.2 8.9 - 10.3 mg/dL   GFR calc non Af Amer 56 (L) >60 mL/min   GFR calc Af Amer >60 >60 mL/min   Anion gap 10 5 - 15    Comment: Performed at [facility], [address] [place], [place] [address]  CBC with Differential     Status: Abnormal   Collection Time: 11/03/19  8:02 AM  Result Value Ref Range   WBC 5.6 4.0 - 10.5 K/uL   RBC 4.32 3.87 - 5.11 MIL/uL   Hemoglobin 11.9 (L) 12.0 - 15.0 g/dL   HCT 37.7 36.0 - 46.0 %   MCV 87.3 80.0 - 100.0 fL   MCH 27.5 26.0 - 34.0 pg   MCHC 31.6 30.0 - 36.0 g/dL   RDW 15.6 (H) 11.5 - 15.5 %   Platelets 101 (L) 150 - 400 K/uL    Comment: PLATELET COUNT CONFIRMED BY SMEAR Immature Platelet Fraction may be clinically indicated, consider ordering this additional test [id number]    nRBC 0.0 0.0 - 0.2 %   Neutrophils Relative % 41 %   Neutro Abs 2.3 1.7 - 7.7 K/uL   Lymphocytes Relative 44 %   Lymphs Abs 2.5 0.7 - 4.0 K/uL   Monocytes Relative 11 %   Monocytes Absolute 0.6 0.1 - 1.0 K/uL   Eosinophils Relative 3 %   Eosinophils Absolute 0.2 0.0 - 0.5 K/uL   Basophils Relative 1 %   Basophils Absolute 0.0 0.0 - 0.1 K/uL   Immature Granulocytes 0 %   Abs Immature Granulocytes 0.01 0.00 - 0.07 K/uL    Comment: Performed at [facility], [address] [place], [place] [address]  Troponin I (High Sensitivity)     Status: None   Collection Time: 11/03/19  8:02 AM  Result Value Ref Range   Troponin I (High Sensitivity) 12 <18 ng/L    Comment: (NOTE) Elevated high sensitivity troponin I (hsTnI) values and  significant  changes across serial measurements may suggest ACS but many other  chronic and acute conditions are known to elevate hsTnI results.  Refer to the "Links" section for chest pain algorithms and additional  guidance. Performed at [facility], [address] [place], [place] [address]   Respiratory Panel by RT PCR (Flu A&B, Covid) - Nasopharyngeal Swab     Status: None   Collection Time: 11/03/19  3:24 PM   Specimen: Nasopharyngeal Swab  Result Value Ref Range   SARS Coronavirus 2 by RT PCR NEGATIVE NEGATIVE    Comment: (NOTE) SARS-CoV-2 target nucleic acids are NOT DETECTED. The SARS-CoV-2 RNA is generally detectable in upper respiratoy specimens during the acute phase of infection. The lowest concentration of SARS-CoV-2 viral copies this assay can detect is 131 copies/mL. A negative result does not preclude SARS-Cov-2 infection and should not be used as the sole basis for treatment or other patient management decisions. A negative result may occur with  improper specimen collection/handling, submission of specimen other than nasopharyngeal swab, presence of viral mutation(s) within the areas targeted by this assay, and inadequate number of viral copies (<131 copies/mL). A negative result must be combined with clinical observations, patient history, and epidemiological information. The expected result is Negative. Fact Sheet for Patients:  PinkCheek.be Fact Sheet for Healthcare Providers:  GravelBags.it This test is not yet ap proved or cleared by the [place] FDA and  has been authorized for detection and/or diagnosis of SARS-CoV-2 by FDA under an Emergency Use Authorization (EUA). This EUA will remain  in effect (meaning this test can be used) for the duration of the COVID-19 declaration under Section 564(b)(1) of the Act, 21 U.S.C. section 360bbb-3(b)(1), unless the authorization is terminated  or revoked sooner.    Influenza A by PCR NEGATIVE NEGATIVE   Influenza B by PCR NEGATIVE NEGATIVE    Comment: (NOTE) The Xpert Xpress SARS-CoV-2/FLU/RSV assay is intended as an aid in  the diagnosis of influenza from Nasopharyngeal swab specimens and  should not be used as a sole basis for treatment. Nasal washings and  aspirates are unacceptable for Xpert Xpress SARS-CoV-2/FLU/RSV  testing. Fact Sheet for Patients: PinkCheek.be Fact Sheet for Healthcare Providers: GravelBags.it This test is not yet approved or cleared by the [place] FDA and  has been authorized for detection and/or diagnosis of SARS-CoV-2 by  FDA under an Emergency Use Authorization (EUA). This EUA will remain  in effect (meaning this test can be used) for the duration of the  Covid-19 declaration under Section 564(b)(1) of the Act, 21  U.S.C. section 360bbb-3(b)(1), unless the authorization is  terminated or revoked. Performed at [facility], [address], [place], [place] [address]     Current Facility-Administered Medications  Medication Dose Route Frequency Provider Last Rate Last Admin  . aspirin EC tablet 81 mg  81 mg Oral Daily Blake Divine, MD   Stopped at 11/03/19 1421  . atorvastatin (LIPITOR) tablet 40 mg  40 mg Oral QHS Blake Divine, MD      . busPIRone (BUSPAR) tablet 10 mg  10 mg Oral BID Blake Divine, MD      . lamoTRIgine (LAMICTAL) tablet 200 mg  200 mg Oral Daily Blake Divine, MD   Stopped at 11/03/19 1422  . levothyroxine (SYNTHROID) tablet 175 mcg  175 mcg Oral Daily Blake Divine, MD   Stopped at 11/03/19 1422  . linagliptin (TRADJENTA) tablet 5 mg  5 mg Oral Daily Blake Divine, MD   Stopped at 11/03/19 1422  . losartan (COZAAR) tablet 50 mg  50 mg Oral Daily Blake Divine, MD   Stopped at 11/03/19 1422  . metFORMIN (GLUCOPHAGE) tablet 750 mg  750 mg Oral  Daily Blake Divine, MD   Stopped at  11/03/19 1427  . oxybutynin (DITROPAN) tablet 2.5 mg  2.5 mg Oral BID Blake Divine, MD      . pantoprazole (PROTONIX) EC tablet 40 mg  40 mg Oral Daily Blake Divine, MD   Stopped at 11/03/19 1426  . pregabalin (LYRICA) capsule 100 mg  100 mg Oral BID Blake Divine, MD      . QUEtiapine (SEROQUEL) tablet 25 mg  25 mg Oral BID Blake Divine, MD      . torsemide [facility]) tablet 20 mg  20 mg Oral Daily Blake Divine, MD   Stopped at 11/03/19 1427   Current Outpatient Medications  Medication Sig Dispense Refill  . ammonium lactate (LAC-HYDRIN) 12 % lotion Apply 1 application topically daily.    Marland Kitchen aspirin 81 MG tablet Take 1 tablet (81 mg total) by mouth daily. 30 tablet 0  . atorvastatin (LIPITOR) 40 MG tablet Take 40 mg by mouth at bedtime.     . busPIRone (BUSPAR) 10 MG tablet Take 10 mg by mouth 2 (two) times daily.     . calcium carbonate (TUMS - DOSED IN MG ELEMENTAL CALCIUM) 500 MG chewable tablet Chew 1 tablet by mouth 2 (two) times daily.    . cholecalciferol (VITAMIN D3) 25 MCG (1000 UNIT) tablet Take 1,000 Units by mouth daily.    . diclofenac Sodium (VOLTAREN) 1 % GEL Apply 2 g topically 2 (two) times daily.    Marland Kitchen lamoTRIgine (LAMICTAL) 200 MG tablet Take 200 mg by mouth daily.     Marland Kitchen levothyroxine (SYNTHROID) 175 MCG tablet Take 1 tablet (175 mcg total) by mouth daily.    Marland Kitchen linagliptin (TRADJENTA) 5 MG TABS tablet Take 5 mg by mouth daily.    Marland Kitchen losartan (COZAAR) 50 MG tablet Take 50 mg by mouth daily.    Marland Kitchen LYRICA 100 MG capsule Take 100 mg by mouth 2 (two) times daily.    . metFORMIN (GLUCOPHAGE) 500 MG tablet Take 750 mg by mouth daily.     Marland Kitchen nystatin (NYSTATIN) powder Apply 1 application topically 2 (two) times daily.    Marland Kitchen omeprazole (PRILOSEC) 20 MG capsule Take 20 mg by mouth daily.     Marland Kitchen oxybutynin (DITROPAN) 5 MG tablet Take 2.5 mg by mouth 2 (two) times daily.     Marland Kitchen oxyCODONE (OXY IR/ROXICODONE) 5 MG immediate release tablet Take 5 mg by mouth 2 (two) times daily.     . QUEtiapine (SEROQUEL) 25 MG tablet Take 25 mg by mouth daily in the afternoon.    . torsemide (DEMADEX) 20 MG tablet Take 20 mg by mouth daily.    Marland Kitchen acetaminophen (TYLENOL) 325 MG tablet Take 650 mg by mouth every 4 (four) hours as needed for mild pain.    Marland Kitchen acetaminophen (TYLENOL) 650 MG CR tablet Take 650 mg by mouth 3 (three) times daily.    Marland Kitchen alum & mag hydroxide-simeth (MAALOX/MYLANTA) 200-200-20 MG/5ML suspension Take 30 mLs by mouth every 6 (six) hours as needed for indigestion or heartburn.    . guaifenesin (ROBITUSSIN) 100 MG/5ML syrup Take 200 mg by mouth every 6 (six) hours as needed for cough.    . loperamide (IMODIUM) 2 MG capsule Take 2 mg by mouth 4 (four) times daily as needed for diarrhea or loose stools.     . magnesium hydroxide (MILK OF MAGNESIA) 400 MG/5ML suspension Take 30 mLs by mouth at bedtime as needed for mild constipation or moderate constipation.    Marland Kitchen  neomycin-bacitracin-polymyxin (NEOSPORIN) ointment Apply 1 application topically as needed for wound care.    . ondansetron (ZOFRAN) 4 MG tablet Take 4 mg by mouth every 6 (six) hours as needed for nausea or vomiting.    Marland Kitchen PROAIR HFA 108 (90 Base) MCG/ACT inhaler Inhale 1 puff into the lungs every 6 (six) hours as needed for wheezing or shortness of breath.     . QUEtiapine (SEROQUEL) 25 MG tablet Take 1 tablet (25 mg total) by mouth 2 (two) times daily for 30 doses. 30 tablet 0  . trolamine salicylate (ASPERCREME) 10 % cream Apply 1 application topically 3 (three) times daily as needed for muscle pain.       Musculoskeletal: Strength & Muscle Tone: within normal limits Gait & Station: unable to stand Patient leans: N/A  Psychiatric Specialty Exam: Physical Exam  Review of Systems  Blood pressure (!) 141/65, pulse 61, temperature 98.2 F (36.8 C), temperature source Oral, resp. rate 19, height 5\' 1"  (1.549 m), weight 105.8 kg, SpO2 99 %.Body mass index is 44.07 kg/m.  General Appearance: Fairly Groomed  Eye  Contact:  Fair  Speech:  Clear and Coherent  Volume:  Decreased  Mood:  Angry, Anxious, Depressed and Irritable  Affect:  Congruent  Thought Process:  Coherent  Orientation:  Full (Time, Place, and Person)  Thought Content:  Illogical, Paranoid Ideation and Rumination  Suicidal Thoughts:  Yes.  with intent/plan  Homicidal Thoughts:  No  Memory:  Immediate;   Fair  Judgement:  Impaired  Insight:  Lacking  Psychomotor Activity:  Decreased  Concentration:  Concentration: Fair  Recall:  [employer] of Knowledge:  Fair  Language:  Fair  Akathisia:  No  Handed:  Right  AIMS (if indicated):     Assets:  Resilience  ADL's:  Impaired  Cognition:  Impaired,  Moderate  Sleep:        Treatment Plan Summary: This is a 79 year old woman with a history of dementia who is presenting with behavioral disturbance in her nursing home.  Attempts were made to adjust patient's medications in order to help calm her down, however she was unable to be calmed down and required IM medication.  Patient continues to endorse suicidal ideation.  Patient may require inpatient geriatric psych placement.  We will continue to reassess for improvement of mood however at this time patient does meet criteria for [facility] psych placement.  Diagnosis: Dementia with behavioral disturbance   Disposition: Recommend psychiatric Inpatient admission when medically cleared.  Dixie Dials, MD 11/03/2019 6:45 PM

## 2019-11-03 NOTE — ED Notes (Addendum)
Pt verbally abusive to staff stating "I am not going back with them" and attempting to get out of bed even though she is a high fall risk and has dementia. Pt walked to chair and sat for a while while continuing to curse and swing at staff. Pt then placed a belongings bag over her head and stated "I just want to eat it all" and "I have no place to go". Pt was seen by psychiatry who cleared her to return to facility. Medication administered with help of this RN, EDP, Marya Amsler, RN and [employer], Therapist, sports. Pt assisted back to bed safely.

## 2019-11-03 NOTE — BH Assessment (Addendum)
Referral information for Psychiatric Hospitalization faxed to;   Marland Kitchen [facility] [phone]),   . [facility] [phone] -or- [phone]) [phone]  . [facility] ([phone]), Denied due to no current [facility] beds  . [facility] [phone], [phone], [phone] or [phone]),   . [facility] [phone]),   . [facility] [phone] -or- [phone]),   . [facility] [phone]),   . [facility] [phone] or [phone]) Denied due to aggression and dementia  . [facility] [phone] or [phone]),    [facility] [phone])   [facility] [phone] or [phone])

## 2019-11-03 NOTE — ED Notes (Signed)
Tried to give pt something to eat. Pt took food and was throwing it at staff.

## 2019-11-03 NOTE — ED Provider Notes (Addendum)
[facility] Emergency Department Provider Note   ____________________________________________   First MD Initiated Contact with Patient 11/03/19 [phone]     (approximate)  I have reviewed the triage vital signs and the nursing notes.   HISTORY  Chief Complaint Chest Pain and Behavioral Med Eval    HPI Melanie Young is a 79 y.o. female with past medical history of hypertension, diabetes, CKD, and dementia who presents to the ED for chest pain and aggressive behavior.  History is limited due to patient's baseline dementia.  Per EMS, patient has been aggressive at [facility], where she lives.  She has been fighting with staff there, throwing trash cans and other items, and attempting to bite staff.  They had asked the [employer] to IVC the patient, however they refused and EMS was called.  Patient currently complains of tightness in the center of her chest, states this has been constant for multiple days, but she has not had any fevers, cough, or shortness of breath.  Patient states she does not want to go back to nursing facility, states that she likes staff there but she wants to be by herself.        Past Medical History:  Diagnosis Date  . Breast cancer ([place]) 1980's   right breast ca with mastectomy  . Chronic kidney disease   . Dementia ([place])   . Depression   . Diabetes mellitus without complication ([place])   . Hypercholesteremia   . Hypertension   . Hypothyroidism   . Osteoporosis     Patient Active Problem List   Diagnosis Date Noted  . Sinus bradycardia 09/10/2019  . Fall   . Dizziness   . Urinary tract infection in elderly patient   . AKI (acute kidney injury) ([place]) 08/10/2017    Past Surgical History:  Procedure Laterality Date  . BREAST BIOPSY Left 2013   core needle bx, benign  . BREAST SURGERY    . CESAREAN SECTION    . COLONOSCOPY WITH PROPOFOL N/A 08/31/2018   Procedure: COLONOSCOPY WITH PROPOFOL;  Surgeon: Toledo, Benay Pike, MD;   Location: [facility] ENDOSCOPY;  Service: Endoscopy;  Laterality: N/A;  . MASTECTOMY Right 1973   breast ca  . THYROIDECTOMY      Prior to Admission medications   Medication Sig Start Date End Date Taking? Authorizing Provider  acetaminophen (TYLENOL) 325 MG tablet Take 650 mg by mouth every 4 (four) hours as needed for mild pain.    [provider]  acetaminophen (TYLENOL) 650 MG CR tablet Take 650 mg by mouth 3 (three) times daily.    [provider]  alum & mag hydroxide-simeth (MAALOX/MYLANTA) 200-200-20 MG/5ML suspension Take 30 mLs by mouth every 6 (six) hours as needed for indigestion or heartburn.    [provider]  ammonium lactate (LAC-HYDRIN) 12 % lotion Apply 1 application topically daily.    [provider]  aspirin 81 MG tablet Take 1 tablet (81 mg total) by mouth daily. 09/12/19   Lorella Nimrod, MD  atorvastatin (LIPITOR) 40 MG tablet Take 40 mg by mouth at bedtime.     [provider]  busPIRone (BUSPAR) 10 MG tablet Take 10 mg by mouth 2 (two) times daily.     [provider]  calcium carbonate (TUMS - DOSED IN MG ELEMENTAL CALCIUM) 500 MG chewable tablet Chew 1 tablet by mouth 2 (two) times daily.    [provider]  cholecalciferol (VITAMIN D3) 25 MCG (1000 UNIT) tablet Take 1,000  Units by mouth daily.    [provider]  diclofenac Sodium (VOLTAREN) 1 % GEL Apply 2 g topically 2 (two) times daily.    [provider]  guaifenesin (ROBITUSSIN) 100 MG/5ML syrup Take 200 mg by mouth every 6 (six) hours as needed for cough.    [provider]  lamoTRIgine (LAMICTAL) 200 MG tablet Take 200 mg by mouth daily.     [provider]  levothyroxine (SYNTHROID) 175 MCG tablet Take 1 tablet (175 mcg total) by mouth daily. 09/13/19   Lorella Nimrod, MD  linagliptin (TRADJENTA) 5 MG TABS tablet Take 5 mg by mouth daily.    [provider]  loperamide (IMODIUM) 2 MG capsule Take 2 mg by mouth  4 (four) times daily as needed for diarrhea or loose stools.     [provider]  losartan (COZAAR) 50 MG tablet Take 50 mg by mouth daily. 07/25/17   [provider]  LYRICA 100 MG capsule Take 100 mg by mouth 2 (two) times daily.    [provider]  magnesium hydroxide (MILK OF MAGNESIA) 400 MG/5ML suspension Take 30 mLs by mouth at bedtime as needed for mild constipation or moderate constipation.    [provider]  metFORMIN (GLUCOPHAGE) 500 MG tablet Take 750 mg by mouth daily.     [provider]  neomycin-bacitracin-polymyxin (NEOSPORIN) ointment Apply 1 application topically as needed for wound care.    [provider]  nystatin (NYSTATIN) powder Apply 1 application topically 2 (two) times daily.    [provider]  omeprazole (PRILOSEC) 20 MG capsule Take 20 mg by mouth daily.     [provider]  ondansetron (ZOFRAN) 4 MG tablet Take 4 mg by mouth every 6 (six) hours as needed for nausea or vomiting.    [provider]  oxybutynin (DITROPAN) 5 MG tablet Take 2.5 mg by mouth 2 (two) times daily.     [provider]  oxyCODONE (OXY IR/ROXICODONE) 5 MG immediate release tablet Take 5 mg by mouth 2 (two) times daily.    [provider]  PROAIR HFA 108 (90 Base) MCG/ACT inhaler Inhale 1 puff into the lungs every 6 (six) hours as needed for wheezing or shortness of breath.     [provider]  QUEtiapine (SEROQUEL) 25 MG tablet Take 1 tablet (25 mg total) by mouth 2 (two) times daily for 30 doses. 11/03/19 11/18/19  Cristofano, Dorene Ar, MD  torsemide (DEMADEX) 20 MG tablet Take 20 mg by mouth daily.    [provider]  trolamine salicylate (ASPERCREME) 10 % cream Apply 1 application topically 3 (three) times daily as needed for muscle pain.     [provider]    Allergies Patient has no known allergies.  Family History  Problem Relation Age of Onset  . Breast cancer  Maternal Aunt   . Breast cancer Cousin        3 maternal cousins, 4's    Social History Social History   Tobacco Use  . Smoking status: Never Smoker  . Smokeless tobacco: Never Used  Substance Use Topics  . Alcohol use: Not Currently  . Drug use: Never    Review of Systems  Constitutional: No fever/chills Eyes: No visual changes. ENT: No sore throat. Cardiovascular: Positive for chest pain. Respiratory: Denies shortness of breath. Gastrointestinal: No abdominal pain.  No nausea, no vomiting.  No diarrhea.  No constipation. Genitourinary: Negative for dysuria. Musculoskeletal: Negative for back pain. Skin:  Negative for rash. Neurological: Negative for headaches, focal weakness or numbness.  ____________________________________________   PHYSICAL EXAM:  VITAL SIGNS: ED Triage Vitals [11/03/19 0755]  Enc Vitals Group     BP      Pulse      Resp      Temp      Temp src      SpO2      Weight 233 lb 4 oz (105.8 kg)     Height 5\' 1"  (1.549 m)     Head Circumference      Peak Flow      Pain Score 10     Pain Loc      Pain Edu?      Excl. in [place]?     Constitutional: Awake and alert, at baseline mental status. Eyes: Conjunctivae are normal. Head: Atraumatic. Nose: No congestion/rhinnorhea. Mouth/Throat: Mucous membranes are moist. Neck: Normal ROM Cardiovascular: Bradycardic, regular rhythm. Grossly normal heart sounds. Respiratory: Normal respiratory effort.  No retractions. Lungs CTAB. Gastrointestinal: Soft and nontender. No distention. Genitourinary: deferred Musculoskeletal: No lower extremity tenderness nor edema. Neurologic:  Normal speech and language. No gross focal neurologic deficits are appreciated. Skin:  Skin is warm, dry and intact. No rash noted. Psychiatric: Mood and affect are normal. Speech and behavior are normal.  ____________________________________________   LABS (all labs ordered are listed, but only abnormal results are  displayed)  Labs Reviewed  BASIC METABOLIC PANEL - Abnormal; Notable for the following components:      Result Value   Glucose, Bld 114 (*)    GFR calc non Af Amer 56 (*)    All other components within normal limits  CBC WITH DIFFERENTIAL/PLATELET - Abnormal; Notable for the following components:   Hemoglobin 11.9 (*)    RDW 15.6 (*)    Platelets 101 (*)    All other components within normal limits  TROPONIN I (HIGH SENSITIVITY)   ____________________________________________  EKG  ED ECG REPORT I, Blake Divine, the attending physician, personally viewed and interpreted this ECG.   Date: 11/03/2019  EKG Time: 8:00  Rate: 55  Rhythm: sinus bradycardia, PAC's noted  Axis: Normal  Intervals:left anterior fascicular block  ST&T Change: None   PROCEDURES  Procedure(s) performed (including Critical Care):  Procedures   ____________________________________________   INITIAL IMPRESSION / ASSESSMENT AND PLAN / ED COURSE       79 year old female with history of dementia, hypertension, diabetes, and CKD presents to the ED from nursing facility for aggressive behavior there, fighting and attempting to bite staff.  She is calm and cooperative here in the ED and appears to be at her baseline mental status.  She does not complain of chest pain, which appears to be a chronic complaint of hers and we will screen EKG, labs, and chest x-ray.  Low suspicion for acute cardiac process or other acute pathology.  Patient noted to be bradycardic here in the ED, but reviewing chart she has a long history of this and appears asymptomatic currently with stable blood pressure.  ----------------------------------------- 9:14 AM on 11/03/2019 -----------------------------------------  Lab work is unremarkable and patient remains calm and cooperative here in the ED. no apparent emergent etiology for patient's chest pain and she is medically cleared.  Given her aggression at nursing facility, we  will have psychiatry evaluate for any possible changes to her medication regimen.  The patient has been placed in psychiatric observation due to the need to provide a safe environment for the patient while obtaining  psychiatric consultation and evaluation, as well as ongoing medical and medication management to treat the patient's condition.  The patient has not been placed under full IVC at this time.  ----------------------------------------- 11:40 AM on 11/03/2019 -----------------------------------------  Patient has been cleared by psychiatry and she remains calm and cooperative here in the ED.  Psychiatry to increase patient Seroquel to twice daily dosing and we will give initial dose here in the ED.  We will also refer her to hematology for worsening thrombocytopenia, no apparent bleeding at this time.  ----------------------------------------- 12:28 PM on 11/03/2019 -----------------------------------------  Prior to discharge, patient became increasingly combative and at one point attempted to suffocate herself by placing a plastic bag over her head.  She required IM droperidol to ensure the safety of herself and staff.  Case was again discussed with psychiatry and we will seek [facility] psych placement.       ____________________________________________   FINAL CLINICAL IMPRESSION(S) / ED DIAGNOSES  Final diagnoses:  Atypical chest pain  Dementia with behavioral disturbance, unspecified dementia type ([place])  Thrombocytopenia [facility])     ED Discharge Orders         Ordered    QUEtiapine (SEROQUEL) 25 MG tablet  2 times daily     11/03/19 1127           Note:  This document was prepared using Dragon voice recognition software and may include unintentional dictation errors.   Blake Divine, MD 11/03/19 1141    Blake Divine, MD 11/03/19 1233

## 2019-11-03 NOTE — ED Notes (Signed)
Pt given 2 warm blankets at this time, denies further needs.

## 2019-11-03 NOTE — ED Triage Notes (Signed)
Pt presents to ED via [facility] from [facility] for aggressive behavior. Per EMS staff at [facility] wanted patient IVC however [employer] would not IVC patient. Per EMS pt was throwing trash cans and other items at staff and trying to bite staff. EMS reports pt is in the memory care area at [facility]. Upon arrival patient states "I'm not going back there, I'm not going back to no nursing home". Per EMS pt stating that she just wants to keep to herself and not be bothered by other residents, per EMS pt states they are taking care of too many residents.   EMS reports pt with hx of chronic pain, reports pt stating pain in her chest, initially all night then reporting x 3 months.  Upon arrival to ED pt also reports cough and dizziness.  When asked orientation questions pt noted to stare off and refuse to answer questions, however immediately when asked where her pain was patient immediately able to answer and point to her pain. UTA orientation due to patient refusal to answer.

## 2019-11-03 NOTE — ED Notes (Signed)
Assisted pt to the restroom. Pt got back in bed we turned TV on, gave her some food, and placed pure wick on pt. Pt is resting and trying to sleep at this time.

## 2019-11-04 LAB — GLUCOSE, CAPILLARY: Glucose-Capillary: 172 mg/dL — ABNORMAL HIGH (ref 70–99)

## 2019-11-04 NOTE — ED Notes (Signed)
Patient is complaining of being very uncomfortable and wants to sit in a wheel chair. I explained that it was not possible for her to sit in a wheelchair and assured her I would ask about getting her a hospital bed. Patient seemed to calm down for a moment.AS

## 2019-11-04 NOTE — ED Notes (Signed)
Sitter at bedside. Pt sitting up and respirations even and unlabored.

## 2019-11-04 NOTE — ED Notes (Signed)
Pt assisted to bedside toilet with RN and NT

## 2019-11-04 NOTE — ED Notes (Signed)
Pt transferred to sitting in chair as requested. Denies any other needs at this time. 1:1 sitter at bedside for safety.

## 2019-11-04 NOTE — ED Notes (Signed)
Patient tossing and turning and moaning. Very restless.AS

## 2019-11-04 NOTE — ED Notes (Addendum)
Pt calm and resting in bed. Pt is being pleasant towards staff at this time. This EDT continuing to sit 1:1 with pt for pt safety

## 2019-11-04 NOTE — ED Notes (Signed)
Pt given meal tray.

## 2019-11-04 NOTE — ED Notes (Signed)
Pt given breakfast tray. Pt eating at this time. Sitter at bedside

## 2019-11-04 NOTE — ED Notes (Signed)
Pt changed and peri care provided with this RN and sitter. Pt denies any other needs at this time

## 2019-11-04 NOTE — ED Notes (Signed)
Pt given lunch tray.

## 2019-11-04 NOTE — ED Notes (Signed)
Patient resting for now, has bee tossing and turning trying to get out of bed.AS

## 2019-11-05 DIAGNOSIS — F332 Major depressive disorder, recurrent severe without psychotic features: Secondary | ICD-10-CM | POA: Diagnosis present

## 2019-11-05 MED ORDER — SERTRALINE HCL 25 MG PO TABS
25.0000 mg | ORAL_TABLET | Freq: Every day | ORAL | Status: DC
  Administered 2019-11-05 – 2019-11-18 (×14): 25 mg via ORAL
  Filled 2019-11-05 (×15): qty 1

## 2019-11-05 NOTE — BH Assessment (Addendum)
Referral Information Followed-Up With:    Melanie Young [phone]) - at capacity, check in on Monday - Melanie Young @ [facility] [phone]) - denied due to acuity - Melanie Young @ [place] [phone]) - message left at [facility] [phone]) - at [place] @ [address] [phone]) - do not have, re-fax to [phone], Melanie Young @ [address] [phone]) - at [place] @ [phone]   [place] [phone]) - at [place] @ [phone]   [place] [phone] or [phone]) - denied due to aggression and dementia   Melanie Young [phone]) - denied due to acuity Melanie Young @ [facility] [phone]) - left message at [phone]

## 2019-11-05 NOTE — ED Notes (Signed)
Pt is complaining of knee pain, Musician notified

## 2019-11-05 NOTE — ED Notes (Signed)
Pt given sandwich tray 

## 2019-11-05 NOTE — ED Notes (Signed)
Pt voided in bedside commode.

## 2019-11-05 NOTE — ED Provider Notes (Signed)
-----------------------------------------   6:11 AM on 11/05/2019 -----------------------------------------   Blood pressure 127/66, pulse (!) 48, temperature 97.7 F (36.5 C), temperature source Oral, resp. rate 16, height 5\' 1"  (1.549 m), weight 105.8 kg, SpO2 100 %.  The patient is calm and cooperative at this time.  There have been no acute events since the last update.  Awaiting disposition plan from Behavioral Medicine and/or Social Work team(s).   Paulette Blanch, MD 11/05/19 [phone]

## 2019-11-05 NOTE — ED Notes (Signed)
Pt sleeping at this time. This tech assigned as Actuary.

## 2019-11-05 NOTE — BH Assessment (Signed)
Referral Information Followed-Up With:  Rosana Hoes [phone]) - voicemail reached, a second message not left [place] [phone]) - at capacity, check in on Monday Annie Main at [facility] [phone]) - was informed admissions personnel are not in on the weekends - [phone]

## 2019-11-05 NOTE — Consult Note (Addendum)
[facility] Face-to-Face Psychiatry Consult   Reason for Consult:  Suicidal ideations Referring Physician:  EDP Patient Identification: Melanie Young MRN:  [id number] Principal Diagnosis: MDD (major depressive disorder), recurrent episode, severe ([place]) Diagnosis:  Principal Problem:   MDD (major depressive disorder), recurrent episode, severe ([place])   Total Time spent with patient: 30 minutes  Subjective:   Melanie Young is a 79 y.o. female patient reports today that she still feels very depressed.  She states that isn't that she did not like the facility.  She reports that the facility is a very nice place and the staff there worked extremely hard to help all of the patients that are there.  She states that she just feels that she is a burden on them because she has started having more issues.  She states that she feels that she has a burden on everyone else and that there is no reason to live anymore.  She states that she is not even sure that another facility would better assist her or make her feel any better.  She reports that she feels that maybe been admitted to inpatient psychiatric unit could possibly improve her mood with the right medications and therapy.  She states that she does have people to talk to at the facility and she is on medications for her depression and anxiety but it does not seem to be getting any better.  She continues to report multiple life stressors such as family, feeling abandoned by family, overwhelming staff at the facility, multiple medical issues, etc.  HPI:  Per EDP: 79 y.o. female  With PMHx dementia, DM, HTN, HLD, dementia, here with reported bradycardia. Per report from the facility, pt was found to be less responsive than usual and more confused. She was noted to be bradycardic to the 40s so EMS was called. EMS was told pt was on metoprolol but per [facility], is not. On arrival here, pt states her legs hurt but they have hurt "for a while." Denies any other complaints,  though history limited 2/2 dementia. She does point to her chest as an area of pain on repeat questioning.  Patient is evaluated by this provider via face-to-face and have consulted with Dr. Mallie Darting.  Patient has a diagnosis of dementia but is a fairly good historian and only lacks specifics about events and medications.  Patient continues to appear very depressed and is very tearful during the evaluation.  Patient continues to state that she does not feel like she should continue living anymore because she is such a burden on people and that her life is never going to get any better.  Patient continues to meet inpatient psychiatric geropsychiatric treatment.  I have notified Dr. Jimmye Norman of the recommendations.  Past Psychiatric History: Patient reports 1 self admission for psychiatric hospitalization years ago for depression.  She does report also having a suicide attempt years ago prior to her psychiatric admission where she attempted to overdose on medications.  She states that she is dealt with depression throughout most of her life.  She denies having any history of bipolar disorder or any type of psychotic features diagnosed or reported in the past.  Patient only reports history of depression and anxiety.  Risk to Self:   Risk to Others:   Prior Inpatient Therapy:   Prior Outpatient Therapy:    Past Medical History:  Past Medical History:  Diagnosis Date  . Breast cancer ([place]) 1980's   right breast ca with mastectomy  . Chronic kidney  disease   . Dementia ([place])   . Depression   . Diabetes mellitus without complication ([place])   . Hypercholesteremia   . Hypertension   . Hypothyroidism   . Osteoporosis     Past Surgical History:  Procedure Laterality Date  . BREAST BIOPSY Left 2013   core needle bx, benign  . BREAST SURGERY    . CESAREAN SECTION    . COLONOSCOPY WITH PROPOFOL N/A 08/31/2018   Procedure: COLONOSCOPY WITH PROPOFOL;  Surgeon: Toledo, Benay Pike, MD;  Location: [facility]  ENDOSCOPY;  Service: Endoscopy;  Laterality: N/A;  . MASTECTOMY Right 1973   breast ca  . THYROIDECTOMY     Family History:  Family History  Problem Relation Age of Onset  . Breast cancer Maternal Aunt   . Breast cancer Cousin        3 maternal cousins, 34's   Family Psychiatric  History: None reported Social History:  Social History   Substance and Sexual Activity  Alcohol Use Not Currently     Social History   Substance and Sexual Activity  Drug Use Never    Social History   Socioeconomic History  . Marital status: Single    Spouse name: Not on file  . Number of children: Not on file  . Years of education: Not on file  . Highest education level: Not on file  Occupational History  . Not on file  Tobacco Use  . Smoking status: Never Smoker  . Smokeless tobacco: Never Used  Substance and Sexual Activity  . Alcohol use: Not Currently  . Drug use: Never  . Sexual activity: Not on file  Other Topics Concern  . Not on file  Social History Narrative  . Not on file   Social Determinants of Health   Financial Resource Strain:   . Difficulty of Paying Living Expenses:   Food Insecurity:   . Worried About Charity fundraiser in the Last Year:   . Arboriculturist in the Last Year:   Transportation Needs:   . Film/video editor (Medical):   Marland Kitchen Lack of Transportation (Non-Medical):   Physical Activity:   . Days of Exercise per Week:   . Minutes of Exercise per Session:   Stress:   . Feeling of Stress :   Social Connections:   . Frequency of Communication with Friends and Family:   . Frequency of Social Gatherings with Friends and Family:   . Attends Religious Services:   . Active Member of Clubs or Organizations:   . Attends Archivist Meetings:   Marland Kitchen Marital Status:    Additional Social History:    Allergies:  No Known Allergies  Labs:  Results for orders placed or performed during the hospital encounter of 11/03/19 (from the past 48 hour(s))   Respiratory Panel by RT PCR (Flu A&B, Covid) - Nasopharyngeal Swab     Status: None   Collection Time: 11/03/19  3:24 PM   Specimen: Nasopharyngeal Swab  Result Value Ref Range   SARS Coronavirus 2 by RT PCR NEGATIVE NEGATIVE    Comment: (NOTE) SARS-CoV-2 target nucleic acids are NOT DETECTED. The SARS-CoV-2 RNA is generally detectable in upper respiratoy specimens during the acute phase of infection. The lowest concentration of SARS-CoV-2 viral copies this assay can detect is 131 copies/mL. A negative result does not preclude SARS-Cov-2 infection and should not be used as the sole basis for treatment or other patient management decisions. A negative result may occur with  improper specimen collection/handling, submission of specimen other than nasopharyngeal swab, presence of viral mutation(s) within the areas targeted by this assay, and inadequate number of viral copies (<131 copies/mL). A negative result must be combined with clinical observations, patient history, and epidemiological information. The expected result is Negative. Fact Sheet for Patients:  PinkCheek.be Fact Sheet for Healthcare Providers:  GravelBags.it This test is not yet ap proved or cleared by the [place] FDA and  has been authorized for detection and/or diagnosis of SARS-CoV-2 by FDA under an Emergency Use Authorization (EUA). This EUA will remain  in effect (meaning this test can be used) for the duration of the COVID-19 declaration under Section 564(b)(1) of the Act, 21 U.S.C. section 360bbb-3(b)(1), unless the authorization is terminated or revoked sooner.    Influenza A by PCR NEGATIVE NEGATIVE   Influenza B by PCR NEGATIVE NEGATIVE    Comment: (NOTE) The Xpert Xpress SARS-CoV-2/FLU/RSV assay is intended as an aid in  the diagnosis of influenza from Nasopharyngeal swab specimens and  should not be used as a sole basis for treatment. Nasal  washings and  aspirates are unacceptable for Xpert Xpress SARS-CoV-2/FLU/RSV  testing. Fact Sheet for Patients: PinkCheek.be Fact Sheet for Healthcare Providers: GravelBags.it This test is not yet approved or cleared by the [place] FDA and  has been authorized for detection and/or diagnosis of SARS-CoV-2 by  FDA under an Emergency Use Authorization (EUA). This EUA will remain  in effect (meaning this test can be used) for the duration of the  Covid-19 declaration under Section 564(b)(1) of the Act, 21  U.S.C. section 360bbb-3(b)(1), unless the authorization is  terminated or revoked. Performed at [facility], [address] [place], [place] [address]   Glucose, capillary     Status: Abnormal   Collection Time: 11/04/19 11:14 AM  Result Value Ref Range   Glucose-Capillary 172 (H) 70 - 99 mg/dL    Comment: Glucose reference range applies only to samples taken after fasting for at least 8 hours.   Comment 1 Notify RN    Comment 2 Document in Chart     Current Facility-Administered Medications  Medication Dose Route Frequency Provider Last Rate Last Admin  . aspirin EC tablet 81 mg  81 mg Oral Daily Blake Divine, MD   81 mg at 11/04/19 1111  . atorvastatin (LIPITOR) tablet 40 mg  40 mg Oral QHS Blake Divine, MD   40 mg at 11/04/19 2300  . busPIRone (BUSPAR) tablet 10 mg  10 mg Oral BID Blake Divine, MD   10 mg at 11/04/19 2217  . lamoTRIgine (LAMICTAL) tablet 200 mg  200 mg Oral Daily Blake Divine, MD   200 mg at 11/04/19 1116  . levothyroxine (SYNTHROID) tablet 175 mcg  175 mcg Oral Daily Blake Divine, MD   175 mcg at 11/04/19 1115  . linagliptin (TRADJENTA) tablet 5 mg  5 mg Oral Daily Blake Divine, MD   5 mg at 11/04/19 1109  . losartan (COZAAR) tablet 50 mg  50 mg Oral Daily Blake Divine, MD   50 mg at 11/04/19 1116  . metFORMIN (GLUCOPHAGE) tablet 750 mg  750 mg Oral Daily Blake Divine, MD   750 mg at 11/04/19 1110  . oxybutynin (DITROPAN) tablet 2.5 mg  2.5 mg Oral BID Blake Divine, MD   2.5 mg at 11/04/19 2216  . pantoprazole (PROTONIX) EC tablet 40 mg  40 mg Oral Daily Blake Divine, MD   40 mg at 11/04/19 1114  .  pregabalin (LYRICA) capsule 100 mg  100 mg Oral BID Blake Divine, MD   100 mg at 11/04/19 2218  . QUEtiapine (SEROQUEL) tablet 25 mg  25 mg Oral BID Blake Divine, MD   25 mg at 11/04/19 2300  . torsemide (DEMADEX) tablet 20 mg  20 mg Oral Daily Blake Divine, MD   20 mg at 11/04/19 1109   Current Outpatient Medications  Medication Sig Dispense Refill  . ammonium lactate (LAC-HYDRIN) 12 % lotion Apply 1 application topically daily.    Marland Kitchen aspirin 81 MG tablet Take 1 tablet (81 mg total) by mouth daily. 30 tablet 0  . atorvastatin (LIPITOR) 40 MG tablet Take 40 mg by mouth at bedtime.     . busPIRone (BUSPAR) 10 MG tablet Take 10 mg by mouth 2 (two) times daily.     . calcium carbonate (TUMS - DOSED IN MG ELEMENTAL CALCIUM) 500 MG chewable tablet Chew 1 tablet by mouth 2 (two) times daily.    . cholecalciferol (VITAMIN D3) 25 MCG (1000 UNIT) tablet Take 1,000 Units by mouth daily.    . diclofenac Sodium (VOLTAREN) 1 % GEL Apply 2 g topically 2 (two) times daily.    Marland Kitchen lamoTRIgine (LAMICTAL) 200 MG tablet Take 200 mg by mouth daily.     Marland Kitchen levothyroxine (SYNTHROID) 175 MCG tablet Take 1 tablet (175 mcg total) by mouth daily.    Marland Kitchen linagliptin (TRADJENTA) 5 MG TABS tablet Take 5 mg by mouth daily.    Marland Kitchen losartan (COZAAR) 50 MG tablet Take 50 mg by mouth daily.    Marland Kitchen LYRICA 100 MG capsule Take 100 mg by mouth 2 (two) times daily.    . metFORMIN (GLUCOPHAGE) 500 MG tablet Take 750 mg by mouth daily.     Marland Kitchen nystatin (NYSTATIN) powder Apply 1 application topically 2 (two) times daily.    Marland Kitchen omeprazole (PRILOSEC) 20 MG capsule Take 20 mg by mouth daily.     Marland Kitchen oxybutynin (DITROPAN) 5 MG tablet Take 2.5 mg by mouth 2 (two) times daily.     Marland Kitchen oxyCODONE (OXY  IR/ROXICODONE) 5 MG immediate release tablet Take 5 mg by mouth 2 (two) times daily.    . QUEtiapine (SEROQUEL) 25 MG tablet Take 25 mg by mouth daily in the afternoon.    . torsemide (DEMADEX) 20 MG tablet Take 20 mg by mouth daily.    Marland Kitchen acetaminophen (TYLENOL) 325 MG tablet Take 650 mg by mouth every 4 (four) hours as needed for mild pain.    Marland Kitchen acetaminophen (TYLENOL) 650 MG CR tablet Take 650 mg by mouth 3 (three) times daily.    Marland Kitchen alum & mag hydroxide-simeth (MAALOX/MYLANTA) 200-200-20 MG/5ML suspension Take 30 mLs by mouth every 6 (six) hours as needed for indigestion or heartburn.    . guaifenesin (ROBITUSSIN) 100 MG/5ML syrup Take 200 mg by mouth every 6 (six) hours as needed for cough.    . loperamide (IMODIUM) 2 MG capsule Take 2 mg by mouth 4 (four) times daily as needed for diarrhea or loose stools.     . magnesium hydroxide (MILK OF MAGNESIA) 400 MG/5ML suspension Take 30 mLs by mouth at bedtime as needed for mild constipation or moderate constipation.    Marland Kitchen neomycin-bacitracin-polymyxin (NEOSPORIN) ointment Apply 1 application topically as needed for wound care.    . ondansetron (ZOFRAN) 4 MG tablet Take 4 mg by mouth every 6 (six) hours as needed for nausea or vomiting.    Marland Kitchen PROAIR HFA 108 (90 Base) MCG/ACT  inhaler Inhale 1 puff into the lungs every 6 (six) hours as needed for wheezing or shortness of breath.     . QUEtiapine (SEROQUEL) 25 MG tablet Take 1 tablet (25 mg total) by mouth 2 (two) times daily for 30 doses. 30 tablet 0  . trolamine salicylate (ASPERCREME) 10 % cream Apply 1 application topically 3 (three) times daily as needed for muscle pain.       Musculoskeletal: Strength & Muscle Tone: decreased Gait & Station: unsteady, needs assistance to ambulate Patient leans: N/A  Psychiatric Specialty Exam: Physical Exam  Nursing note and vitals reviewed. Constitutional: She is oriented to person, place, and time. She appears well-developed and well-nourished.  Respiratory:  Effort normal.  Musculoskeletal:        General: Normal range of motion.  Neurological: She is alert and oriented to person, place, and time.  Skin: Skin is warm.  Psychiatric: She exhibits a depressed mood.    Review of Systems  Constitutional: Negative.   HENT: Negative.   Eyes: Negative.   Respiratory: Negative.   Cardiovascular: Negative.   Gastrointestinal: Negative.   Genitourinary: Negative.   Musculoskeletal: Positive for arthralgias, back pain and myalgias.  Skin: Negative.   Neurological: Negative.   Psychiatric/Behavioral: Positive for dysphoric mood and suicidal ideas. The patient is nervous/anxious.     Blood pressure 127/66, pulse (!) 48, temperature 97.7 F (36.5 C), temperature source Oral, resp. rate 16, height 5\' 1"  (1.549 m), weight 105.8 kg, SpO2 100 %.Body mass index is 44.07 kg/m.  General Appearance: Disheveled  Eye Contact:  Fair  Speech:  Clear and Coherent and Normal Rate  Volume:  Decreased  Mood:  Depressed and Hopeless  Affect:  Depressed and Tearful  Thought Process:  Coherent and Descriptions of Associations: Intact  Orientation:  Full (Time, Place, and Person)  Thought Content:  WDL  Suicidal Thoughts:  No active plan, but feels like she would be better off if dead  Homicidal Thoughts:  No  Memory:  Immediate;   Fair Recent;   Fair Remote;   Fair  Judgement:  Fair  Insight:  Fair  Psychomotor Activity:  Decreased  Concentration:  Concentration: Fair  Recall:  [employer] of Knowledge:  Fair  Language:  Fair  Akathisia:  No  Handed:  Right  AIMS (if indicated):     Assets:  Desire for Improvement Financial Resources/Insurance Housing Resilience  ADL's:  Impaired  Cognition:  Impaired,  Mild  Sleep:        Treatment Plan Summary: Medication management  Continue Lamictal 200 mg p.o. twice daily, continue Seroquel 25 mg p.o. twice daily, continue BuSpar 10 mg p.o. twice daily Start Zoloft 25 mg PO Daily Order Thyroid  Panel  Disposition: Recommend psychiatric Inpatient admission when medically cleared.  [place], FNP 11/05/2019 9:35 AM

## 2019-11-06 ENCOUNTER — Emergency Department: Payer: Medicare Other

## 2019-11-06 ENCOUNTER — Encounter: Payer: Self-pay | Admitting: Internal Medicine

## 2019-11-06 ENCOUNTER — Other Ambulatory Visit: Payer: Self-pay

## 2019-11-06 DIAGNOSIS — I952 Hypotension due to drugs: Secondary | ICD-10-CM | POA: Diagnosis not present

## 2019-11-06 DIAGNOSIS — F0391 Unspecified dementia with behavioral disturbance: Secondary | ICD-10-CM | POA: Diagnosis present

## 2019-11-06 DIAGNOSIS — E785 Hyperlipidemia, unspecified: Secondary | ICD-10-CM

## 2019-11-06 DIAGNOSIS — E890 Postprocedural hypothyroidism: Secondary | ICD-10-CM

## 2019-11-06 DIAGNOSIS — F03918 Unspecified dementia, unspecified severity, with other behavioral disturbance: Secondary | ICD-10-CM | POA: Diagnosis present

## 2019-11-06 DIAGNOSIS — Z853 Personal history of malignant neoplasm of breast: Secondary | ICD-10-CM

## 2019-11-06 DIAGNOSIS — N183 Chronic kidney disease, stage 3 unspecified: Secondary | ICD-10-CM

## 2019-11-06 DIAGNOSIS — I10 Essential (primary) hypertension: Secondary | ICD-10-CM

## 2019-11-06 DIAGNOSIS — R001 Bradycardia, unspecified: Secondary | ICD-10-CM

## 2019-11-06 DIAGNOSIS — I959 Hypotension, unspecified: Secondary | ICD-10-CM

## 2019-11-06 DIAGNOSIS — E1169 Type 2 diabetes mellitus with other specified complication: Secondary | ICD-10-CM

## 2019-11-06 LAB — GLUCOSE, CAPILLARY: Glucose-Capillary: 99 mg/dL (ref 70–99)

## 2019-11-06 LAB — CBC
HCT: 34.7 % — ABNORMAL LOW (ref 36.0–46.0)
Hemoglobin: 10.7 g/dL — ABNORMAL LOW (ref 12.0–15.0)
MCH: 27.5 pg (ref 26.0–34.0)
MCHC: 30.8 g/dL (ref 30.0–36.0)
MCV: 89.2 fL (ref 80.0–100.0)
Platelets: 187 10*3/uL (ref 150–400)
RBC: 3.89 MIL/uL (ref 3.87–5.11)
RDW: 16.2 % — ABNORMAL HIGH (ref 11.5–15.5)
WBC: 6.2 10*3/uL (ref 4.0–10.5)
nRBC: 0 % (ref 0.0–0.2)

## 2019-11-06 LAB — THYROID PANEL WITH TSH
Free Thyroxine Index: 1.5 (ref 1.2–4.9)
T3 Uptake Ratio: 25 % (ref 24–39)
T4, Total: 6 ug/dL (ref 4.5–12.0)
TSH: 7.36 u[IU]/mL — ABNORMAL HIGH (ref 0.450–4.500)

## 2019-11-06 LAB — BASIC METABOLIC PANEL
Anion gap: 9 (ref 5–15)
BUN: 31 mg/dL — ABNORMAL HIGH (ref 8–23)
CO2: 25 mmol/L (ref 22–32)
Calcium: 9 mg/dL (ref 8.9–10.3)
Chloride: 106 mmol/L (ref 98–111)
Creatinine, Ser: 2.56 mg/dL — ABNORMAL HIGH (ref 0.44–1.00)
GFR calc Af Amer: 20 mL/min — ABNORMAL LOW (ref >60)
GFR calc non Af Amer: 18 mL/min — ABNORMAL LOW (ref >60)
Glucose, Bld: 101 mg/dL — ABNORMAL HIGH (ref 70–99)
Potassium: 4.6 mmol/L (ref 3.5–5.1)
Sodium: 140 mmol/L (ref 135–145)

## 2019-11-06 MED ORDER — LEVOTHYROXINE SODIUM 100 MCG PO TABS
200.0000 ug | ORAL_TABLET | Freq: Every day | ORAL | Status: DC
  Administered 2019-11-06 – 2019-11-23 (×18): 200 ug via ORAL
  Filled 2019-11-06 (×6): qty 2
  Filled 2019-11-06: qty 4
  Filled 2019-11-06 (×12): qty 2

## 2019-11-06 MED ORDER — ONDANSETRON HCL 4 MG/2ML IJ SOLN
4.0000 mg | Freq: Four times a day (QID) | INTRAMUSCULAR | Status: DC | PRN
  Filled 2019-11-06: qty 2

## 2019-11-06 MED ORDER — SODIUM CHLORIDE 0.9 % IV SOLN: INTRAVENOUS | Status: AC

## 2019-11-06 MED ORDER — ACETAMINOPHEN 500 MG PO TABS
ORAL_TABLET | ORAL | Status: AC
  Filled 2019-11-06: qty 2

## 2019-11-06 MED ORDER — DICLOFENAC SODIUM 1 % EX GEL
2.0000 g | Freq: Two times a day (BID) | CUTANEOUS | Status: DC | PRN
  Administered 2019-11-06 – 2019-11-17 (×7): 2 g via TOPICAL
  Filled 2019-11-06 (×2): qty 100

## 2019-11-06 MED ORDER — SENNOSIDES-DOCUSATE SODIUM 8.6-50 MG PO TABS
1.0000 | ORAL_TABLET | Freq: Every evening | ORAL | Status: DC | PRN
  Administered 2019-11-21: 1 via ORAL
  Filled 2019-11-06: qty 1

## 2019-11-06 MED ORDER — ACETAMINOPHEN 500 MG PO TABS
1000.0000 mg | ORAL_TABLET | Freq: Once | ORAL | Status: AC
  Administered 2019-11-06: 1000 mg via ORAL

## 2019-11-06 MED ORDER — ACETAMINOPHEN 325 MG PO TABS
650.0000 mg | ORAL_TABLET | Freq: Four times a day (QID) | ORAL | Status: DC | PRN
  Administered 2019-11-07 – 2019-11-17 (×14): 650 mg via ORAL
  Filled 2019-11-06 (×14): qty 2

## 2019-11-06 MED ORDER — ONDANSETRON HCL 4 MG PO TABS
4.0000 mg | ORAL_TABLET | Freq: Four times a day (QID) | ORAL | Status: DC | PRN
  Filled 2019-11-06: qty 1

## 2019-11-06 MED ORDER — INSULIN ASPART 100 UNIT/ML ~~LOC~~ SOLN
0.0000 [IU] | Freq: Three times a day (TID) | SUBCUTANEOUS | Status: DC
  Administered 2019-11-07 – 2019-11-21 (×8): 3 [IU] via SUBCUTANEOUS
  Filled 2019-11-06 (×8): qty 1

## 2019-11-06 MED ORDER — ACETAMINOPHEN 650 MG RE SUPP
650.0000 mg | Freq: Four times a day (QID) | RECTAL | Status: DC | PRN
  Filled 2019-11-06: qty 1

## 2019-11-06 MED ORDER — ENOXAPARIN SODIUM 30 MG/0.3ML ~~LOC~~ SOLN
30.0000 mg | SUBCUTANEOUS | Status: DC
  Administered 2019-11-07 – 2019-11-08 (×2): 30 mg via SUBCUTANEOUS
  Filled 2019-11-06 (×2): qty 0.3

## 2019-11-06 MED ORDER — SODIUM CHLORIDE 0.9 % IV BOLUS
500.0000 mL | Freq: Once | INTRAVENOUS | Status: AC
  Administered 2019-11-06: 500 mL via INTRAVENOUS

## 2019-11-06 MED ORDER — INSULIN ASPART 100 UNIT/ML ~~LOC~~ SOLN: 0.0000 [IU] | Freq: Every day | SUBCUTANEOUS | Status: DC

## 2019-11-06 NOTE — ED Provider Notes (Signed)
Ongoing care signed to Dr. Nelva Bush.  Patient is pending bilateral knee x-rays for concerns of ongoing chronic knee pain, follow-up   Delman Kitten, MD 11/06/19 1541

## 2019-11-06 NOTE — Progress Notes (Signed)
Pt was admitted on the floor with no signs of distress. VSS stable except BP and HR 40 at 95/45. Pt fluid started at 100 ml/hr. Pt states plan of harming self, MD notified and place an order for 1:1 sitter. Pt ascom within pt reach and pt was educateda bout safety.Will continue to monitor.

## 2019-11-06 NOTE — ED Notes (Signed)
Pt sitting in chair at this time, chaplain had been in recently to talk with patient. Placement pending at this time.

## 2019-11-06 NOTE — ED Notes (Signed)
Dr. Jacqualine Code in room to evaluate pt's complain of knee pain.

## 2019-11-06 NOTE — ED Notes (Signed)
Pt was placed on bedside commode before transport. Small BM and unknown amount of urine charted at this time

## 2019-11-06 NOTE — ED Notes (Signed)
Pt assited to the bedside commode, pt utilized one person assist. Bedside commode placed very close to patient. Pt had an output of approx 10 ml's, wipe given to patient to clean self and a wipe to clean her hands, pt assisted back to the recliner and reclined back to previous patient and legs covered with a blanket, pt then stated that she had 2 pillows under her legs, pt reassured that no pillows were under her legs when assisted to the bedside commode, pt states that there was, a pillow was placed under legs bilat, pt then states that she had 2 pillows under her legs, one pillow under each leg and covered with a blanket, call bell placed to the right side armrest and oriented to its location, cont to monitor

## 2019-11-06 NOTE — H&P (Signed)
History and Physical    Melanie Young [id number] DOB: 03-Feb-1941 DOA: 11/03/2019  PCP: System, Pcp Not In   Patient coming from: nursing home  I have personally briefly reviewed patient's old medical records in [place]  Chief Complaint: hypotension  HPI: Melanie Young is a 79 y.o. female with medical history significant for surgical hypothyroidism, type 2 diabetes, CKD 3, asymptomatic sinus bradycardia, dementia and hypertension, who developed hypotension while being boarded in the emergency room awaiting inpatient psychiatric admission.  Patient initially presented to the emergency room on 11/03/2019 with chief complaint of aggressive behavior.  She also had a complaint of chest pain and was medically cleared after ruling out with 2 negative troponins.  Patient was being evaluated by the behavioral health service daily while awaiting placement however on 11/06/2019 on routine vital check, she was noted to be hypotensive with blood pressure 86/59.  She was also noted to be a bit more somnolent and less active than her usual .  Blood work was done revealing a creatinine of 2.56, up from 0.98 when she initially arrived to the emergency room on 11/03/2019.  She was otherwise asymptomatic.  She had been running a low heart rate in the 40s and 50s but this is chronic for patient going back to prior admissions.  Patient denies chest pain or shortness of breath.  She denied headache, visual disturbance, lightheadedness or palpitations. ED Course: Current vitals in the emergency room, temp 97.9, BP 86/59, pulse 43, RR 18 with O2 sat 97% on room air.  Blood work with creatinine of 2.56, up from 0.98.  TSH done on arrival a couple days prior was 7.36 with T4 of 1.5.  Other blood work unremarkable.  EKG with sinus bradycardia.  Hospitalist consult requested.  Review of Systems: Limited as patient is drowsy  Past Medical History:  Diagnosis Date  . Breast cancer ([place]) 1980's   right breast ca with  mastectomy  . Chronic kidney disease   . Dementia ([place])   . Depression   . Diabetes mellitus without complication ([place])   . Hypercholesteremia   . Hypertension   . Hypothyroidism   . Osteoporosis     Past Surgical History:  Procedure Laterality Date  . BREAST BIOPSY Left 2013   core needle bx, benign  . BREAST SURGERY    . CESAREAN SECTION    . COLONOSCOPY WITH PROPOFOL N/A 08/31/2018   Procedure: COLONOSCOPY WITH PROPOFOL;  Surgeon: Toledo, Benay Pike, MD;  Location: [facility] ENDOSCOPY;  Service: Endoscopy;  Laterality: N/A;  . MASTECTOMY Right 1973   breast ca  . THYROIDECTOMY       reports that she has never smoked. She has never used smokeless tobacco. She reports previous alcohol use. She reports that she does not use drugs.  No Known Allergies  Family History  Problem Relation Age of Onset  . Breast cancer Maternal Aunt   . Breast cancer Cousin        3 maternal cousins, 71's     Prior to Admission medications   Medication Sig Start Date End Date Taking? Authorizing Provider  ammonium lactate (LAC-HYDRIN) 12 % lotion Apply 1 application topically daily.   Yes [provider]  aspirin 81 MG tablet Take 1 tablet (81 mg total) by mouth daily. 09/12/19  Yes Lorella Nimrod, MD  atorvastatin (LIPITOR) 40 MG tablet Take 40 mg by mouth at bedtime.    Yes [provider]  busPIRone (BUSPAR) 10 MG tablet Take 10 mg  by mouth 2 (two) times daily.    Yes [provider]  calcium carbonate (TUMS - DOSED IN MG ELEMENTAL CALCIUM) 500 MG chewable tablet Chew 1 tablet by mouth 2 (two) times daily.   Yes [provider]  cholecalciferol (VITAMIN D3) 25 MCG (1000 UNIT) tablet Take 1,000 Units by mouth daily.   Yes [provider]  diclofenac Sodium (VOLTAREN) 1 % GEL Apply 2 g topically 2 (two) times daily.   Yes [provider]  lamoTRIgine (LAMICTAL) 200 MG tablet Take 200 mg by mouth daily.    Yes [provider]  levothyroxine  (SYNTHROID) 175 MCG tablet Take 1 tablet (175 mcg total) by mouth daily. 09/13/19  Yes Lorella Nimrod, MD  linagliptin (TRADJENTA) 5 MG TABS tablet Take 5 mg by mouth daily.   Yes [provider]  losartan (COZAAR) 50 MG tablet Take 50 mg by mouth daily. 07/25/17  Yes [provider]  LYRICA 100 MG capsule Take 100 mg by mouth 2 (two) times daily.   Yes [provider]  metFORMIN (GLUCOPHAGE) 500 MG tablet Take 750 mg by mouth daily.    Yes [provider]  nystatin (NYSTATIN) powder Apply 1 application topically 2 (two) times daily.   Yes [provider]  omeprazole (PRILOSEC) 20 MG capsule Take 20 mg by mouth daily.    Yes [provider]  oxybutynin (DITROPAN) 5 MG tablet Take 2.5 mg by mouth 2 (two) times daily.    Yes [provider]  oxyCODONE (OXY IR/ROXICODONE) 5 MG immediate release tablet Take 5 mg by mouth 2 (two) times daily.   Yes [provider]  QUEtiapine (SEROQUEL) 25 MG tablet Take 25 mg by mouth daily in the afternoon.   Yes [provider]  torsemide (DEMADEX) 20 MG tablet Take 20 mg by mouth daily.   Yes [provider]  acetaminophen (TYLENOL) 325 MG tablet Take 650 mg by mouth every 4 (four) hours as needed for mild pain.    [provider]  acetaminophen (TYLENOL) 650 MG CR tablet Take 650 mg by mouth 3 (three) times daily.    [provider]  alum & mag hydroxide-simeth (MAALOX/MYLANTA) 200-200-20 MG/5ML suspension Take 30 mLs by mouth every 6 (six) hours as needed for indigestion or heartburn.    [provider]  guaifenesin (ROBITUSSIN) 100 MG/5ML syrup Take 200 mg by mouth every 6 (six) hours as needed for cough.    [provider]  loperamide (IMODIUM) 2 MG capsule Take 2 mg by mouth 4 (four) times daily as needed for diarrhea or loose stools.     [provider]  magnesium hydroxide (MILK OF MAGNESIA) 400 MG/5ML suspension Take 30 mLs by  mouth at bedtime as needed for mild constipation or moderate constipation.    [provider]  neomycin-bacitracin-polymyxin (NEOSPORIN) ointment Apply 1 application topically as needed for wound care.    [provider]  ondansetron (ZOFRAN) 4 MG tablet Take 4 mg by mouth every 6 (six) hours as needed for nausea or vomiting.    [provider]  PROAIR HFA 108 (90 Base) MCG/ACT inhaler Inhale 1 puff into the lungs every 6 (six) hours as needed for wheezing or shortness of breath.     [provider]  QUEtiapine (SEROQUEL) 25 MG tablet Take 1 tablet (25 mg total) by mouth 2 (two) times daily for 30 doses. 11/03/19 11/18/19  Cristofano, Dorene Ar, MD  trolamine salicylate (ASPERCREME) 10 % cream  Apply 1 application topically 3 (three) times daily as needed for muscle pain.     [provider]    Physical Exam: Vitals:   11/06/19 1830 11/06/19 1834 11/06/19 1835 11/06/19 1915  BP: (!) 86/59   (!) 133/50  Pulse: (!) 43 (!) 43 (!) 47 (!) 55  Resp: 18   18  Temp:      TempSrc:      SpO2: 97% 94% 97% 100%  Weight:      Height:         Vitals:   11/06/19 1830 11/06/19 1834 11/06/19 1835 11/06/19 1915  BP: (!) 86/59   (!) 133/50  Pulse: (!) 43 (!) 43 (!) 47 (!) 55  Resp: 18   18  Temp:      TempSrc:      SpO2: 97% 94% 97% 100%  Weight:      Height:        Constitutional: Somewhat somnolent, oriented x2, not in any acute distress. Eyes: PERLA, EOMI, irises appear normal, anicteric sclera,  ENMT: external ears and nose appear normal, normal hearing             Lips appears normal, oropharynx mucosa, tongue, posterior pharynx appear normal  Neck: neck appears normal, no masses, normal ROM, no thyromegaly, no JVD  CVS: S1-S2 clear, no murmur rubs or gallops,  , no carotid bruits, pedal pulses palpable, No LE edema Respiratory:  clear to auscultation bilaterally, no wheezing, rales or rhonchi. Respiratory effort normal. No accessory muscle use.    Abdomen: soft nontender, nondistended, normal bowel sounds, no hepatosplenomegaly, no hernias Musculoskeletal: : no cyanosis, clubbing , no contractures or atrophy Neuro: Cranial nerves II-XII intact, sensation, reflexes normal, strength Psych: judgement and insight appear normal, stable mood and affect,  Skin: no rashes or lesions or ulcers, no induration or nodules   Labs on Admission: I have personally reviewed following labs and imaging studies  CBC: Recent Labs  Lab 10/31/19 1225 11/03/19 0802  WBC 5.7 5.6  NEUTROABS 2.1 2.3  HGB 11.5* 11.9*  HCT 36.4 37.7  MCV 87.1 87.3  PLT 193 [id number]*   Basic Metabolic Panel: Recent Labs  Lab 10/31/19 1350 11/03/19 0802 11/06/19 1840  NA 137 137 140  K 4.3 4.1 4.6  CL 102 105 106  CO2 26 22 25   GLUCOSE 86 114* 101*  BUN 15 19 31*  CREATININE 1.02* 0.98 2.56*  CALCIUM 9.0 9.2 9.0  MG 2.4  --   --    GFR: Estimated Creatinine Clearance: 21 mL/min (A) (by C-G formula based on SCr of 2.56 mg/dL (H)). Liver Function Tests: Recent Labs  Lab 10/31/19 1350  AST 21  ALT 15  ALKPHOS 102  BILITOT 1.1  PROT 7.3  ALBUMIN 3.8   No results for input(s): LIPASE, AMYLASE in the last 168 hours. No results for input(s): AMMONIA in the last 168 hours. Coagulation Profile: No results for input(s): INR, PROTIME in the last 168 hours. Cardiac Enzymes: No results for input(s): CKTOTAL, CKMB, CKMBINDEX, TROPONINI in the last 168 hours. BNP (last 3 results) No results for input(s): PROBNP in the last 8760 hours. HbA1C: No results for input(s): HGBA1C in the last 72 hours. CBG: Recent Labs  Lab 11/04/19 1114  GLUCAP 172*   Lipid Profile: No results for input(s): CHOL, HDL, LDLCALC, TRIG, CHOLHDL, LDLDIRECT in the last 72 hours. Thyroid Function Tests: Recent Labs    11/05/19 1048  TSH 7.360*  T4TOTAL 6.0   Anemia  Panel: No results for input(s): VITAMINB12, FOLATE, FERRITIN, TIBC, IRON, RETICCTPCT in the last 72 hours. Urine  analysis:    Component Value Date/Time   COLORURINE YELLOW (A) 10/31/2019 1513   APPEARANCEUR HAZY (A) 10/31/2019 1513   APPEARANCEUR Cloudy 11/09/2014 2357   LABSPEC 1.009 10/31/2019 1513   LABSPEC 1.013 11/09/2014 2357   PHURINE 7.0 10/31/2019 1513   GLUCOSEU NEGATIVE 10/31/2019 1513   GLUCOSEU Negative 11/09/2014 2357   HGBUR NEGATIVE 10/31/2019 1513   BILIRUBINUR NEGATIVE 10/31/2019 1513   BILIRUBINUR Negative 11/09/2014 [place] 10/31/2019 1513   PROTEINUR NEGATIVE 10/31/2019 1513   NITRITE NEGATIVE 10/31/2019 1513   LEUKOCYTESUR LARGE (A) 10/31/2019 1513   LEUKOCYTESUR 3+ 11/09/2014 2357    Radiological Exams on Admission: DG Knee 2 Views Left  Result Date: 11/06/2019 CLINICAL DATA:  Chronic left knee pain. EXAM: LEFT KNEE - 1-2 VIEW COMPARISON:  None. FINDINGS: No evidence of fracture, dislocation, or joint effusion. Severe narrowing of medial joint space is noted with osteophyte formation. Moderate narrowing of patellofemoral space is noted. Soft tissues are unremarkable. IMPRESSION: Severe degenerative joint disease. No acute abnormality seen in the left knee. Electronically Signed   By: Marijo Conception M.D.   On: 11/06/2019 16:16   DG Knee 2 Views Right  Result Date: 11/06/2019 CLINICAL DATA:  Multiple falls, chronic knee pain EXAM: RIGHT KNEE - 1-2 VIEW COMPARISON:  None. FINDINGS: Alignment is anatomic. There is no acute fracture. No joint effusion there are tricompartmental changes of osteoarthritis. IMPRESSION: Tricompartmental osteoarthritis. Electronically Signed   By: Macy Mis M.D.   On: 11/06/2019 16:15    EKG: Independently reviewed.   Assessment/Plan    Hypotension with history of essential hypertension -Suspect related to antihypertensives, poor oral intake -No clinical stigmata of infection but will continue to monitor -hold all home antihypertensives which include losartan, Demadex -IV hydration    AKI (acute kidney injury)  ([place])   CKD (chronic kidney disease) stage 3, GFR 30-59 ml/min -Suspect medication related, possible poor oral intake -Patient on multiple agents that can affect kidney function -Hold Metformin, torsemide, linagliptin, losartan, Lyrica and oxybutynin -IV hydration and continue to monitor renal function    Sinus bradycardia -Chronic sinus bradycardia in the 40s and 50s, asymptomatic -Patient was evaluated for presyncope related to symptomatic bradycardia back in December.  Had an unremarkable echocardiogram.  Pacemaker was not felt necessary at the time -Continue telemetry monitoring -Avoid AV nodal blockers -May consider atropine if heart rate dipping and staying below mid 48s -Cardiology consult    MDD (major depressive disorder), recurrent episode, severe ([place])   Dementia with behavioral disturbance ([place]) -Patient was being boarded in the emergency room pending inpatient psych admission -Behavioral health consult for continued management -Continue BuSpar, Lamictal and Seroquel as started by psych service in the emergency room -Following medical clearance patient will return to psych service    Type 2 diabetes mellitus with hyperlipidemia ([place]) -His recent A1c was 6.2 -Holding home Metformin and linagliptin due to renal function -Sliding scale insulin coverage    History of breast cancer -No acute concerns at this time    Post-surgical hypothyroidism -TSH on arrival was elevated at 7.36 with T4 of 1.5 -Continue levothyroxine 200 mg, increased from 175 right admission    DVT prophylaxis: Lovenox  Code Status: full code  Family Communication:  none  Disposition Plan: to behavioral health service Consults called: Psych, Renetta Chalk, NP, cardiology, Dr Clayborn Bigness Status:inp    Athena Masse  MD [facility]     11/06/2019, 8:04 PM

## 2019-11-06 NOTE — ED Notes (Signed)
Pt back to chair after going to bedside commode, pt tearful, pt c/o knee pain, dr. Jacqualine Code notified to get order for something for pain as well.  PT requires moderate amount of assistance to get to bedside commode due to knee pain.   At this time patient sitting in chair eating lunch.   Pt also would like bath after eating and informed her we would arrange for that.

## 2019-11-06 NOTE — Progress Notes (Addendum)
Pt on admission states that she plans of harming herself by taking pills. Notify prime. Will continue to monitor.  Update 2241: Ouma NP place order for 1 to 1 sitter. Will continue to monitor.  Update 2258: Pt Hr going from 41 and 42 and deep down to 39 and pt have a seroquel order. Notify prime. Will continue to monitor.  Update 2230 : Talked to MD Damita Dunnings and states to monitor pt at this time and notify MD if HR go down at 30. MD Damita Dunnings place consult to pharmacy about medication need to hold to help with [facility] control. Will continue to monitor.

## 2019-11-06 NOTE — ED Notes (Signed)
TTS has contacted [facility]. Representative confirms that they have not receive the pts referral This writer has refaxed clinical information. It is of note that there will be no female beds available until tomorrow morning.

## 2019-11-06 NOTE — Progress Notes (Signed)
   11/06/19 1104  Clinical Encounter Type  Visited With Patient  Visit Type Initial  Referral From Nurse  Consult/Referral To Chaplain  Spiritual Encounters  Spiritual Needs Prayer;Emotional;Other (Comment)  [place] received call from operator of request to visit patient in ED 2. Pt was sitting on hospital chair upon arrival. Was tearful upon arrival. Sat next to pt and provided pastoral care through normalizing feelings, validating emotions and prayer upon request. Pastoral visit was appreciated. Follow up visit is appreciated.

## 2019-11-06 NOTE — ED Provider Notes (Addendum)
-----------------------------------------   4:20 PM on 11/06/2019 -----------------------------------------  Bilateral knee x-ray shows significant arthritis but no acute fracture or injury.   Harvest Dark, MD 11/06/19 1620  ----------------------------------------- 6:40 PM on 11/06/2019 -----------------------------------------  On routine vitals patient was noted to be hypotensive 86/59.  When looking at her vitals this morning patient was around [id number] systolic and received her scheduled losartan.  I have since discontinued the patient's losartan.  We will dose 500 cc of IV.  Patient is noted to be more fatigued this evening.    Patient's creatinine has resulted at 2.5, increased from 0.9 upon arrival.  Consistent with acute kidney injury which is likely the cause of the patient's hypotension.  Given the patient's acute kidney injury with now hypotension we will admit to the hospital service, and have social work/psychiatry continue to look for placement.   Harvest Dark, MD 11/06/19 [phone]

## 2019-11-06 NOTE — ED Notes (Signed)
Pt sitting in chair at this time, requesting hot chocolate, cup of hot chocolate given to her with some ice to cool it down. Sitter at bedside.  Pt c/o knee pain.   Pt has flat affect, has been tearful off and on during the night per previous shift RN.

## 2019-11-06 NOTE — ED Notes (Signed)
PT in room to evaluate patient, pt unable to do much with PT d/t bilateral knee pain. Will inform Dr. Jacqualine Code.  Last knee x-rays seen in care everywhere was in 2015 at [facility]

## 2019-11-06 NOTE — Evaluation (Signed)
Physical Therapy Evaluation Patient Details Name: Melanie Young MRN: [id number] DOB: 12-29-1940 Today's Date: 11/06/2019   History of Present Illness  Patient is a 79 y/o F that returned to ED from facility due to AMS and bradycardia. She has been quite emotional, noted to have been tearful throughout much of the week she has been in ED. Per RN, she has been having difficulty with transfers and ambulation.  Clinical Impression  Patient is a 79 y/o F that presents to the ED with concerns of AMS and bradycardia. She has been quite emotional throughout this admission and is noted by RN staff to have declined in her ability to transfer and ambulate. She has been ambulatory in the past, it appears she has had several falls but patient is a very poor historian. At first she states she gets pains in her legs, her chest, and her arms. With any motion of the R knee she begins moaning, she is able to stand but unsafe to take any steps due to severe kyphotic posture adopted. Notable crepitus in R knee with transfers. Given her pain levels, discussed with RN that this was limiting patient's mobility. At this time she would potentially benefit from skilled PT services in a SNF setting at discharge to improve her functional mobility.     Follow Up Recommendations SNF    Equipment Recommendations  Rolling walker with 5" wheels    Recommendations for Other Services       Precautions / Restrictions Precautions Precautions: Fall Precaution Comments: Discussed audible crepitus of R knee with RN, patient's complaints of pain the R knee and pain with any/all motions of the R knee. No X-rays present at this time. Restrictions Weight Bearing Restrictions: No      Mobility  Bed Mobility               General bed mobility comments: Patient received in chair.  Transfers Overall transfer level: Needs assistance Equipment used: Rolling walker (2 wheeled) Transfers: Stand Pivot Transfers   Stand pivot  transfers: Min assist;Mod assist       General transfer comment: Patient begins moaning in increased level of pain with transfer sit to stand. No buckling evident and able to control descent into sitting. Attempted x 2, severe kyphotic posture adopted, unsafe/unable to ambulate or take steps with RW safely.  Ambulation/Gait                Stairs            Wheelchair Mobility    Modified Rankin (Stroke Patients Only)       Balance Overall balance assessment: Needs assistance   Sitting balance-Leahy Scale: Fair Sitting balance - Comments: Patient has severe kyphotic posture in sitting, holding her head while crying.   Standing balance support: Bilateral upper extremity supported Standing balance-Leahy Scale: Poor Standing balance comment: Severe kyphotic posture noted, unable to safely take any steps with RW.                             Pertinent Vitals/Pain Pain Assessment: Faces Faces Pain Scale: Hurts whole lot Pain Location: Patient begins whimpering in pain with WBing and [place] activities. She is able to control herself in both standing and sitting without buckling. Pain Descriptors / Indicators: Aching;Grimacing;Crying;Moaning Pain Intervention(s): Limited activity within patient's tolerance(Discussed with RN that patient's R knee pain is limiting any mobility she may be capable of. No imaging or testing seen in the  chart at this time.)    Home Living Family/patient expects to be discharged to:: Skilled nursing facility                 Additional Comments: Per old chart notes patient had been living at [place].    Prior Function           Comments: Per old note patient had been ambulating with RW. She is a poor historian but appears to have had multiple falls in the past.     Hand Dominance   Dominant Hand: Right    Extremity/Trunk Assessment   Upper Extremity Assessment Upper Extremity Assessment: Difficult to assess  due to impaired cognition    Lower Extremity Assessment Lower Extremity Assessment: Difficult to assess due to impaired cognition(Patient begins moaning in pain with any movement of the LEs. No buckling during standing/sitting but patient in obvious pain after attempt at mobility.)       Communication   Communication: No difficulties;Other (comment)(Patient is crying and tearful throughout session, begins to complain of increased R knee pain with any NWBing or WBing movements.)  Cognition Arousal/Alertness: Awake/alert Behavior During Therapy: (Patient is tearful and crying throughout session.) Overall Cognitive Status: No family/caregiver present to determine baseline cognitive functioning                                 General Comments: Patient tearful and crying throughout session, agreeable to therapy interventions. Noted to have increase in pain/moaning with any attempts at mobility of the LEs.      General Comments      Exercises     Assessment/Plan    PT Assessment Patient needs continued PT services  PT Problem List Decreased strength;Decreased mobility;Decreased safety awareness;Decreased range of motion;Decreased knowledge of precautions;Decreased activity tolerance;Decreased cognition;Cardiopulmonary status limiting activity;Decreased balance;Decreased knowledge of use of DME;Pain       PT Treatment Interventions Gait training;DME instruction;Therapeutic exercise;Balance training;Neuromuscular re-education;Cognitive remediation;Functional mobility training;Therapeutic activities;Patient/family education    PT Goals (Current goals can be found in the Care Plan section)  Acute Rehab PT Goals PT Goal Formulation: Patient unable to participate in goal setting Time For Goal Achievement: 11/20/19 Potential to Achieve Goals: Poor    Frequency Min 2X/week   Barriers to discharge Inaccessible home environment Patient is largely unable to ambulate any  significant distance at this point.    Co-evaluation               AM-PAC PT "6 Clicks" Mobility  Outcome Measure Help needed turning from your back to your side while in a flat bed without using bedrails?: A Lot Help needed moving from lying on your back to sitting on the side of a flat bed without using bedrails?: A Lot Help needed moving to and from a bed to a chair (including a wheelchair)?: A Lot Help needed standing up from a chair using your arms (e.g., wheelchair or bedside chair)?: A Lot Help needed to walk in hospital room?: A Lot Help needed climbing 3-5 steps with a railing? : Total 6 Click Score: 11    End of Session Equipment Utilized During Treatment: Gait belt Activity Tolerance: Patient limited by pain Patient left: in chair Nurse Communication: Mobility status;Other (comment)(Concerns about R knee pain and crepitus observed by therapist/) PT Visit Diagnosis: Difficulty in walking, not elsewhere classified (R26.2);History of falling (Z91.81)    Time: [id number] PT Time Calculation (min) (ACUTE ONLY): 18  min   Charges:   PT Evaluation $PT Eval Moderate Complexity: 1 Mod      Royce Macadamia PT, DPT, CSCS       11/06/2019, 3:16 PM

## 2019-11-06 NOTE — Consult Note (Signed)
[facility] Face-to-Face Psychiatry Consult   Reason for Consult:  Suicidal ideations Referring Physician:  EDP Patient Identification: Melanie Young MRN:  [id number] Principal Diagnosis: MDD (major depressive disorder), recurrent episode, severe ([place]) Diagnosis:  Principal Problem:   MDD (major depressive disorder), recurrent episode, severe ([place])   Total Time spent with patient: 30 minutes  Subjective:   11/06/2019: [name] Majestic to evaluate patient again today and she is still tearful she is unable to communicate today.  Patient sitter states that the patient has been very emotional today.  Patient does not her head of having any side effects due to starting Zoloft.  Labs were ordered yesterday for thyroid panel and patient's TSH was elevated more at 7.360.  Discussed this with Dr. Mallie Darting and the decision was made to recommend to increase the Synthroid to 200 mcg daily.  Discussed this with Dr. Jacqualine Code and he was in agreement for the Synthroid to be increased.  At this current time patient will continue Zoloft 50 mg daily along with her other medications as well as increasing her Synthroid to 200 mcg a day.  Melanie Young is a 79 y.o. female patient reports today that she still feels very depressed.  She states that isn't that she did not like the facility.  She reports that the facility is a very nice place and the staff there worked extremely hard to help all of the patients that are there.  She states that she just feels that she is a burden on them because she has started having more issues.  She states that she feels that she has a burden on everyone else and that there is no reason to live anymore.  She states that she is not even sure that another facility would better assist her or make her feel any better.  She reports that she feels that maybe been admitted to inpatient psychiatric unit could possibly improve her mood with the right medications and therapy.  She states that she does have people to talk to  at the facility and she is on medications for her depression and anxiety but it does not seem to be getting any better.  She continues to report multiple life stressors such as family, feeling abandoned by family, overwhelming staff at the facility, multiple medical issues, etc.  HPI:  Per EDP: 79 y.o. female  With PMHx dementia, DM, HTN, HLD, dementia, here with reported bradycardia. Per report from the facility, pt was found to be less responsive than usual and more confused. She was noted to be bradycardic to the 40s so EMS was called. EMS was told pt was on metoprolol but per [facility], is not. On arrival here, pt states her legs hurt but they have hurt "for a while." Denies any other complaints, though history limited 2/2 dementia. She does point to her chest as an area of pain on repeat questioning.  Patient is evaluated by this provider via face-to-face and have consulted with Dr. Mallie Darting.  Patient has a diagnosis of dementia but is a fairly good historian and only lacks specifics about events and medications.  Patient continues to appear very depressed and is very tearful during the evaluation.  Patient continues to state that she does not feel like she should continue living anymore because she is such a burden on people and that her life is never going to get any better.  Patient continues to meet inpatient psychiatric geropsychiatric treatment.  I have notified Dr. Jimmye Norman of the recommendations.  Past Psychiatric History:  Patient reports 1 self admission for psychiatric hospitalization years ago for depression.  She does report also having a suicide attempt years ago prior to her psychiatric admission where she attempted to overdose on medications.  She states that she is dealt with depression throughout most of her life.  She denies having any history of bipolar disorder or any type of psychotic features diagnosed or reported in the past.  Patient only reports history of depression and anxiety.  Risk  to Self:   Risk to Others:   Prior Inpatient Therapy:   Prior Outpatient Therapy:    Past Medical History:  Past Medical History:  Diagnosis Date  . Breast cancer ([place]) 1980's   right breast ca with mastectomy  . Chronic kidney disease   . Dementia ([place])   . Depression   . Diabetes mellitus without complication ([place])   . Hypercholesteremia   . Hypertension   . Hypothyroidism   . Osteoporosis     Past Surgical History:  Procedure Laterality Date  . BREAST BIOPSY Left 2013   core needle bx, benign  . BREAST SURGERY    . CESAREAN SECTION    . COLONOSCOPY WITH PROPOFOL N/A 08/31/2018   Procedure: COLONOSCOPY WITH PROPOFOL;  Surgeon: Toledo, Benay Pike, MD;  Location: [facility] ENDOSCOPY;  Service: Endoscopy;  Laterality: N/A;  . MASTECTOMY Right 1973   breast ca  . THYROIDECTOMY     Family History:  Family History  Problem Relation Age of Onset  . Breast cancer Maternal Aunt   . Breast cancer Cousin        3 maternal cousins, 55's   Family Psychiatric  History: None reported Social History:  Social History   Substance and Sexual Activity  Alcohol Use Not Currently     Social History   Substance and Sexual Activity  Drug Use Never    Social History   Socioeconomic History  . Marital status: Single    Spouse name: Not on file  . Number of children: Not on file  . Years of education: Not on file  . Highest education level: Not on file  Occupational History  . Not on file  Tobacco Use  . Smoking status: Never Smoker  . Smokeless tobacco: Never Used  Substance and Sexual Activity  . Alcohol use: Not Currently  . Drug use: Never  . Sexual activity: Not on file  Other Topics Concern  . Not on file  Social History Narrative  . Not on file   Social Determinants of Health   Financial Resource Strain:   . Difficulty of Paying Living Expenses:   Food Insecurity:   . Worried About Charity fundraiser in the Last Year:   . Arboriculturist in the Last Year:    Transportation Needs:   . Film/video editor (Medical):   Marland Kitchen Lack of Transportation (Non-Medical):   Physical Activity:   . Days of Exercise per Week:   . Minutes of Exercise per Session:   Stress:   . Feeling of Stress :   Social Connections:   . Frequency of Communication with Friends and Family:   . Frequency of Social Gatherings with Friends and Family:   . Attends Religious Services:   . Active Member of Clubs or Organizations:   . Attends Archivist Meetings:   Marland Kitchen Marital Status:    Additional Social History:    Allergies:  No Known Allergies  Labs:  Results for orders placed or performed during  the hospital encounter of 11/03/19 (from the past 48 hour(s))  Glucose, capillary     Status: Abnormal   Collection Time: 11/04/19 11:14 AM  Result Value Ref Range   Glucose-Capillary 172 (H) 70 - 99 mg/dL    Comment: Glucose reference range applies only to samples taken after fasting for at least 8 hours.   Comment 1 Notify RN    Comment 2 Document in Chart   Thyroid Panel With TSH     Status: Abnormal   Collection Time: 11/05/19 10:48 AM  Result Value Ref Range   TSH 7.360 (H) 0.450 - 4.500 uIU/mL   T4, Total 6.0 4.5 - 12.0 ug/dL   T3 Uptake Ratio 25 24 - 39 %   Free Thyroxine Index 1.5 1.2 - 4.9    Comment: (NOTE) Performed At: [facility] [place], [place] [id number] Rush Farmer MD [id number]     Current Facility-Administered Medications  Medication Dose Route Frequency Provider Last Rate Last Admin  . aspirin EC tablet 81 mg  81 mg Oral Daily Blake Divine, MD   81 mg at 11/05/19 [id number]  . atorvastatin (LIPITOR) tablet 40 mg  40 mg Oral QHS Blake Divine, MD   40 mg at 11/05/19 2314  . busPIRone (BUSPAR) tablet 10 mg  10 mg Oral BID Blake Divine, MD   10 mg at 11/05/19 2314  . lamoTRIgine (LAMICTAL) tablet 200 mg  200 mg Oral Daily Blake Divine, MD   200 mg at 11/05/19 [id number]  . levothyroxine (SYNTHROID) tablet 200  mcg  200 mcg Oral Q0600 Arnecia Ector, Lowry Ram, FNP      . linagliptin (TRADJENTA) tablet 5 mg  5 mg Oral Daily Blake Divine, MD   5 mg at 11/05/19 0946  . losartan (COZAAR) tablet 50 mg  50 mg Oral Daily Blake Divine, MD   50 mg at 11/05/19 [id number]  . metFORMIN (GLUCOPHAGE) tablet 750 mg  750 mg Oral Daily Blake Divine, MD   750 mg at 11/05/19 0942  . oxybutynin (DITROPAN) tablet 2.5 mg  2.5 mg Oral BID Blake Divine, MD   2.5 mg at 11/05/19 2315  . pantoprazole (PROTONIX) EC tablet 40 mg  40 mg Oral Daily Blake Divine, MD   40 mg at 11/05/19 [id number]  . pregabalin (LYRICA) capsule 100 mg  100 mg Oral BID Blake Divine, MD   100 mg at 11/05/19 2314  . QUEtiapine (SEROQUEL) tablet 25 mg  25 mg Oral BID Blake Divine, MD   25 mg at 11/05/19 2314  . sertraline (ZOLOFT) tablet 25 mg  25 mg Oral Daily Tanny Harnack, Lowry Ram, FNP   25 mg at 11/05/19 1039  . torsemide (DEMADEX) tablet 20 mg  20 mg Oral Daily Blake Divine, MD   20 mg at 11/05/19 [id number]   Current Outpatient Medications  Medication Sig Dispense Refill  . ammonium lactate (LAC-HYDRIN) 12 % lotion Apply 1 application topically daily.    Marland Kitchen aspirin 81 MG tablet Take 1 tablet (81 mg total) by mouth daily. 30 tablet 0  . atorvastatin (LIPITOR) 40 MG tablet Take 40 mg by mouth at bedtime.     . busPIRone (BUSPAR) 10 MG tablet Take 10 mg by mouth 2 (two) times daily.     . calcium carbonate (TUMS - DOSED IN MG ELEMENTAL CALCIUM) 500 MG chewable tablet Chew 1 tablet by mouth 2 (two) times daily.    . cholecalciferol (VITAMIN D3) 25 MCG (1000 UNIT) tablet Take 1,000 Units by mouth  daily.    . diclofenac Sodium (VOLTAREN) 1 % GEL Apply 2 g topically 2 (two) times daily.    Marland Kitchen lamoTRIgine (LAMICTAL) 200 MG tablet Take 200 mg by mouth daily.     Marland Kitchen levothyroxine (SYNTHROID) 175 MCG tablet Take 1 tablet (175 mcg total) by mouth daily.    Marland Kitchen linagliptin (TRADJENTA) 5 MG TABS tablet Take 5 mg by mouth daily.    Marland Kitchen losartan (COZAAR) 50 MG tablet Take 50 mg  by mouth daily.    Marland Kitchen LYRICA 100 MG capsule Take 100 mg by mouth 2 (two) times daily.    . metFORMIN (GLUCOPHAGE) 500 MG tablet Take 750 mg by mouth daily.     Marland Kitchen nystatin (NYSTATIN) powder Apply 1 application topically 2 (two) times daily.    Marland Kitchen omeprazole (PRILOSEC) 20 MG capsule Take 20 mg by mouth daily.     Marland Kitchen oxybutynin (DITROPAN) 5 MG tablet Take 2.5 mg by mouth 2 (two) times daily.     Marland Kitchen oxyCODONE (OXY IR/ROXICODONE) 5 MG immediate release tablet Take 5 mg by mouth 2 (two) times daily.    . QUEtiapine (SEROQUEL) 25 MG tablet Take 25 mg by mouth daily in the afternoon.    . torsemide (DEMADEX) 20 MG tablet Take 20 mg by mouth daily.    Marland Kitchen acetaminophen (TYLENOL) 325 MG tablet Take 650 mg by mouth every 4 (four) hours as needed for mild pain.    Marland Kitchen acetaminophen (TYLENOL) 650 MG CR tablet Take 650 mg by mouth 3 (three) times daily.    Marland Kitchen alum & mag hydroxide-simeth (MAALOX/MYLANTA) 200-200-20 MG/5ML suspension Take 30 mLs by mouth every 6 (six) hours as needed for indigestion or heartburn.    . guaifenesin (ROBITUSSIN) 100 MG/5ML syrup Take 200 mg by mouth every 6 (six) hours as needed for cough.    . loperamide (IMODIUM) 2 MG capsule Take 2 mg by mouth 4 (four) times daily as needed for diarrhea or loose stools.     . magnesium hydroxide (MILK OF MAGNESIA) 400 MG/5ML suspension Take 30 mLs by mouth at bedtime as needed for mild constipation or moderate constipation.    Marland Kitchen neomycin-bacitracin-polymyxin (NEOSPORIN) ointment Apply 1 application topically as needed for wound care.    . ondansetron (ZOFRAN) 4 MG tablet Take 4 mg by mouth every 6 (six) hours as needed for nausea or vomiting.    Marland Kitchen PROAIR HFA 108 (90 Base) MCG/ACT inhaler Inhale 1 puff into the lungs every 6 (six) hours as needed for wheezing or shortness of breath.     . QUEtiapine (SEROQUEL) 25 MG tablet Take 1 tablet (25 mg total) by mouth 2 (two) times daily for 30 doses. 30 tablet 0  . trolamine salicylate (ASPERCREME) 10 % cream  Apply 1 application topically 3 (three) times daily as needed for muscle pain.       Musculoskeletal: Strength & Muscle Tone: decreased Gait & Station: unsteady, needs assistance to ambulate Patient leans: N/A  Psychiatric Specialty Exam: Physical Exam  Nursing note and vitals reviewed. Constitutional: She is oriented to person, place, and time. She appears well-developed and well-nourished.  Respiratory: Effort normal.  Musculoskeletal:        General: Normal range of motion.  Neurological: She is alert and oriented to person, place, and time.  Skin: Skin is warm.  Psychiatric: She exhibits a depressed mood.    Review of Systems  Constitutional: Negative.   HENT: Negative.   Eyes: Negative.   Respiratory: Negative.   Cardiovascular:  Negative.   Gastrointestinal: Negative.   Genitourinary: Negative.   Musculoskeletal: Positive for arthralgias, back pain and myalgias.  Skin: Negative.   Neurological: Negative.   Psychiatric/Behavioral: Positive for dysphoric mood and suicidal ideas. The patient is nervous/anxious.     Blood pressure (!) 100/55, pulse (!) 56, temperature 98 F (36.7 C), temperature source Oral, resp. rate (!) 22, height 5\' 1"  (1.549 m), weight 105.8 kg, SpO2 100 %.Body mass index is 44.07 kg/m.  General Appearance: Disheveled  Eye Contact:  Fair  Speech:  Clear and Coherent and Normal Rate  Volume:  Decreased  Mood:  Depressed and Hopeless  Affect:  Depressed and Tearful  Thought Process:  Coherent and Descriptions of Associations: Intact  Orientation:  Full (Time, Place, and Person)  Thought Content:  WDL  Suicidal Thoughts:  No active plan, but feels like she would be better off if dead  Homicidal Thoughts:  No  Memory:  Immediate;   Fair Recent;   Fair Remote;   Fair  Judgement:  Fair  Insight:  Fair  Psychomotor Activity:  Decreased  Concentration:  Concentration: Fair  Recall:  [employer] of Knowledge:  Fair  Language:  Fair  Akathisia:  No   Handed:  Right  AIMS (if indicated):     Assets:  Desire for Improvement Financial Resources/Insurance Housing Resilience  ADL's:  Impaired  Cognition:  Impaired,  Mild  Sleep:        Treatment Plan Summary: Medication management  Continue Lamictal 200 mg p.o. twice daily, continue Seroquel 25 mg p.o. twice daily, continue BuSpar 10 mg p.o. twice daily Continue Zoloft 25 mg PO Daily Increase Synthroid to 200 mcg p.o. daily for hypothyroidism  Disposition: Recommend psychiatric Inpatient admission when medically cleared.  Francisco, FNP 11/06/2019 10:26 AM

## 2019-11-06 NOTE — ED Notes (Signed)
ED Provider at bedside. Informed him of low bp and HR

## 2019-11-06 NOTE — ED Notes (Signed)
Pt is tearful in room, pt states she feels like she has nobody at home who cares about her. This nurse and sitter attempted to verbally assist patient with emotions. Pt provided warm blanket and drink at this time. Pt has relaxed

## 2019-11-07 DIAGNOSIS — I952 Hypotension due to drugs: Secondary | ICD-10-CM | POA: Diagnosis not present

## 2019-11-07 DIAGNOSIS — N179 Acute kidney failure, unspecified: Principal | ICD-10-CM

## 2019-11-07 DIAGNOSIS — E1169 Type 2 diabetes mellitus with other specified complication: Secondary | ICD-10-CM

## 2019-11-07 DIAGNOSIS — R001 Bradycardia, unspecified: Secondary | ICD-10-CM

## 2019-11-07 DIAGNOSIS — I10 Essential (primary) hypertension: Secondary | ICD-10-CM

## 2019-11-07 DIAGNOSIS — Z853 Personal history of malignant neoplasm of breast: Secondary | ICD-10-CM

## 2019-11-07 DIAGNOSIS — E785 Hyperlipidemia, unspecified: Secondary | ICD-10-CM

## 2019-11-07 LAB — GLUCOSE, CAPILLARY
Glucose-Capillary: 98 mg/dL (ref 70–99)
Glucose-Capillary: 90 mg/dL (ref 70–99)
Glucose-Capillary: 125 mg/dL — ABNORMAL HIGH (ref 70–99)
Glucose-Capillary: 115 mg/dL — ABNORMAL HIGH (ref 70–99)

## 2019-11-07 LAB — BASIC METABOLIC PANEL
Anion gap: 7 (ref 5–15)
BUN: 32 mg/dL — ABNORMAL HIGH (ref 8–23)
CO2: 25 mmol/L (ref 22–32)
Calcium: 8.8 mg/dL — ABNORMAL LOW (ref 8.9–10.3)
Chloride: 109 mmol/L (ref 98–111)
Creatinine, Ser: 2.15 mg/dL — ABNORMAL HIGH (ref 0.44–1.00)
GFR calc Af Amer: 25 mL/min — ABNORMAL LOW (ref >60)
GFR calc non Af Amer: 22 mL/min — ABNORMAL LOW (ref >60)
Glucose, Bld: 138 mg/dL — ABNORMAL HIGH (ref 70–99)
Potassium: 4.2 mmol/L (ref 3.5–5.1)
Sodium: 141 mmol/L (ref 135–145)

## 2019-11-07 LAB — CBC
HCT: 33.5 % — ABNORMAL LOW (ref 36.0–46.0)
Hemoglobin: 10.8 g/dL — ABNORMAL LOW (ref 12.0–15.0)
MCH: 27.6 pg (ref 26.0–34.0)
MCHC: 32.2 g/dL (ref 30.0–36.0)
MCV: 85.5 fL (ref 80.0–100.0)
Platelets: 179 10*3/uL (ref 150–400)
RBC: 3.92 MIL/uL (ref 3.87–5.11)
RDW: 16.2 % — ABNORMAL HIGH (ref 11.5–15.5)
WBC: 5.2 10*3/uL (ref 4.0–10.5)
nRBC: 0 % (ref 0.0–0.2)

## 2019-11-07 LAB — TSH: TSH: 9.781 u[IU]/mL — ABNORMAL HIGH (ref 0.350–4.500)

## 2019-11-07 LAB — MRSA PCR SCREENING: MRSA by PCR: NEGATIVE

## 2019-11-07 MED ORDER — SODIUM CHLORIDE 0.9 % IV SOLN: INTRAVENOUS | Status: DC

## 2019-11-07 NOTE — Progress Notes (Signed)
Patient still meets inpatient criteria. Patient has been faxed out to the following facilities for review:   [facility]  CSW will continue to follow and assist with finding bed placement.   Domenic Schwab, MSW, LCSW-A Clinical Disposition Social Worker [facility]/TTS [phone]

## 2019-11-07 NOTE — Progress Notes (Signed)
[facility] visited pt. per chaplain referral for support for pt.  Pt.'s RN said she had been tearful and overwhelmed but also 'flat' earlier in the day when she talked w/Pt.  Pt. sitting up in bed w/ NT sitter in corner when [facility] arrived.  Pt. initially suspicious of [facility]: 'Why does everyone come in here and treat me strange when they don't even know me?'  Pt. elaborated, she feels like 'people' have been treating her as if there's something wrong with her and signaling to one another in front of her.  Pt. shared she lives @ [facility] something...') with other patients that seem to have more serious mental health issues.  She says her living situation --that she has not have space/time to herself and that she is surrounded by people with 'nerves' that make her nervous and anxious -- is a major source of her feeling depressed/anxious/agitated.  Pt. said her legs and knees hurt significantly when she moves and that pain medication has not been effective in controlling her pain.  She expressed feeling like 'I just want to be done' several times; it was unclear to [facility] if this was in reference to current hospitalization or the struggle to keep living.  Pt. shared, 'I have had a hard life' and she feels abandoned by her children and grandchildren who live nearby but who reportedly think she is inventing her health complaints and are too busy to visit her/take care of her.  Pt. said she once enjoyed getting to go on trips with friends but her inability to walk and liability to falls which her friends cannot rescue her from have made such trips impossible.   Pt. is aware of struggling with anxiety/depression ('nerves' as she termed it) and shared that she had once checked herself into a facility at [facility] for these struggles; she said treatment there and the peace and quiet she experienced made her feel significantly better. She is interested in similar treatment now if it is possible.  [facility] will attempt to relay  Pt.'s concerns/wishes to SW/Case Management.   In summary, Pt. would rather be discharged to a homeless shelter than back to her former nursing facility; she feels completely alone; she is in pain; she recognizes the need for treatment for her depression/anxiety.  Follow-up visit would be appreciated and valued; Pt. expressed sense of gratitude for CH's follow-up from last night's chaplain visit in ED.     11/07/19 1500  Clinical Encounter Type  Visited With Patient;Health care provider  Visit Type Initial;Psychological support;Spiritual support;Social support  Referral From Nurse;Chaplain  Consult/Referral To Chaplain;Social work  Recommendations SW follow up re: discharge plan  Spiritual Encounters  Spiritual Needs Prayer;Emotional  Stress Factors  Patient Stress Factors Exhausted;Family relationships;Health changes;Lack of caregivers;Lack of knowledge;Loss;Loss of control;Major life changes

## 2019-11-07 NOTE — Progress Notes (Addendum)
MEDICATION RELATED CONSULT NOTE - INITIAL   Pharmacy Consult for evaluate meds that may cause bradycardia Indication: bradycardia, HR dipped into 30's  No Known Allergies  Patient Measurements: Height: 5\' 1"  (154.9 cm) Weight: 233 lb 4 oz (105.8 kg) IBW/kg (Calculated) : 47.8  Vital Signs: Temp: 98.3 F (36.8 C) (03/21 2134) Temp Source: Oral (03/21 2134) BP: 95/48 (03/21 2314) Pulse Rate: 44 (03/21 2314)  Labs: Recent Labs    11/06/19 1840 11/06/19 2149  WBC  --  6.2  HGB  --  10.7*  HCT  --  34.7*  PLT  --  187  CREATININE 2.56*  --    Estimated Creatinine Clearance: 21 mL/min (A) (by C-G formula based on SCr of 2.56 mg/dL (H)).  Medical History: Past Medical History:  Diagnosis Date  . Breast cancer ([place]) 1980's   right breast ca with mastectomy  . Chronic kidney disease   . Dementia ([place])   . Depression   . Diabetes mellitus without complication ([place])   . Hypercholesteremia   . Hypertension   . Hypothyroidism   . Osteoporosis    Medications:  Scheduled:  . acetaminophen      . aspirin EC  81 mg Oral Daily  . atorvastatin  40 mg Oral QHS  . busPIRone  10 mg Oral BID  . enoxaparin (LOVENOX) injection  30 mg Subcutaneous Q24H  . insulin aspart  0-20 Units Subcutaneous TID WC  . insulin aspart  0-5 Units Subcutaneous QHS  . lamoTRIgine  200 mg Oral Daily  . levothyroxine  200 mcg Oral Q0600  . pantoprazole  40 mg Oral Daily  . QUEtiapine  25 mg Oral BID  . 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  Tonight HR dropped to 38 and nurse reported to pharmacy.  MD asked pharmacy to review meds and hold any meds that potentially may exacerbate bradycardia  Goal of Therapy:  Avoid adverse effects, DDI's  Plan:  Hold Seroquel and Buspar doses tonight and monitor HR Continue to monitor pt and f/u TSH as ordered by MD Review med list for any meds that may exacerbate bradycardia and adjust  Hart Robinsons A 11/07/2019,12:08 AM

## 2019-11-07 NOTE — Progress Notes (Signed)
PROGRESS NOTE    Melanie Young  [id number] DOB: 08-06-1941 DOA: 11/03/2019 PCP: System, Pcp Not In    Brief Narrative:  Melanie Young is a 79 y.o. female with medical history significant for surgical hypothyroidism, type 2 diabetes, CKD 3, asymptomatic sinus bradycardia, dementia and hypertension, who developed hypotension while being boarded in the emergency room awaiting inpatient psychiatric admission.  Patient initially presented to the emergency room on 11/03/2019 with chief complaint of aggressive behavior.  She also had a complaint of chest pain and was medically cleared after ruling out with 2 negative troponins.  Patient was being evaluated by the behavioral health service daily while awaiting placement however on 11/06/2019 on routine vital check, she was noted to be hypotensive with blood pressure 86/59.  She was also noted to be a bit more somnolent and less active than her usual .  Blood work was done revealing a creatinine of 2.56, up from 0.98 when she initially arrived .She had been running a low heart rate in the 40s and 50s but this is chronic for patient going back to prior admissions.    Consultants:   Psych, cardiology  Procedures:  Antimicrobials:      Subjective: Pt feeling sad. Poor eye contact at times. Denies dizziness while laying down, denies sob, cp or any other sx . Laying in bed  Objective: Vitals:   11/07/19 0358 11/07/19 0426 11/07/19 0743 11/07/19 1142  BP: 132/70  (!) 143/65 (!) 127/57  Pulse: (!) 53 (!) 51 (!) 48 (!) 46  Resp:   18 18  Temp:  98.1 F (36.7 C) 98.4 F (36.9 C) 98.2 F (36.8 C)  TempSrc:   Oral Oral  SpO2:  96% 100% 97%  Weight:      Height:        Intake/Output Summary (Last 24 hours) at 11/07/2019 1450 Last data filed at 11/07/2019 1345 Gross per 24 hour  Intake 1171.87 ml  Output 750 ml  Net 421.87 ml   Filed Weights   11/03/19 0755 11/04/19 0741  Weight: 105.8 kg 105.8 kg    Examination:  General exam:  Appears calm and comfortable , nad, poor eye contact Respiratory system: Clear to auscultation. Respiratory effort normal. Cardiovascular system: S1 & S2 heard,bardycardic, regular. No JVD, murmurs, rubs, gallops or clicks.  Gastrointestinal system: Abdomen is nondistended, soft and nontender.  Normal bowel sounds heard. Central nervous system: Alert and oriented. No focal neurological deficits. Extremities: no edema Skin: warm, dry Psychiatry: Judgement and insight appear normal. Mood & affect appropriate.     Data Reviewed: I have personally reviewed following labs and imaging studies  CBC: Recent Labs  Lab 11/03/19 0802 11/06/19 2149 11/07/19 0457  WBC 5.6 6.2 5.2  NEUTROABS 2.3  --   --   HGB 11.9* 10.7* 10.8*  HCT 37.7 34.7* 33.5*  MCV 87.3 89.2 85.5  PLT 101* 187 0000000   Basic Metabolic Panel: Recent Labs  Lab 11/03/19 0802 11/06/19 1840 11/07/19 0457  NA 137 140 141  K 4.1 4.6 4.2  CL 105 106 109  CO2 22 25 25   GLUCOSE 114* 101* 138*  BUN 19 31* 32*  CREATININE 0.98 2.56* 2.15*  CALCIUM 9.2 9.0 8.8*   GFR: Estimated Creatinine Clearance: 25 mL/min (A) (by C-G formula based on SCr of 2.15 mg/dL (H)). Liver Function Tests: No results for input(s): AST, ALT, ALKPHOS, BILITOT, PROT, ALBUMIN in the last 168 hours. No results for input(s): LIPASE, AMYLASE in the last 168 hours. No  results for input(s): AMMONIA in the last 168 hours. Coagulation Profile: No results for input(s): INR, PROTIME in the last 168 hours. Cardiac Enzymes: No results for input(s): CKTOTAL, CKMB, CKMBINDEX, TROPONINI in the last 168 hours. BNP (last 3 results) No results for input(s): PROBNP in the last 8760 hours. HbA1C: No results for input(s): HGBA1C in the last 72 hours. CBG: Recent Labs  Lab 11/04/19 1114 11/06/19 2208 11/07/19 0822 11/07/19 1143  GLUCAP 172* 99 98 90   Lipid Profile: No results for input(s): CHOL, HDL, LDLCALC, TRIG, CHOLHDL, LDLDIRECT in the last 72  hours. Thyroid Function Tests: Recent Labs    11/05/19 1048 11/06/19 1840  TSH 7.360* 9.781*  T4TOTAL 6.0  --    Anemia Panel: No results for input(s): VITAMINB12, FOLATE, FERRITIN, TIBC, IRON, RETICCTPCT in the last 72 hours. Sepsis Labs: No results for input(s): PROCALCITON, LATICACIDVEN in the last 168 hours.  Recent Results (from the past 240 hour(s))  Respiratory Panel by RT PCR (Flu A&B, Covid) - Nasopharyngeal Swab     Status: None   Collection Time: 11/03/19  3:24 PM   Specimen: Nasopharyngeal Swab  Result Value Ref Range Status   SARS Coronavirus 2 by RT PCR NEGATIVE NEGATIVE Final    Comment: (NOTE) SARS-CoV-2 target nucleic acids are NOT DETECTED. The SARS-CoV-2 RNA is generally detectable in upper respiratoy specimens during the acute phase of infection. The lowest concentration of SARS-CoV-2 viral copies this assay can detect is 131 copies/mL. A negative result does not preclude SARS-Cov-2 infection and should not be used as the sole basis for treatment or other patient management decisions. A negative result may occur with  improper specimen collection/handling, submission of specimen other than nasopharyngeal swab, presence of viral mutation(s) within the areas targeted by this assay, and inadequate number of viral copies (<131 copies/mL). A negative result must be combined with clinical observations, patient history, and epidemiological information. The expected result is Negative. Fact Sheet for Patients:  PinkCheek.be Fact Sheet for Healthcare Providers:  GravelBags.it This test is not yet ap proved or cleared by the [place] FDA and  has been authorized for detection and/or diagnosis of SARS-CoV-2 by FDA under an Emergency Use Authorization (EUA). This EUA will remain  in effect (meaning this test can be used) for the duration of the COVID-19 declaration under Section 564(b)(1) of the Act, 21  U.S.C. section 360bbb-3(b)(1), unless the authorization is terminated or revoked sooner.    Influenza A by PCR NEGATIVE NEGATIVE Final   Influenza B by PCR NEGATIVE NEGATIVE Final    Comment: (NOTE) The Xpert Xpress SARS-CoV-2/FLU/RSV assay is intended as an aid in  the diagnosis of influenza from Nasopharyngeal swab specimens and  should not be used as a sole basis for treatment. Nasal washings and  aspirates are unacceptable for Xpert Xpress SARS-CoV-2/FLU/RSV  testing. Fact Sheet for Patients: PinkCheek.be Fact Sheet for Healthcare Providers: GravelBags.it This test is not yet approved or cleared by the [place] FDA and  has been authorized for detection and/or diagnosis of SARS-CoV-2 by  FDA under an Emergency Use Authorization (EUA). This EUA will remain  in effect (meaning this test can be used) for the duration of the  Covid-19 declaration under Section 564(b)(1) of the Act, 21  U.S.C. section 360bbb-3(b)(1), unless the authorization is  terminated or revoked. Performed at [facility], [address], [place], [place] [address]   MRSA PCR Screening     Status: None   Collection Time: 11/07/19 12:47 AM  Specimen: Nasopharyngeal  Result Value Ref Range Status   MRSA by PCR NEGATIVE NEGATIVE Final    Comment:        The GeneXpert MRSA Assay (FDA approved for NASAL specimens only), is one component of a comprehensive MRSA colonization surveillance program. It is not intended to diagnose MRSA infection nor to guide or monitor treatment for MRSA infections. Performed at [facility], [address] [place], [place] [address]          Radiology Studies: DG Knee 2 Views Left  Result Date: 11/06/2019 CLINICAL DATA:  Chronic left knee pain. EXAM: LEFT KNEE - 1-2 VIEW COMPARISON:  None. FINDINGS: No evidence of fracture, dislocation, or joint effusion. Severe narrowing of medial joint  space is noted with osteophyte formation. Moderate narrowing of patellofemoral space is noted. Soft tissues are unremarkable. IMPRESSION: Severe degenerative joint disease. No acute abnormality seen in the left knee. Electronically Signed   By: Marijo Conception M.D.   On: 11/06/2019 16:16   DG Knee 2 Views Right  Result Date: 11/06/2019 CLINICAL DATA:  Multiple falls, chronic knee pain EXAM: RIGHT KNEE - 1-2 VIEW COMPARISON:  None. FINDINGS: Alignment is anatomic. There is no acute fracture. No joint effusion there are tricompartmental changes of osteoarthritis. IMPRESSION: Tricompartmental osteoarthritis. Electronically Signed   By: Macy Mis M.D.   On: 11/06/2019 16:15        Scheduled Meds: . aspirin EC  81 mg Oral Daily  . atorvastatin  40 mg Oral QHS  . busPIRone  10 mg Oral BID  . enoxaparin (LOVENOX) injection  30 mg Subcutaneous Q24H  . insulin aspart  0-20 Units Subcutaneous TID WC  . insulin aspart  0-5 Units Subcutaneous QHS  . lamoTRIgine  200 mg Oral Daily  . levothyroxine  200 mcg Oral Q0600  . pantoprazole  40 mg Oral Daily  . QUEtiapine  25 mg Oral BID  . sertraline  25 mg Oral Daily   Continuous Infusions: . sodium chloride      Assessment & Plan:   Principal Problem:   Hypotension Active Problems:   AKI (acute kidney injury) (HCC)   Sinus bradycardia   MDD (major depressive disorder), recurrent episode, severe (HCC)   Dementia with behavioral disturbance (HCC)   Type 2 diabetes mellitus with hyperlipidemia (HCC)   History of breast cancer   Essential hypertension   Post-surgical hypothyroidism   CKD (chronic kidney disease) stage 3, GFR 30-59 ml/min   Hypotension with history of essential hypertension -Suspect related to antihypertensives, poor oral intake -No clinical stigmata of infection but will continue to monitor -hold all home antihypertensives which include losartan, Demadex -Improving, continue IV hydration    AKI (acute kidney injury)  ([place])   CKD (chronic kidney disease) stage 3, GFR 30-59 ml/min -Suspect medication related, possible poor oral intake -Patient on multiple agents that can affect kidney function -Hold Metformin, torsemide, linagliptin, losartan, Lyrica and oxybutynin -improving , continue IV hydration and continue to monitor renal function    Sinus bradycardia -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 This is while in bed.  Will need to ambulate and ck hr Cards input appreciated, no cardia indication Avoid avn blockers or other potential meds to exacerbate it. -Patient was evaluated for presyncope related to symptomatic bradycardia back in December.  Had an unremarkable echocardiogram.  Pacemaker was not felt necessary at the time -Continue telemetry monitoring     MDD (major depressive disorder), recurrent episode, severe ([place])  Dementia with behavioral disturbance ([place]) -Patient was being boarded in the emergency room pending inpatient psych admission -Behavioral health consult for continued management -Continue BuSpar, Lamictal and Seroquel as started by psych service in the emergency room -Following medical clearance patient will return to psych service    Type 2 diabetes mellitus with hyperlipidemia ([place]) -His recent A1c was 6.2 -Holding home Metformin and linagliptin due to renal function -Sliding scale insulin coverage    History of breast cancer -No acute concerns at this time    Post-surgical hypothyroidism -TSH on arrival was elevated at 7.36 with T4 of 1.5 -Continue levothyroxine 200 mg, increased from 175 right admission  DVT prophylaxis: lovenox Code Status:full Family Communication: none at bedside Disposition Plan: behavioral health service Barrier: Pt still hypotensive, bradycardic and with AKI needing ivf for hydration. Possible d/c in 1-2 days      LOS: 0 days   Time spent: 45 min with >50% on coc    Nolberto Hanlon, MD [facility] Pager [phone]  If 7PM-7AM, please contact night-coverage [email or web address] Password [email or web address] 11/07/2019, 2:50 PM

## 2019-11-07 NOTE — Consult Note (Signed)
CARDIOLOGY CONSULT NOTE               Patient ID: Melanie Young MRN: [id number] DOB/AGE: September 09, 1940 79 y.o.  Admit date: 11/03/2019 Referring Physician: Judd Gaudier, MD  Primary Physician: none on file  Primary Cardiologist: Melanie Bill, MD  Reason for Consultation: bradycardia   HPI: Melanie Young is a 80 year old African-American female with PMH significant for hypertension, bradycardia, DM type II, hypercholesterolemia, hypothyroidism, CKD, depression, vascular dementia and osteoporosis who presented to [facility] ED on 11/03/2019 with complaints of aggressive behavior and mild chest pain in which was medically cleared with 2 negative troponins.  While being boarded in the emergency room awaiting inpatient psychiatric admission, the patient was found to be hypotensive and bradycardic on yesterday (11/06/19) with a bp of 86/59 and a HR in the 40's. The patient's creatinine was found to be 2.5 which was markedly higher than her initial 0.9 upon ED arrival. The patient was treated with a 500 cc IVF bolus, along with the discontinuation of her bp meds at that time and admitted to the telemetry unit for hypotension and acute kidney injury.   While on the unit, the patient continued to have hypotension and bradycardia with a HR as low as 38 bpm, thus Cardiology was consulted.   Upon assessment, the patient reports that she just "feels bad" and reports symptoms of postural dizziness and occasional chest pain that she describes as an intermittent "tightness" in her left and right chest area that lasts only for a few seconds. The patient denies any dyspnea, palpitations, unilateral weakness or N/V. The patient reports that she does have a h/o bradycardia and notes that she has had multiple syncopal episodes resulting in falls at home. She denies hitting her head or any LOC with her falls. The patient is noted to have underlying vascular dementia, therefore, her responses are questionable for accuracy.    Cardiac Diagnostic Studies: Echocardiogram 2D complete: (08/2019) IMPRESSIONS  1. Left ventricular ejection fraction, by visual estimation, is 55 to  60%. The left ventricle has normal function. There is no left ventricular  hypertrophy.  2. Definity contrast agent was given IV to delineate the left ventricular  endocardial borders.  3. The left ventricle has no regional wall motion abnormalities.  4. Global right ventricle has normal systolic function.The right  ventricular size is normal. No increase in right ventricular wall  thickness.  5. Left atrial size was normal.  6. Right atrial size was normal.  7. The mitral valve is normal in structure. Trivial mitral valve  regurgitation.  8. The tricuspid valve is normal in structure.  9. The tricuspid valve is normal in structure. Tricuspid valve  regurgitation is trivial.  10. The aortic valve is normal in structure. Aortic valve regurgitation is  not visualized.  11. The pulmonic valve was normal in structure. Pulmonic valve  regurgitation is not visualized.   Review of systems complete and found to be negative unless listed above     Past Medical History:  Diagnosis Date  . Breast cancer ([place]) 1980's   right breast ca with mastectomy  . Chronic kidney disease   . Dementia ([place])   . Depression   . Diabetes mellitus without complication ([place])   . Hypercholesteremia   . Hypertension   . Hypothyroidism   . Osteoporosis     Past Surgical History:  Procedure Laterality Date  . BREAST BIOPSY Left 2013   core needle bx, benign  . BREAST SURGERY    .  CESAREAN SECTION    . COLONOSCOPY WITH PROPOFOL N/A 08/31/2018   Procedure: COLONOSCOPY WITH PROPOFOL;  Surgeon: Toledo, Benay Pike, MD;  Location: [facility] ENDOSCOPY;  Service: Endoscopy;  Laterality: N/A;  . MASTECTOMY Right 1973   breast ca  . THYROIDECTOMY      Medications Prior to Admission  Medication Sig Dispense Refill Last Dose  . ammonium lactate  (LAC-HYDRIN) 12 % lotion Apply 1 application topically daily.   11/02/2019 at Unknown time  . aspirin 81 MG tablet Take 1 tablet (81 mg total) by mouth daily. 30 tablet 0 11/02/2019 at Unknown time  . atorvastatin (LIPITOR) 40 MG tablet Take 40 mg by mouth at bedtime.    11/02/2019 at Unknown time  . busPIRone (BUSPAR) 10 MG tablet Take 10 mg by mouth 2 (two) times daily.    11/02/2019 at Unknown time  . calcium carbonate (TUMS - DOSED IN MG ELEMENTAL CALCIUM) 500 MG chewable tablet Chew 1 tablet by mouth 2 (two) times daily.   11/02/2019 at Unknown time  . cholecalciferol (VITAMIN D3) 25 MCG (1000 UNIT) tablet Take 1,000 Units by mouth daily.   11/02/2019 at Unknown time  . diclofenac Sodium (VOLTAREN) 1 % GEL Apply 2 g topically 2 (two) times daily.   11/02/2019 at Unknown time  . lamoTRIgine (LAMICTAL) 200 MG tablet Take 200 mg by mouth daily.    11/02/2019 at Unknown time  . levothyroxine (SYNTHROID) 175 MCG tablet Take 1 tablet (175 mcg total) by mouth daily.   11/02/2019 at Unknown time  . linagliptin (TRADJENTA) 5 MG TABS tablet Take 5 mg by mouth daily.   11/02/2019 at Unknown time  . losartan (COZAAR) 50 MG tablet Take 50 mg by mouth daily.   11/02/2019 at Unknown time  . LYRICA 100 MG capsule Take 100 mg by mouth 2 (two) times daily.   11/02/2019 at Unknown time  . metFORMIN (GLUCOPHAGE) 500 MG tablet Take 750 mg by mouth daily.    11/02/2019 at Unknown time  . nystatin (NYSTATIN) powder Apply 1 application topically 2 (two) times daily.   11/02/2019 at Unknown time  . omeprazole (PRILOSEC) 20 MG capsule Take 20 mg by mouth daily.    11/02/2019 at Unknown time  . oxybutynin (DITROPAN) 5 MG tablet Take 2.5 mg by mouth 2 (two) times daily.    11/02/2019 at Unknown time  . oxyCODONE (OXY IR/ROXICODONE) 5 MG immediate release tablet Take 5 mg by mouth 2 (two) times daily.   11/02/2019 at Unknown time  . QUEtiapine (SEROQUEL) 25 MG tablet Take 25 mg by mouth daily in the afternoon.   11/02/2019 at Unknown time   . torsemide (DEMADEX) 20 MG tablet Take 20 mg by mouth daily.   11/02/2019 at Unknown time  . acetaminophen (TYLENOL) 325 MG tablet Take 650 mg by mouth every 4 (four) hours as needed for mild pain.   PRN at PRN  . acetaminophen (TYLENOL) 650 MG CR tablet Take 650 mg by mouth 3 (three) times daily.   PRN at PRN  . alum & mag hydroxide-simeth (MAALOX/MYLANTA) 200-200-20 MG/5ML suspension Take 30 mLs by mouth every 6 (six) hours as needed for indigestion or heartburn.   PRN at PRN  . guaifenesin (ROBITUSSIN) 100 MG/5ML syrup Take 200 mg by mouth every 6 (six) hours as needed for cough.   PRN at PRN  . loperamide (IMODIUM) 2 MG capsule Take 2 mg by mouth 4 (four) times daily as needed for diarrhea or loose stools.  PRN at PRN  . magnesium hydroxide (MILK OF MAGNESIA) 400 MG/5ML suspension Take 30 mLs by mouth at bedtime as needed for mild constipation or moderate constipation.   PRN at PRN  . neomycin-bacitracin-polymyxin (NEOSPORIN) ointment Apply 1 application topically as needed for wound care.   PRN at PRN  . ondansetron (ZOFRAN) 4 MG tablet Take 4 mg by mouth every 6 (six) hours as needed for nausea or vomiting.   PRN at PRN  . PROAIR HFA 108 (90 Base) MCG/ACT inhaler Inhale 1 puff into the lungs every 6 (six) hours as needed for wheezing or shortness of breath.    PRN at PRN  . trolamine salicylate (ASPERCREME) 10 % cream Apply 1 application topically 3 (three) times daily as needed for muscle pain.    PRN at PRN   Social History   Socioeconomic History  . Marital status: Single    Spouse name: Not on file  . Number of children: Not on file  . Years of education: Not on file  . Highest education level: Not on file  Occupational History  . Not on file  Tobacco Use  . Smoking status: Never Smoker  . Smokeless tobacco: Never Used  Substance and Sexual Activity  . Alcohol use: Not Currently  . Drug use: Never  . Sexual activity: Not on file  Other Topics Concern  . Not on file   Social History Narrative  . Not on file   Social Determinants of Health   Financial Resource Strain:   . Difficulty of Paying Living Expenses:   Food Insecurity:   . Worried About Charity fundraiser in the Last Year:   . Arboriculturist in the Last Year:   Transportation Needs:   . Film/video editor (Medical):   Marland Kitchen Lack of Transportation (Non-Medical):   Physical Activity:   . Days of Exercise per Week:   . Minutes of Exercise per Session:   Stress:   . Feeling of Stress :   Social Connections:   . Frequency of Communication with Friends and Family:   . Frequency of Social Gatherings with Friends and Family:   . Attends Religious Services:   . Active Member of Clubs or Organizations:   . Attends Archivist Meetings:   Marland Kitchen Marital Status:   Intimate Partner Violence:   . Fear of Current or Ex-Partner:   . Emotionally Abused:   Marland Kitchen Physically Abused:   . Sexually Abused:     Family History  Problem Relation Age of Onset  . Breast cancer Maternal Aunt   . Breast cancer Cousin        3 maternal cousins, 16's      Review of systems complete and found to be negative unless listed above      PHYSICAL EXAM  General: Well developed, well nourished, in no acute distress HEENT:  Normocephalic and atramatic Neck:  No JVD, symmetrical   Lungs: Clear bilaterally to auscultation. Heart: HRRR . Normal S1 and S2 without gallops or murmurs.  Abdomen: Bowel sounds are positive, abdomen soft and non-tender  Msk:  UTA Extremities: + 2 left pedal edema, +1 RLE, No clubbing or cyanosis   Neuro: Alert and oriented X 3. Psych:  Good affect, responds appropriately  Labs:   Lab Results  Component Value Date   WBC 5.2 11/07/2019   HGB 10.8 (L) 11/07/2019   HCT 33.5 (L) 11/07/2019   MCV 85.5 11/07/2019   PLT 179 11/07/2019  Recent Labs  Lab 10/31/19 1350 11/03/19 0802 11/07/19 0457  NA 137   < > 141  K 4.3   < > 4.2  CL 102   < > 109  CO2 26   < > 25  BUN  15   < > 32*  CREATININE 1.02*   < > 2.15*  CALCIUM 9.0   < > 8.8*  PROT 7.3  --   --   BILITOT 1.1  --   --   ALKPHOS 102  --   --   ALT 15  --   --   AST 21  --   --   GLUCOSE 86   < > 138*   < > = values in this interval not displayed.   Lab Results  Component Value Date   TROPONINI <0.03 08/10/2017   No results found for: CHOL No results found for: HDL No results found for: LDLCALC No results found for: TRIG No results found for: CHOLHDL No results found for: LDLDIRECT    Radiology: DG Chest 2 View  Result Date: 11/03/2019 CLINICAL DATA:  Chest pain, cough EXAM: CHEST - 2 VIEW COMPARISON:  10/31/2019 FINDINGS: Cardiomegaly. Probable mild interstitial edema, improved since prior study. Bibasilar atelectasis. No effusions or acute bony abnormality. IMPRESSION: Cardiomegaly with interstitial prominence, likely mild interstitial edema, improved since prior study. Bibasilar atelectasis. Electronically Signed   By: Rolm Baptise M.D.   On: 11/03/2019 08:42   DG Knee 2 Views Left  Result Date: 11/06/2019 CLINICAL DATA:  Chronic left knee pain. EXAM: LEFT KNEE - 1-2 VIEW COMPARISON:  None. FINDINGS: No evidence of fracture, dislocation, or joint effusion. Severe narrowing of medial joint space is noted with osteophyte formation. Moderate narrowing of patellofemoral space is noted. Soft tissues are unremarkable. IMPRESSION: Severe degenerative joint disease. No acute abnormality seen in the left knee. Electronically Signed   By: Marijo Conception M.D.   On: 11/06/2019 16:16   DG Knee 2 Views Right  Result Date: 11/06/2019 CLINICAL DATA:  Multiple falls, chronic knee pain EXAM: RIGHT KNEE - 1-2 VIEW COMPARISON:  None. FINDINGS: Alignment is anatomic. There is no acute fracture. No joint effusion there are tricompartmental changes of osteoarthritis. IMPRESSION: Tricompartmental osteoarthritis. Electronically Signed   By: Macy Mis M.D.   On: 11/06/2019 16:15   CT Head Wo  Contrast  Result Date: 10/31/2019 CLINICAL DATA:  Altered mental status, bradycardia EXAM: CT HEAD WITHOUT CONTRAST TECHNIQUE: Contiguous axial images were obtained from the base of the skull through the vertex without intravenous contrast. COMPARISON:  09/10/2019 FINDINGS: Brain: No evidence of acute infarction, hemorrhage, hydrocephalus, extra-axial collection or mass lesion/mass effect. Vascular: Atherosclerotic calcifications involving the large vessels of the skull base. No unexpected hyperdense vessel. Skull: Normal. Negative for fracture or focal lesion. Sinuses/Orbits: No acute finding. Other: None. IMPRESSION: No acute intracranial findings. Electronically Signed   By: Davina Poke D.O.   On: 10/31/2019 13:28   DG Chest Portable 1 View  Result Date: 10/31/2019 CLINICAL DATA:  Chest pain EXAM: PORTABLE CHEST 1 VIEW COMPARISON:  07/13/2019 FINDINGS: Bilateral interstitial thickening. No pleural effusion or pneumothorax. Stable cardiomediastinal silhouette. No aggressive osseous lesion. 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 IMPRESSION: Mild pulmonary vascular congestion. Electronically Signed   By: Kathreen Devoid   On: 10/31/2019 13:16    EKG: previous ECG revealed 1st HB with PAC's and a rate of 55bpm.   ASSESSMENT AND PLAN:  Sinus Bradycardia Hypotension with h/o  essential HTN AKI in the setting of CKD stage 3 DM Type II MDD     PLAN:  Sinus Bradycardia, unclear etiology, reasonably stable, chronic, patient reports postural dizziness only   -Permanent pacemaker not indicated at this time.  -Please continue to avoid beta blockers or the use of any medications that have the potential to exacerbate bradycardia.  -Recommendation: The patient should follow up with Dr. [name] as an outpatient 1 week after discharge for possible stress test and further evaluation regarding the indications for possible pacemaker placement in the near-future.   -We will  obtain EKG   Hypotension with h/o essential HTN, likely due to anti-hypertensive medications in the setting of decreased oral intake and AKI, reasonably stable, recurrent issue, most recent bp=143/65  -Recommendation: Continue to hold losartan at this time, monitor bp q4hrs, encourage oral intake and careful administration of IVF in the presence of BLE edema.   AKI in the setting of CKD stage 3, creatinine=2.5 (up from initial 0.9), likely due to medications and hypotension  -We are in agreement with current plan  -Recommendation: Consult nephrology if worsening symptoms and kidney functioning  DM Type II, reasonably stable, Hg A1c=6.2, Metformin and Linagliptin is being held due to AKI  -We are in agreement with current plan   MDD, reasonably stable, managed by Psychiatry  -Please continue plan as detailed by Psychiatry and avoid any medications that can further exacerbate bradycardia     Signed: Felicia Bloomquist , ACNPC-AG  11/07/2019, 10:20 AM

## 2019-11-08 DIAGNOSIS — E890 Postprocedural hypothyroidism: Secondary | ICD-10-CM | POA: Diagnosis present

## 2019-11-08 DIAGNOSIS — I952 Hypotension due to drugs: Secondary | ICD-10-CM | POA: Diagnosis present

## 2019-11-08 DIAGNOSIS — I129 Hypertensive chronic kidney disease with stage 1 through stage 4 chronic kidney disease, or unspecified chronic kidney disease: Secondary | ICD-10-CM | POA: Diagnosis present

## 2019-11-08 DIAGNOSIS — E1122 Type 2 diabetes mellitus with diabetic chronic kidney disease: Secondary | ICD-10-CM | POA: Diagnosis present

## 2019-11-08 DIAGNOSIS — M170 Bilateral primary osteoarthritis of knee: Secondary | ICD-10-CM | POA: Diagnosis present

## 2019-11-08 DIAGNOSIS — Z79890 Hormone replacement therapy (postmenopausal): Secondary | ICD-10-CM | POA: Diagnosis not present

## 2019-11-08 DIAGNOSIS — E860 Dehydration: Secondary | ICD-10-CM | POA: Diagnosis present

## 2019-11-08 DIAGNOSIS — E669 Obesity, unspecified: Secondary | ICD-10-CM | POA: Diagnosis present

## 2019-11-08 DIAGNOSIS — N1831 Chronic kidney disease, stage 3a: Secondary | ICD-10-CM | POA: Diagnosis present

## 2019-11-08 DIAGNOSIS — D696 Thrombocytopenia, unspecified: Secondary | ICD-10-CM | POA: Diagnosis present

## 2019-11-08 DIAGNOSIS — Z915 Personal history of self-harm: Secondary | ICD-10-CM | POA: Diagnosis not present

## 2019-11-08 DIAGNOSIS — Z9011 Acquired absence of right breast and nipple: Secondary | ICD-10-CM | POA: Diagnosis not present

## 2019-11-08 DIAGNOSIS — R001 Bradycardia, unspecified: Secondary | ICD-10-CM | POA: Diagnosis not present

## 2019-11-08 DIAGNOSIS — Z751 Person awaiting admission to adequate facility elsewhere: Secondary | ICD-10-CM | POA: Diagnosis not present

## 2019-11-08 DIAGNOSIS — F419 Anxiety disorder, unspecified: Secondary | ICD-10-CM | POA: Diagnosis present

## 2019-11-08 DIAGNOSIS — F332 Major depressive disorder, recurrent severe without psychotic features: Secondary | ICD-10-CM | POA: Diagnosis present

## 2019-11-08 DIAGNOSIS — E1169 Type 2 diabetes mellitus with other specified complication: Secondary | ICD-10-CM | POA: Diagnosis present

## 2019-11-08 DIAGNOSIS — Z853 Personal history of malignant neoplasm of breast: Secondary | ICD-10-CM | POA: Diagnosis not present

## 2019-11-08 DIAGNOSIS — Z79899 Other long term (current) drug therapy: Secondary | ICD-10-CM | POA: Diagnosis not present

## 2019-11-08 DIAGNOSIS — N179 Acute kidney failure, unspecified: Secondary | ICD-10-CM | POA: Diagnosis present

## 2019-11-08 DIAGNOSIS — E785 Hyperlipidemia, unspecified: Secondary | ICD-10-CM | POA: Diagnosis present

## 2019-11-08 DIAGNOSIS — T465X5A Adverse effect of other antihypertensive drugs, initial encounter: Secondary | ICD-10-CM | POA: Diagnosis present

## 2019-11-08 DIAGNOSIS — I10 Essential (primary) hypertension: Secondary | ICD-10-CM | POA: Diagnosis not present

## 2019-11-08 DIAGNOSIS — F0391 Unspecified dementia with behavioral disturbance: Secondary | ICD-10-CM | POA: Diagnosis not present

## 2019-11-08 DIAGNOSIS — F331 Major depressive disorder, recurrent, moderate: Secondary | ICD-10-CM | POA: Diagnosis not present

## 2019-11-08 DIAGNOSIS — Z6841 Body Mass Index (BMI) 40.0 and over, adult: Secondary | ICD-10-CM | POA: Diagnosis not present

## 2019-11-08 DIAGNOSIS — F0151 Vascular dementia with behavioral disturbance: Secondary | ICD-10-CM | POA: Diagnosis present

## 2019-11-08 DIAGNOSIS — Z20822 Contact with and (suspected) exposure to covid-19: Secondary | ICD-10-CM | POA: Diagnosis present

## 2019-11-08 DIAGNOSIS — R45851 Suicidal ideations: Secondary | ICD-10-CM | POA: Diagnosis present

## 2019-11-08 LAB — GLUCOSE, CAPILLARY
Glucose-Capillary: 102 mg/dL — ABNORMAL HIGH (ref 70–99)
Glucose-Capillary: 118 mg/dL — ABNORMAL HIGH (ref 70–99)
Glucose-Capillary: 118 mg/dL — ABNORMAL HIGH (ref 70–99)
Glucose-Capillary: 127 mg/dL — ABNORMAL HIGH (ref 70–99)

## 2019-11-08 LAB — BASIC METABOLIC PANEL
Anion gap: 7 (ref 5–15)
BUN: 19 mg/dL (ref 8–23)
CO2: 24 mmol/L (ref 22–32)
Calcium: 8.8 mg/dL — ABNORMAL LOW (ref 8.9–10.3)
Chloride: 109 mmol/L (ref 98–111)
Creatinine, Ser: 1.11 mg/dL — ABNORMAL HIGH (ref 0.44–1.00)
GFR calc Af Amer: 56 mL/min — ABNORMAL LOW (ref >60)
GFR calc non Af Amer: 48 mL/min — ABNORMAL LOW (ref >60)
Glucose, Bld: 127 mg/dL — ABNORMAL HIGH (ref 70–99)
Potassium: 4.2 mmol/L (ref 3.5–5.1)
Sodium: 140 mmol/L (ref 135–145)

## 2019-11-08 MED ORDER — AMLODIPINE BESYLATE 5 MG PO TABS
5.0000 mg | ORAL_TABLET | Freq: Every day | ORAL | Status: DC
  Administered 2019-11-08 – 2019-11-18 (×11): 5 mg via ORAL
  Filled 2019-11-08 (×11): qty 1

## 2019-11-08 MED ORDER — ENOXAPARIN SODIUM 40 MG/0.4ML ~~LOC~~ SOLN
40.0000 mg | SUBCUTANEOUS | Status: DC
  Administered 2019-11-09 – 2019-11-15 (×7): 40 mg via SUBCUTANEOUS
  Filled 2019-11-08 (×7): qty 0.4

## 2019-11-08 NOTE — Progress Notes (Signed)
PHARMACIST - PHYSICIAN COMMUNICATION  CONCERNING:  Enoxaparin (Lovenox) for DVT Prophylaxis    RECOMMENDATION: Patient was prescribed enoxaprin 40mg  q24 hours for VTE prophylaxis.   Filed Weights   11/03/19 0755 11/04/19 0741  Weight: 233 lb 4 oz (105.8 kg) 233 lb 4 oz (105.8 kg)    Body mass index is 44.07 kg/m.  Estimated Creatinine Clearance: 48.3 mL/min (A) (by C-G formula based on SCr of 1.11 mg/dL (H)).   Patient is candidate for enoxaparin 40mg  every 24 hours based on CrCl >80ml/min or Weight  > 45kg for women or >57kg for men   DESCRIPTION: Pharmacy has adjusted enoxaparin dose per [facility] policy.  Patient is now receiving enoxaparin 40mg  every 24 hours.  Rowland Lathe, PharmD Clinical Pharmacist  11/08/2019 3:27 PM

## 2019-11-08 NOTE — Progress Notes (Signed)
Patient refusing new PIV at this time. Appears confused / paranoid. Will inform physician. Melanie Young [facility]

## 2019-11-08 NOTE — Progress Notes (Signed)
Nutrition Brief Note  Patient identified on the Malnutrition Screening Tool (MST) Report  79 year old African-American female with PMH significant for hypertension, bradycardia, DM type II, hypercholesterolemia, hypothyroidism, CKD, depression, vascular dementia and osteoporosis who presented to [facility] ED on 11/03/2019 with complaints of aggressive behavior and mild chest pain in which was medically cleared with 2 negative troponins.  While being boarded in the emergency room awaiting inpatient psychiatric admission, the patient was found to be hypotensive and bradycardic  Wt Readings from Last 15 Encounters:  11/04/19 105.8 kg  10/31/19 105.8 kg  09/10/19 105.8 kg  07/13/19 108.9 kg  06/02/19 108.9 kg  08/31/18 87.1 kg  08/10/17 112.8 kg  09/20/16 103 kg  10/19/15 103.4 kg  07/19/15 97.5 kg    Body mass index is 44.07 kg/m. Patient meets criteria for morbid obesity based on current BMI.   Current diet order is HH/CHO modified, patient is consuming approximately 75-100% of meals at this time. Labs and medications reviewed.   No nutrition interventions warranted at this time. If nutrition issues arise, please consult RD.   Koleen Distance MS, RD, LDN Contact information available in Amion

## 2019-11-08 NOTE — Progress Notes (Signed)
PT Cancellation Note  Patient Details Name: Melanie Young MRN: [id number] DOB: 1940-09-22   Cancelled Treatment:     PT attempt, Pt refused. She reports BLE (knee) pain and is unwilling to perform OOB activity. Therapist reviewed there ex to promote strengthening while in bed. Acute PT will continue to follow per POC.    Willette Pa 11/08/2019, 4:34 PM

## 2019-11-08 NOTE — Progress Notes (Signed)
[facility]  Patient Description: Melanie Young is a 80 year old African-American female with PMH significant for hypertension, bradycardia, DM type II, hypercholesterolemia, hypothyroidism, CKD, depression, vascular dementia and osteoporosis who presented to [facility] ED on 11/03/2019 with complaints of aggressive behavior and mild chest pain in which was medically cleared with 2 negative troponins.  While being boarded in the emergency room awaiting inpatient psychiatric admission, the patient was found to be hypotensive and bradycardic, thus she was admitted to the telemetry floor for medical care.   SUBJECTIVE: The patient reports to be feeling slightly better than she did on yesterday; however, she states that she feels as if she is under a lot of stress and expresses her concerns about returning to the SNF where she lived prior to admission. The patient denies all chest pain, dyspnea, papitations or dizziness at this time. When questioned about her mobility, the patient states that it is difficult for her to walk due to right knee pain.   OBJECTIVE: The patient appears fairly well, without any signs of acute distress and the safety sitter remains at the bedside for 1 on 1 observation. The patient continue to have SB on the monitor with a HR of 52 bpm.   Vitals:   11/07/19 1814 11/07/19 1943 11/08/19 0612 11/08/19 0730  BP: 138/72 (!) 157/77 (!) 164/67 (!) 148/66  Pulse:  (!) 48 (!) 48 (!) 47  Resp:  18 18 16   Temp:  98.6 F (37 C) 98.6 F (37 C) 98.3 F (36.8 C)  TempSrc:  Oral  Oral  SpO2:  94% 97% 98%  Weight:      Height:         Intake/Output Summary (Last 24 hours) at 11/08/2019 1239 Last data filed at 11/08/2019 [id number] Gross per 24 hour  Intake 1680 ml  Output 651 ml  Net 1029 ml      PHYSICAL EXAM  General: Well developed, well nourished, in no acute distress HEENT:  Normocephalic and atramatic Neck:  No JVD, symmetrical  Lungs: Clear bilaterally to auscultation Heart: HRRR  . Normal S1 and S2 without gallops or murmurs.  Abdomen: Bowel sounds are positive, abdomen soft and non-tender  Msk: UTA at this time  Extremities: +1 bilateral pedal edema. No clubbing or cyanosis.   Neuro: Alert and oriented X 2-3 Psych:  Good affect, responds appropriately at times    LABS: Basic Metabolic Panel: Recent Labs    11/07/19 0457 11/08/19 0850  NA 141 140  K 4.2 4.2  CL 109 109  CO2 25 24  GLUCOSE 138* 127*  BUN 32* 19  CREATININE 2.15* 1.11*  CALCIUM 8.8* 8.8*   Liver Function Tests: No results for input(s): AST, ALT, ALKPHOS, BILITOT, PROT, ALBUMIN in the last 72 hours. No results for input(s): LIPASE, AMYLASE in the last 72 hours. CBC: Recent Labs    11/06/19 2149 11/07/19 0457  WBC 6.2 5.2  HGB 10.7* 10.8*  HCT 34.7* 33.5*  MCV 89.2 85.5  PLT 187 179   Cardiac Enzymes: No results for input(s): CKTOTAL, CKMB, CKMBINDEX, TROPONINI in the last 72 hours. BNP: Invalid input(s): POCBNP D-Dimer: No results for input(s): DDIMER in the last 72 hours. Hemoglobin A1C: No results for input(s): HGBA1C in the last 72 hours. Fasting Lipid Panel: No results for input(s): CHOL, HDL, LDLCALC, TRIG, CHOLHDL, LDLDIRECT in the last 72 hours. Thyroid Function Tests: Recent Labs    11/06/19 1840  TSH 9.781*   Anemia Panel: No results for input(s): VITAMINB12, FOLATE, FERRITIN,  TIBC, IRON, RETICCTPCT in the last 72 hours.  DG Knee 2 Views Left  Result Date: 11/06/2019 CLINICAL DATA:  Chronic left knee pain. EXAM: LEFT KNEE - 1-2 VIEW COMPARISON:  None. FINDINGS: No evidence of fracture, dislocation, or joint effusion. Severe narrowing of medial joint space is noted with osteophyte formation. Moderate narrowing of patellofemoral space is noted. Soft tissues are unremarkable. IMPRESSION: Severe degenerative joint disease. No acute abnormality seen in the left knee. Electronically Signed   By: Marijo Conception M.D.   On: 11/06/2019 16:16   DG Knee 2 Views Right   Result Date: 11/06/2019 CLINICAL DATA:  Multiple falls, chronic knee pain EXAM: RIGHT KNEE - 1-2 VIEW COMPARISON:  None. FINDINGS: Alignment is anatomic. There is no acute fracture. No joint effusion there are tricompartmental changes of osteoarthritis. IMPRESSION: Tricompartmental osteoarthritis. Electronically Signed   By: Macy Mis M.D.   On: 11/06/2019 16:15     Echo: Echocardiogram 2D complete: (08/2019) IMPRESSIONS  1. Left ventricular ejection fraction, by visual estimation, is 55 to  60%. The left ventricle has normal function. There is no left ventricular  hypertrophy.  2. Definity contrast agent was given IV to delineate the left ventricular  endocardial borders.  3. The left ventricle has no regional wall motion abnormalities.  4. Global right ventricle has normal systolic function.The right  ventricular size is normal. No increase in right ventricular wall  thickness.  5. Left atrial size was normal.  6. Right atrial size was normal.  7. The mitral valve is normal in structure. Trivial mitral valve  regurgitation.  8. The tricuspid valve is normal in structure.  9. The tricuspid valve is normal in structure. Tricuspid valve  regurgitation is trivial.  10. The aortic valve is normal in structure. Aortic valve regurgitation is  not visualized.  11. The pulmonic valve was normal in structure. Pulmonic valve  regurgitation is not visualized.   TELEMETRY: SB with PAC's and HR of 52bpm   ASSESSMENT AND PLAN:  Principal Problem:   Hypotension Active Problems:   Acute kidney injury (HCC)   Sinus bradycardia   MDD (major depressive disorder), recurrent episode, severe (HCC)   Dementia with behavioral disturbance (HCC)   Type 2 diabetes mellitus with hyperlipidemia (HCC)   History of breast cancer   Essential hypertension   Post-surgical hypothyroidism   CKD (chronic kidney disease) stage 3, GFR 30-59 ml/min    PLAN:   Sinus Bradycardia, unclear  etiology, reasonably stable, asymptomatic              -Permanent pacemaker not indicated at this time.             -Please continue to avoid beta blockers or the use of any medications that have the potential to exacerbate bradycardia.             -Recommendation: The patient should follow up with Dr. [name] as an outpatient 1 week after discharge for possible stress test and further evaluation regarding the indications for possible pacemaker placement in the near-future.              -ECG has been ordered, but has yet to be performed   Hypotension with h/o essential HTN, likely due to anti-hypertensive medications in the setting of decreased oral intake and AKI, reasonably stable, recurrent issue, most recent bp=148/66             -Recommendation: Restart losartan 25mg  at this time, monitor bp q4hrs, encourage oral intake and careful  administration of IVF in the presence of BLE edema.   AKI in the setting of CKD stage 3, creatinine=1.11/Bun=19, likely due to medications and hypotension             -We are in agreement with current plan             -Recommendation: Consult nephrology if worsening symptoms and kidney functioning  DM Type II, reasonably stable, Hg A1c=6.2, Metformin and Linagliptin is being held due to AKI             -We are in agreement with current plan              MDD, reasonably stable, managed by Psychiatry             -Please continue plan as detailed by Psychiatry and avoid any medications that can further exacerbate bradycardia    Jeshua Ransford, ACNPC-AG  11/08/2019 12:39 PM

## 2019-11-08 NOTE — Plan of Care (Signed)
  Problem: Education: Goal: Knowledge of General Education information will improve Description: Including pain rating scale, medication(s)/side effects and non-pharmacologic comfort measures Outcome: Not Progressing Note: Patient waiting for either inpatient or OP psych floor transfer. Will continue to monitor for eventual discharge status. Melanie Young [facility]

## 2019-11-08 NOTE — Progress Notes (Signed)
PROGRESS NOTE    Melanie Young  [id number] DOB: 1941-06-30 DOA: 11/03/2019 PCP: System, Pcp Not In    Brief Narrative:  Melanie Young is a 79 y.o. female with medical history significant for surgical hypothyroidism, type 2 diabetes, CKD 3, asymptomatic sinus bradycardia, dementia and hypertension, who developed hypotension while being boarded in the emergency room awaiting inpatient psychiatric admission.  Patient initially presented to the emergency room on 11/03/2019 with chief complaint of aggressive behavior.  She also had a complaint of chest pain and was medically cleared after ruling out with 2 negative troponins.  Patient was being evaluated by the behavioral health service daily while awaiting placement however on 11/06/2019 on routine vital check, she was noted to be hypotensive with blood pressure 86/59.  She was also noted to be a bit more somnolent and less active than her usual .  Blood work was done revealing a creatinine of 2.56, up from 0.98 when she initially arrived .She had been running a low heart rate in the 40s and 50s but this is chronic for patient going back to prior admissions.    Consultants:   Psych, cardiology  Procedures:  Antimicrobials:      Subjective: Patient is more talkative today.  She is worried about her "son where she cannot find them.  Otherwise denies any dizziness, lightheadedness,.  More interactive.  Objective: Vitals:   11/07/19 1814 11/07/19 1943 11/08/19 0612 11/08/19 0730  BP: 138/72 (!) 157/77 (!) 164/67 (!) 148/66  Pulse:  (!) 48 (!) 48 (!) 47  Resp:  18 18 16   Temp:  98.6 F (37 C) 98.6 F (37 C) 98.3 F (36.8 C)  TempSrc:  Oral  Oral  SpO2:  94% 97% 98%  Weight:      Height:        Intake/Output Summary (Last 24 hours) at 11/08/2019 1543 Last data filed at 11/08/2019 [id number] Gross per 24 hour  Intake 1440 ml  Output 651 ml  Net 789 ml   Filed Weights   11/03/19 0755 11/04/19 0741  Weight: 105.8 kg 105.8 kg     Examination:  General exam: Appears calm and comfortable , nad Respiratory system: Clear to auscultation. Poor respiratory effort normal. Cardiovascular system: S1 & S2 heard,bardycardic, regular. No JVD, murmurs, rubs, gallops or clicks.  Gastrointestinal system: Abdomen is nondistended, soft and nontender.  Normal bowel sounds heard. Central nervous system: Alert and oriented. Grossly intact Extremities: trace pedal b/l edema Skin: warm, dry Psychiatry: . Mood & affect appropriate in cutting setting.     Data Reviewed: I have personally reviewed following labs and imaging studies  CBC: Recent Labs  Lab 11/03/19 0802 11/06/19 2149 11/07/19 0457  WBC 5.6 6.2 5.2  NEUTROABS 2.3  --   --   HGB 11.9* 10.7* 10.8*  HCT 37.7 34.7* 33.5*  MCV 87.3 89.2 85.5  PLT 101* 187 0000000   Basic Metabolic Panel: Recent Labs  Lab 11/03/19 0802 11/06/19 1840 11/07/19 0457 11/08/19 0850  NA 137 140 141 140  K 4.1 4.6 4.2 4.2  CL 105 106 109 109  CO2 22 25 25 24   GLUCOSE 114* 101* 138* 127*  BUN 19 31* 32* 19  CREATININE 0.98 2.56* 2.15* 1.11*  CALCIUM 9.2 9.0 8.8* 8.8*   GFR: Estimated Creatinine Clearance: 48.3 mL/min (A) (by C-G formula based on SCr of 1.11 mg/dL (H)). Liver Function Tests: No results for input(s): AST, ALT, ALKPHOS, BILITOT, PROT, ALBUMIN in the last 168 hours. No results for  input(s): LIPASE, AMYLASE in the last 168 hours. No results for input(s): AMMONIA in the last 168 hours. Coagulation Profile: No results for input(s): INR, PROTIME in the last 168 hours. Cardiac Enzymes: No results for input(s): CKTOTAL, CKMB, CKMBINDEX, TROPONINI in the last 168 hours. BNP (last 3 results) No results for input(s): PROBNP in the last 8760 hours. HbA1C: No results for input(s): HGBA1C in the last 72 hours. CBG: Recent Labs  Lab 11/07/19 1143 11/07/19 1623 11/07/19 2246 11/08/19 0725 11/08/19 1142  GLUCAP 90 125* 115* 102* 118*   Lipid Profile: No results for  input(s): CHOL, HDL, LDLCALC, TRIG, CHOLHDL, LDLDIRECT in the last 72 hours. Thyroid Function Tests: Recent Labs    11/06/19 1840  TSH 9.781*   Anemia Panel: No results for input(s): VITAMINB12, FOLATE, FERRITIN, TIBC, IRON, RETICCTPCT in the last 72 hours. Sepsis Labs: No results for input(s): PROCALCITON, LATICACIDVEN in the last 168 hours.  Recent Results (from the past 240 hour(s))  Respiratory Panel by RT PCR (Flu A&B, Covid) - Nasopharyngeal Swab     Status: None   Collection Time: 11/03/19  3:24 PM   Specimen: Nasopharyngeal Swab  Result Value Ref Range Status   SARS Coronavirus 2 by RT PCR NEGATIVE NEGATIVE Final    Comment: (NOTE) SARS-CoV-2 target nucleic acids are NOT DETECTED. The SARS-CoV-2 RNA is generally detectable in upper respiratoy specimens during the acute phase of infection. The lowest concentration of SARS-CoV-2 viral copies this assay can detect is 131 copies/mL. A negative result does not preclude SARS-Cov-2 infection and should not be used as the sole basis for treatment or other patient management decisions. A negative result may occur with  improper specimen collection/handling, submission of specimen other than nasopharyngeal swab, presence of viral mutation(s) within the areas targeted by this assay, and inadequate number of viral copies (<131 copies/mL). A negative result must be combined with clinical observations, patient history, and epidemiological information. The expected result is Negative. Fact Sheet for Patients:  PinkCheek.be Fact Sheet for Healthcare Providers:  GravelBags.it This test is not yet ap proved or cleared by the [place] FDA and  has been authorized for detection and/or diagnosis of SARS-CoV-2 by FDA under an Emergency Use Authorization (EUA). This EUA will remain  in effect (meaning this test can be used) for the duration of the COVID-19 declaration under  Section 564(b)(1) of the Act, 21 U.S.C. section 360bbb-3(b)(1), unless the authorization is terminated or revoked sooner.    Influenza A by PCR NEGATIVE NEGATIVE Final   Influenza B by PCR NEGATIVE NEGATIVE Final    Comment: (NOTE) The Xpert Xpress SARS-CoV-2/FLU/RSV assay is intended as an aid in  the diagnosis of influenza from Nasopharyngeal swab specimens and  should not be used as a sole basis for treatment. Nasal washings and  aspirates are unacceptable for Xpert Xpress SARS-CoV-2/FLU/RSV  testing. Fact Sheet for Patients: PinkCheek.be Fact Sheet for Healthcare Providers: GravelBags.it This test is not yet approved or cleared by the [place] FDA and  has been authorized for detection and/or diagnosis of SARS-CoV-2 by  FDA under an Emergency Use Authorization (EUA). This EUA will remain  in effect (meaning this test can be used) for the duration of the  Covid-19 declaration under Section 564(b)(1) of the Act, 21  U.S.C. section 360bbb-3(b)(1), unless the authorization is  terminated or revoked. Performed at [facility], [address] [place], [place] [address]   MRSA PCR Screening     Status: None   Collection Time:  11/07/19 12:47 AM   Specimen: Nasopharyngeal  Result Value Ref Range Status   MRSA by PCR NEGATIVE NEGATIVE Final    Comment:        The GeneXpert MRSA Assay (FDA approved for NASAL specimens only), is one component of a comprehensive MRSA colonization surveillance program. It is not intended to diagnose MRSA infection nor to guide or monitor treatment for MRSA infections. Performed at [facility], [address], [place], [place] [address]          Radiology Studies: DG Knee 2 Views Left  Result Date: 11/06/2019 CLINICAL DATA:  Chronic left knee pain. EXAM: LEFT KNEE - 1-2 VIEW COMPARISON:  None. FINDINGS: No evidence of fracture, dislocation, or joint effusion.  Severe narrowing of medial joint space is noted with osteophyte formation. Moderate narrowing of patellofemoral space is noted. Soft tissues are unremarkable. IMPRESSION: Severe degenerative joint disease. No acute abnormality seen in the left knee. Electronically Signed   By: Marijo Conception M.D.   On: 11/06/2019 16:16   DG Knee 2 Views Right  Result Date: 11/06/2019 CLINICAL DATA:  Multiple falls, chronic knee pain EXAM: RIGHT KNEE - 1-2 VIEW COMPARISON:  None. FINDINGS: Alignment is anatomic. There is no acute fracture. No joint effusion there are tricompartmental changes of osteoarthritis. IMPRESSION: Tricompartmental osteoarthritis. Electronically Signed   By: Macy Mis M.D.   On: 11/06/2019 16:15        Scheduled Meds: . aspirin EC  81 mg Oral Daily  . atorvastatin  40 mg Oral QHS  . busPIRone  10 mg Oral BID  . [START ON 11/09/2019] enoxaparin (LOVENOX) injection  40 mg Subcutaneous Q24H  . insulin aspart  0-20 Units Subcutaneous TID WC  . insulin aspart  0-5 Units Subcutaneous QHS  . lamoTRIgine  200 mg Oral Daily  . levothyroxine  200 mcg Oral Q0600  . pantoprazole  40 mg Oral Daily  . QUEtiapine  25 mg Oral BID  . sertraline  25 mg Oral Daily   Continuous Infusions:   Assessment & Plan:   Principal Problem:   Hypotension Active Problems:   Acute kidney injury (HCC)   Sinus bradycardia   MDD (major depressive disorder), recurrent episode, severe (HCC)   Dementia with behavioral disturbance (HCC)   Type 2 diabetes mellitus with hyperlipidemia (HCC)   History of breast cancer   Essential hypertension   Post-surgical hypothyroidism   CKD (chronic kidney disease) stage 3, GFR 30-59 ml/min   AKI (acute kidney injury) ([place])   Hypotension with history of essential hypertension -Suspect related to antihypertensives, poor oral intake -No clinical stigmata of infection but will continue to monitor -hold all home antihypertensives which include losartan,  Demadex -Improving, continue IV hydration    AKI (acute kidney injury) ([place])   CKD (chronic kidney disease) stage 3, GFR 30-59 ml/min -Suspect medication related, possible poor oral intake -Patient on multiple agents that can affect kidney function -Hold Metformin, torsemide, linagliptin, losartan, Lyrica and oxybutynin -improving , continue IV hydration and continue to monitor renal function    Sinus bradycardia -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 This is while in bed. Cards input appreciated, no PPM indication Avoid avn blockers or other potential meds to exacerbate it. -Patient was evaluated for presyncope related to symptomatic bradycardia back in December.  Had an unremarkable echocardiogram.  Pacemaker was not felt necessary at the time -Continue telemetry monitoring Plan: Per cards:The patient should follow up with Dr. [name] as an outpatient  1 week after discharge for possible stress test and further evaluation regarding the indications for possible pacemaker placement in the near-future.      MDD (major depressive disorder), recurrent episode, severe ([place])   Dementia with behavioral disturbance ([place]) -Patient was being boarded in the emergency room pending inpatient psych admission -Behavioral health consult for continued management -Continue BuSpar, Lamictal and Seroquel as started by psych service in the emergency room -Following medical clearance patient will return to psych service- placement pending.    Type 2 diabetes mellitus with hyperlipidemia (HCC) -His recent A1c was 6.2 -Holding home Metformin and linagliptin due to renal function -Sliding scale insulin coverage    History of breast cancer -No acute concerns at this time    Post-surgical hypothyroidism -TSH on arrival was elevated at 7.36 with T4 of 1.5 -Continue levothyroxine 200 mg, increased from 175 right admission  Hypertension Start amlodipine.  Avoid nephrotoxic meds 2/2  above.   DVT prophylaxis: lovenox Code Status:full Family Communication: none at bedside Disposition Plan: behavioral health service Barrier: Still receiving IV fluids.  She also needs psych placement pending-behavioral health working on this.     LOS: 0 days   Time spent: 45 min with >50% on coc    Nolberto Hanlon, MD [facility] Pager [phone]  If 7PM-7AM, please contact night-coverage [email or web address] Password [facility] 11/08/2019, 3:43 PM Patient ID: Melanie Young, female   DOB: 01-05-1941, 79 y.o.   MRN: [id number]

## 2019-11-09 LAB — BASIC METABOLIC PANEL
Anion gap: 8 (ref 5–15)
BUN: 16 mg/dL (ref 8–23)
CO2: 23 mmol/L (ref 22–32)
Calcium: 8.7 mg/dL — ABNORMAL LOW (ref 8.9–10.3)
Chloride: 108 mmol/L (ref 98–111)
Creatinine, Ser: 0.97 mg/dL (ref 0.44–1.00)
GFR calc Af Amer: >60 mL/min (ref >60)
GFR calc non Af Amer: 57 mL/min — ABNORMAL LOW (ref >60)
Glucose, Bld: 106 mg/dL — ABNORMAL HIGH (ref 70–99)
Potassium: 4 mmol/L (ref 3.5–5.1)
Sodium: 139 mmol/L (ref 135–145)

## 2019-11-09 LAB — BLOOD GAS, VENOUS
Acid-Base Excess: 2.9 mmol/L — ABNORMAL HIGH (ref 0.0–2.0)
Bicarbonate: 29.8 mmol/L — ABNORMAL HIGH (ref 20.0–28.0)
O2 Saturation: 55.7 %
Patient temperature: 37
pCO2, Ven: 54 mmHg (ref 44.0–60.0)
pH, Ven: 7.35 (ref 7.250–7.430)

## 2019-11-09 LAB — GLUCOSE, CAPILLARY
Glucose-Capillary: 105 mg/dL — ABNORMAL HIGH (ref 70–99)
Glucose-Capillary: 121 mg/dL — ABNORMAL HIGH (ref 70–99)
Glucose-Capillary: 91 mg/dL (ref 70–99)
Glucose-Capillary: 99 mg/dL (ref 70–99)

## 2019-11-09 NOTE — Progress Notes (Addendum)
[facility] Cardiology  Patient Description: Melanie Young is a 79 year old African-American female with PMH significant for hypertension, bradycardia, DM type II, hypercholesterolemia, hypothyroidism, CKD, depression, vascular dementia and osteoporosis who presented to [facility] ED on 11/03/2019 with complaints of aggressive behavior and mild chest pain in which was medically cleared with 2 negative troponins.  While being boarded in the emergency room awaiting inpatient psychiatric admission, the patient was found to be hypotensive and bradycardic, thus she was admitted to the telemetry floor for medical care.   SUBJECTIVE: The patient reports to be feeling well on today but c/o bilateral knee pain. The patient states that she slept well on last night and has been able to sit on the side of the bed for breakfast. The patient denies all chest pain and dyspnea at this time.    OBJECTIVE: The patient appears to be doing reasonably well on today and is AOx3. Her BLE edema has significantly decreased and she is able to sit on the side of the been independently. She continues to be in SB with a HR of 53bpm and remains asymptomatic.   Vitals:   11/08/19 0730 11/08/19 1947 11/09/19 0455 11/09/19 0721  BP: (!) 148/66 (!) 165/76 (!) 154/69 (!) 129/54  Pulse: (!) 47 (!) 50 (!) 52 (!) 48  Resp: 16 16 16 19   Temp: 98.3 F (36.8 C) 98.3 F (36.8 C) 97.9 F (36.6 C) 98.2 F (36.8 C)  TempSrc: Oral Oral Oral Oral  SpO2: 98% 97% 99% 100%  Weight:   108.2 kg   Height:         Intake/Output Summary (Last 24 hours) at 11/09/2019 1042 Last data filed at 11/09/2019 [id number] Gross per 24 hour  Intake --  Output 450 ml  Net -450 ml      PHYSICAL EXAM  General: Well developed, well nourished, in no acute distress HEENT:  Normocephalic and atramatic Neck:  No JVD, symmetrical  Lungs: Clear bilaterally to auscultation Heart: HRRR . Normal S1 and S2 without gallops or murmurs.  Abdomen: Bowel sounds are positive, abdomen  soft and non-tender  Msk: slightly diminished strength  Extremities: No clubbing, edema or cyanosis.   Neuro: Alert and oriented X 2-3 Psych:  Good affect, responds appropriately at times    LABS: Basic Metabolic Panel: Recent Labs    11/08/19 0850 11/09/19 0712  NA 140 139  K 4.2 4.0  CL 109 108  CO2 24 23  GLUCOSE 127* 106*  BUN 19 16  CREATININE 1.11* 0.97  CALCIUM 8.8* 8.7*   Liver Function Tests: No results for input(s): AST, ALT, ALKPHOS, BILITOT, PROT, ALBUMIN in the last 72 hours. No results for input(s): LIPASE, AMYLASE in the last 72 hours. CBC: Recent Labs    11/06/19 2149 11/07/19 0457  WBC 6.2 5.2  HGB 10.7* 10.8*  HCT 34.7* 33.5*  MCV 89.2 85.5  PLT 187 179   Cardiac Enzymes: No results for input(s): CKTOTAL, CKMB, CKMBINDEX, TROPONINI in the last 72 hours. BNP: Invalid input(s): POCBNP D-Dimer: No results for input(s): DDIMER in the last 72 hours. Hemoglobin A1C: No results for input(s): HGBA1C in the last 72 hours. Fasting Lipid Panel: No results for input(s): CHOL, HDL, LDLCALC, TRIG, CHOLHDL, LDLDIRECT in the last 72 hours. Thyroid Function Tests: Recent Labs    11/06/19 1840  TSH 9.781*   Anemia Panel: No results for input(s): VITAMINB12, FOLATE, FERRITIN, TIBC, IRON, RETICCTPCT in the last 72 hours.  No results found.   Echo: Echocardiogram 2D complete: (08/2019)  IMPRESSIONS  1. Left ventricular ejection fraction, by visual estimation, is 55 to  60%. The left ventricle has normal function. There is no left ventricular  hypertrophy.  2. Definity contrast agent was given IV to delineate the left ventricular  endocardial borders.  3. The left ventricle has no regional wall motion abnormalities.  4. Global right ventricle has normal systolic function.The right  ventricular size is normal. No increase in right ventricular wall  thickness.  5. Left atrial size was normal.  6. Right atrial size was normal.  7. The mitral valve  is normal in structure. Trivial mitral valve  regurgitation.  8. The tricuspid valve is normal in structure.  9. The tricuspid valve is normal in structure. Tricuspid valve  regurgitation is trivial.  10. The aortic valve is normal in structure. Aortic valve regurgitation is  not visualized.  11. The pulmonic valve was normal in structure. Pulmonic valve  regurgitation is not visualized.   TELEMETRY: SB with HR of 53 bpm   ASSESSMENT AND PLAN:  Principal Problem:   Hypotension Active Problems:   Acute kidney injury (HCC)   Sinus bradycardia   MDD (major depressive disorder), recurrent episode, severe (HCC)   Dementia with behavioral disturbance (HCC)   Type 2 diabetes mellitus with hyperlipidemia (HCC)   History of breast cancer   Essential hypertension   Post-surgical hypothyroidism   CKD (chronic kidney disease) stage 3, GFR 30-59 ml/min   AKI (acute kidney injury) ([place])    PLAN:   Sinus Bradycardia, unclear etiology, reasonably stable, asymptomatic              -Permanent pacemaker not indicated at this time.             -Please continue to avoid beta blockers or the use of any medications that have the potential to exacerbate bradycardia.             -Recommendation: The patient should follow up with Dr. [name] as an outpatient 1 week after discharge for possible stress test and further evaluation regarding the indications for possible pacemaker placement in the near-future.              -ECG has been ordered, but has yet to be performed   Hypotension with h/o essential HTN, likely due to anti-hypertensive medications in the setting of decreased oral intake and AKI, reasonably stable, recurrent issue             -Agree with Amlodipine 5mg    -Monitor BP q4h   AKI in the setting of CKD stage 3, kidney functioning improving with recent creatinine=0.97/Bun=16             -We are in agreement with current plan     DM Type II, reasonably stable, Hg A1c=6.2, Metformin and  Linagliptin is being held due to AKI             -We are in agreement with current plan              MDD, reasonably stable, managed by Psychiatry             -Please continue plan as detailed by Psychiatry and avoid any medications that can further exacerbate bradycardia    -Patient is cleared for inpatient behavioral health admission from a Cardiac standpoint.   Cloma Rahrig, ACNPC-AG  11/09/2019 10:42 AM

## 2019-11-09 NOTE — Progress Notes (Signed)
Physical Therapy Treatment Patient Details Name: Melanie Young MRN: [id number] DOB: 1940/08/24 Today's Date: 11/09/2019    History of Present Illness Patient is a 79 y/o F that returned to ED from facility due to AMS and bradycardia. She has been quite emotional, noted to have been tearful throughout much of the week she has been in ED. Per RN, she has been having difficulty with transfers and ambulation.    PT Comments    Pt was asleep in supine upon arriving with sitter in room. She easily awakes and agrees to PT session. Pt states" I can not stand," However was able to stand 2 x EOB. Pt has poor insight of limitations. HR between 58-72 bpm throughout. She was able to exit R side of bed with increased time and min assist. Sat EOB with feet supported without LOB x ~ 8 minutes. She stood from elevated bed height to RW with min assist + vcs. Linen changed quickly while in standing. She was able to stand ~ 20 sec prior to requiring seated rest. After 2nd trial, pt reports fatigue and requested to return to supine with max assist 2/2 to poor pt participation. Once repositioned pt performed there ex (listed below) and tolerated well.  Overall pt is progressing and demonstrates more ability then she leads on. Sitter in room post session with call bell in reach and bed alarm in place. Acute PT will continue to follow per POC and progress pt as able.    Follow Up Recommendations  SNF     Equipment Recommendations  Rolling walker with 5" wheels    Recommendations for Other Services       Precautions / Restrictions Precautions Precautions: Fall Restrictions Weight Bearing Restrictions: No    Mobility  Bed Mobility Overal bed mobility: Needs Assistance Bed Mobility: Supine to Sit;Sit to Supine     Supine to sit: Min assist Sit to supine: Max assist(pt stopped participating)   General bed mobility comments: Pt was able to progress from long sitting to short sit EOB with min assist.   INcraesed time to perform.  Transfers Overall transfer level: Needs assistance Equipment used: Rolling walker (2 wheeled) Transfers: Sit to/from Stand Sit to Stand: Min assist;From elevated surface         General transfer comment: Pt was able to stand 2 x from EOB even with constant reports of not being able too. She could stand for ~ 20 sec prior to requesting to sit. Pt very "dramatic" about limitations however did tolerated well.  Ambulation/Gait             General Gait Details: unsafe to progress at this time.    Stairs             Wheelchair Mobility    Modified Rankin (Stroke Patients Only)       Balance                                            Cognition Arousal/Alertness: Awake/alert Behavior During Therapy: WFL for tasks assessed/performed Overall Cognitive Status: Impaired/Different from baseline Area of Impairment: Safety/judgement;Problem solving;Awareness                         Safety/Judgement: Decreased awareness of safety;Decreased awareness of deficits Awareness: Intellectual Problem Solving: Requires tactile cues;Requires verbal cues;Difficulty sequencing;Decreased initiation;Slow processing General Comments: Pt was  alert throughout however has poor insight of her abilities. She states multiple times " I can't stand however was able to stand. Pt has sitter in room throughout for safety      Exercises General Exercises - Lower Extremity Ankle Circles/Pumps: AROM;Both;10 reps;Supine Quad Sets: AROM;Both;10 reps;Supine Gluteal Sets: AROM;10 reps;Supine Long Arc Quad: AROM;Both;10 reps;Seated Heel Slides: AROM;Both;10 reps;Supine Hip ABduction/ADduction: AROM;Both;5 reps;Supine Straight Leg Raises: AAROM;Both;10 reps;Supine    General Comments        Pertinent Vitals/Pain Pain Assessment: 0-10 Pain Score: 7  Faces Pain Scale: Hurts even more Pain Location: BLE knee pain Pain Descriptors /  Indicators: Aching;Grimacing;Crying;Moaning Pain Intervention(s): Limited activity within patient's tolerance;Monitored during session;Repositioned    Home Living                      Prior Function            PT Goals (current goals can now be found in the care plan section) Acute Rehab PT Goals Patient Stated Goal: " To be able to move without pain" Progress towards PT goals: Progressing toward goals    Frequency    Min 2X/week      PT Plan Current plan remains appropriate    Co-evaluation              AM-PAC PT "6 Clicks" Mobility   Outcome Measure  Help needed turning from your back to your side while in a flat bed without using bedrails?: A Lot Help needed moving from lying on your back to sitting on the side of a flat bed without using bedrails?: A Lot Help needed moving to and from a bed to a chair (including a wheelchair)?: A Lot Help needed standing up from a chair using your arms (e.g., wheelchair or bedside chair)?: A Lot Help needed to walk in hospital room?: A Lot Help needed climbing 3-5 steps with a railing? : Total 6 Click Score: 11    End of Session Equipment Utilized During Treatment: Gait belt Activity Tolerance: Patient limited by pain;Patient limited by fatigue Patient left: in bed;with bed alarm set;with nursing/sitter in room;with call bell/phone within reach Nurse Communication: Mobility status PT Visit Diagnosis: Difficulty in walking, not elsewhere classified (R26.2);History of falling (Z91.81)     Time: [id number] PT Time Calculation (min) (ACUTE ONLY): 15 min  Charges:  $Therapeutic Activity: 8-22 mins                     {Lamichael Youkhana PTA 11/09/19, 3:42 PM

## 2019-11-09 NOTE — Progress Notes (Addendum)
PROGRESS NOTE    Melanie Young  [id number] DOB: 17-Dec-1940 DOA: 11/03/2019 PCP: System, Pcp Not In     Assessment & Plan:   Principal Problem:   Hypotension Active Problems:   Acute kidney injury ([place])   Sinus bradycardia   MDD (major depressive disorder), recurrent episode, severe ([place])   Dementia with behavioral disturbance ([place])   Type 2 diabetes mellitus with hyperlipidemia ([place])   History of breast cancer   Essential hypertension   Post-surgical hypothyroidism   CKD (chronic kidney disease) stage 3, GFR 30-59 ml/min   AKI (acute kidney injury) ([place])    Hypotension: with history of essential hypertension. Resolved. Hold home antihypertensives which include losartan, Demadex  AKI on CKDIIIa: Cr is back to baseline. Continue to hold metformin, torsemide, linagliptin, losartan, Lyrica and oxybutynin. 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 Avoid avn blockers or other potential meds to exacerbate it. Pt evaluated for presyncope related to symptomatic bradycardia back in December. Had an unremarkable echocardiogram. Pacemaker was not felt necessary at the time. Continue tele. Should follow up with Dr. [name] as an outpatient 1 week after discharge for possible stress test and further evaluation regarding the indications for possible pacemaker placement in the near-future.   Major depressive disorder: recurrent episode, severe. Continue BuSpar, Lamictal and Seroquel as started by psych service in the emergency room. Medically stable for d/c to inpatient psych   Dementia with behavioral disturbance: behavioral health consult for continued management. Medically stable for d/c to inpatient psych   DM2: pretty well controlled w/ HbA1c 6.2. Continue to hold home metformin and linagliptin due to renal function. Continue on SSI w/ accuchecks   History of breast cancer: no acute concerns at this time  Post-surgical  hypothyroidism: continue levothyroxine  Hypertension: continue on amlodipine.     DVT prophylaxis: lovenox Code Status: full  Family Communication:  Disposition Plan: medically stable for transfer to inpatient psych; contacted psych regarding this and awaiting recs    Consultants:   Psych  cardio   Procedures:    Antimicrobials:    Subjective: Pt c/o b/l leg pain  Objective: Vitals:   11/08/19 0730 11/08/19 1947 11/09/19 0455 11/09/19 0721  BP: (!) 148/66 (!) 165/76 (!) 154/69 (!) 129/54  Pulse: (!) 47 (!) 50 (!) 52 (!) 48  Resp: 16 16 16 19   Temp: 98.3 F (36.8 C) 98.3 F (36.8 C) 97.9 F (36.6 C) 98.2 F (36.8 C)  TempSrc: Oral Oral Oral Oral  SpO2: 98% 97% 99% 100%  Weight:   108.2 kg   Height:        Intake/Output Summary (Last 24 hours) at 11/09/2019 0853 Last data filed at 11/09/2019 0610 Gross per 24 hour  Intake --  Output 450 ml  Net -450 ml   Filed Weights   11/03/19 0755 11/04/19 0741 11/09/19 0455  Weight: 105.8 kg 105.8 kg 108.2 kg    Examination:  General exam: Appears calm and comfortable  Respiratory system: Clear to auscultation. No rales Cardiovascular system: S1 & S2 +. No rubs, gallops or clicks.  Gastrointestinal system: Abdomen is obese, soft and nontender. . Normal bowel sounds heard. Central nervous system: Alert and oriented to person, place & time. Psychiatry: Judgement and insight appear normal. Flat mood and affect.     Data Reviewed: I have personally reviewed following labs and imaging studies  CBC: Recent Labs  Lab 11/03/19 0802 11/06/19 2149 11/07/19 0457  WBC 5.6 6.2 5.2  NEUTROABS  2.3  --   --   HGB 11.9* 10.7* 10.8*  HCT 37.7 34.7* 33.5*  MCV 87.3 89.2 85.5  PLT 101* 187 0000000   Basic Metabolic Panel: Recent Labs  Lab 11/03/19 0802 11/06/19 1840 11/07/19 0457 11/08/19 0850 11/09/19 0712  NA 137 140 141 140 139  K 4.1 4.6 4.2 4.2 4.0  CL 105 106 109 109 108  CO2 22 25 25 24 23   GLUCOSE 114*  101* 138* 127* 106*  BUN 19 31* 32* 19 16  CREATININE 0.98 2.56* 2.15* 1.11* 0.97  CALCIUM 9.2 9.0 8.8* 8.8* 8.7*   GFR: Estimated Creatinine Clearance: 56.1 mL/min (by C-G formula based on SCr of 0.97 mg/dL). Liver Function Tests: No results for input(s): AST, ALT, ALKPHOS, BILITOT, PROT, ALBUMIN in the last 168 hours. No results for input(s): LIPASE, AMYLASE in the last 168 hours. No results for input(s): AMMONIA in the last 168 hours. Coagulation Profile: No results for input(s): INR, PROTIME in the last 168 hours. Cardiac Enzymes: No results for input(s): CKTOTAL, CKMB, CKMBINDEX, TROPONINI in the last 168 hours. BNP (last 3 results) No results for input(s): PROBNP in the last 8760 hours. HbA1C: No results for input(s): HGBA1C in the last 72 hours. CBG: Recent Labs  Lab 11/08/19 0725 11/08/19 1142 11/08/19 1641 11/08/19 2014 11/09/19 0719  GLUCAP 102* 118* 118* 127* 105*   Lipid Profile: No results for input(s): CHOL, HDL, LDLCALC, TRIG, CHOLHDL, LDLDIRECT in the last 72 hours. Thyroid Function Tests: Recent Labs    11/06/19 1840  TSH 9.781*   Anemia Panel: No results for input(s): VITAMINB12, FOLATE, FERRITIN, TIBC, IRON, RETICCTPCT in the last 72 hours. Sepsis Labs: No results for input(s): PROCALCITON, LATICACIDVEN in the last 168 hours.  Recent Results (from the past 240 hour(s))  Respiratory Panel by RT PCR (Flu A&B, Covid) - Nasopharyngeal Swab     Status: None   Collection Time: 11/03/19  3:24 PM   Specimen: Nasopharyngeal Swab  Result Value Ref Range Status   SARS Coronavirus 2 by RT PCR NEGATIVE NEGATIVE Final    Comment: (NOTE) SARS-CoV-2 target nucleic acids are NOT DETECTED. The SARS-CoV-2 RNA is generally detectable in upper respiratoy specimens during the acute phase of infection. The lowest concentration of SARS-CoV-2 viral copies this assay can detect is 131 copies/mL. A negative result does not preclude SARS-Cov-2 infection and should not be  used as the sole basis for treatment or other patient management decisions. A negative result may occur with  improper specimen collection/handling, submission of specimen other than nasopharyngeal swab, presence of viral mutation(s) within the areas targeted by this assay, and inadequate number of viral copies (<131 copies/mL). A negative result must be combined with clinical observations, patient history, and epidemiological information. The expected result is Negative. Fact Sheet for Patients:  PinkCheek.be Fact Sheet for Healthcare Providers:  GravelBags.it This test is not yet ap proved or cleared by the [place] FDA and  has been authorized for detection and/or diagnosis of SARS-CoV-2 by FDA under an Emergency Use Authorization (EUA). This EUA will remain  in effect (meaning this test can be used) for the duration of the COVID-19 declaration under Section 564(b)(1) of the Act, 21 U.S.C. section 360bbb-3(b)(1), unless the authorization is terminated or revoked sooner.    Influenza A by PCR NEGATIVE NEGATIVE Final   Influenza B by PCR NEGATIVE NEGATIVE Final    Comment: (NOTE) The Xpert Xpress SARS-CoV-2/FLU/RSV assay is intended as an aid in  the diagnosis of influenza  from Nasopharyngeal swab specimens and  should not be used as a sole basis for treatment. Nasal washings and  aspirates are unacceptable for Xpert Xpress SARS-CoV-2/FLU/RSV  testing. Fact Sheet for Patients: PinkCheek.be Fact Sheet for Healthcare Providers: GravelBags.it This test is not yet approved or cleared by the [place] FDA and  has been authorized for detection and/or diagnosis of SARS-CoV-2 by  FDA under an Emergency Use Authorization (EUA). This EUA will remain  in effect (meaning this test can be used) for the duration of the  Covid-19 declaration under Section 564(b)(1) of the  Act, 21  U.S.C. section 360bbb-3(b)(1), unless the authorization is  terminated or revoked. Performed at [facility], [address] [place], [place] [address]   MRSA PCR Screening     Status: None   Collection Time: 11/07/19 12:47 AM   Specimen: Nasopharyngeal  Result Value Ref Range Status   MRSA by PCR NEGATIVE NEGATIVE Final    Comment:        The GeneXpert MRSA Assay (FDA approved for NASAL specimens only), is one component of a comprehensive MRSA colonization surveillance program. It is not intended to diagnose MRSA infection nor to guide or monitor treatment for MRSA infections. Performed at [facility], [address], [place], [place] [address]          Radiology Studies: No results found.      Scheduled Meds: . amLODipine  5 mg Oral Daily  . aspirin EC  81 mg Oral Daily  . atorvastatin  40 mg Oral QHS  . busPIRone  10 mg Oral BID  . enoxaparin (LOVENOX) injection  40 mg Subcutaneous Q24H  . insulin aspart  0-20 Units Subcutaneous TID WC  . insulin aspart  0-5 Units Subcutaneous QHS  . lamoTRIgine  200 mg Oral Daily  . levothyroxine  200 mcg Oral Q0600  . pantoprazole  40 mg Oral Daily  . QUEtiapine  25 mg Oral BID  . sertraline  25 mg Oral Daily   Continuous Infusions:   LOS: 1 day    Time spent: 30 mins    Wyvonnia Dusky, MD [facility] Pager [phone]  If 7PM-7AM, please contact night-coverage [email or web address] 11/09/2019, 8:53 AM

## 2019-11-09 NOTE — Progress Notes (Signed)
Alerted Dr. Jimmye Norman that patient's BP is 129/54 and HR 48. MD confirmed to give amlodipine. MD also stated she would renew cardiac monitoring order.

## 2019-11-10 LAB — GLUCOSE, CAPILLARY
Glucose-Capillary: 94 mg/dL (ref 70–99)
Glucose-Capillary: 100 mg/dL — ABNORMAL HIGH (ref 70–99)
Glucose-Capillary: 115 mg/dL — ABNORMAL HIGH (ref 70–99)
Glucose-Capillary: 118 mg/dL — ABNORMAL HIGH (ref 70–99)

## 2019-11-10 LAB — BASIC METABOLIC PANEL
Anion gap: 7 (ref 5–15)
BUN: 13 mg/dL (ref 8–23)
CO2: 25 mmol/L (ref 22–32)
Calcium: 9.1 mg/dL (ref 8.9–10.3)
Chloride: 107 mmol/L (ref 98–111)
Creatinine, Ser: 0.81 mg/dL (ref 0.44–1.00)
GFR calc Af Amer: >60 mL/min (ref >60)
GFR calc non Af Amer: >60 mL/min (ref >60)
Glucose, Bld: 97 mg/dL (ref 70–99)
Potassium: 4 mmol/L (ref 3.5–5.1)
Sodium: 139 mmol/L (ref 135–145)

## 2019-11-10 LAB — CBC
HCT: 35.1 % — ABNORMAL LOW (ref 36.0–46.0)
Hemoglobin: 11.6 g/dL — ABNORMAL LOW (ref 12.0–15.0)
MCH: 28.1 pg (ref 26.0–34.0)
MCHC: 33 g/dL (ref 30.0–36.0)
MCV: 85 fL (ref 80.0–100.0)
Platelets: 190 10*3/uL (ref 150–400)
RBC: 4.13 MIL/uL (ref 3.87–5.11)
RDW: 15.9 % — ABNORMAL HIGH (ref 11.5–15.5)
WBC: 5.2 10*3/uL (ref 4.0–10.5)
nRBC: 0 % (ref 0.0–0.2)

## 2019-11-10 MED ORDER — HYDRALAZINE HCL 50 MG PO TABS
25.0000 mg | ORAL_TABLET | Freq: Four times a day (QID) | ORAL | Status: DC | PRN
  Administered 2019-11-10 – 2019-11-15 (×3): 25 mg via ORAL
  Filled 2019-11-10 (×4): qty 1

## 2019-11-10 NOTE — Progress Notes (Signed)
[facility] Cardiology  Patient Description: Melanie Young is a 79 year old African-American female with PMH significant for hypertension, bradycardia, DM type II, hypercholesterolemia, hypothyroidism, CKD, depression, vascular dementia and osteoporosis who presented to [facility] ED on 11/03/2019 with complaints of aggressive behavior and mild chest pain in which was medically cleared with 2 negative troponins.  While being boarded in the emergency room awaiting inpatient psychiatric admission, the patient was found to be hypotensive and bradycardic, thus she was admitted to the telemetry floor for medical care.   SUBJECTIVE: The patient reports to be feeling fairly well on today but reports that she did not sleep well on last night due to bilateral knee pain. The patient endorses intermittent chest pain under her left breast, but states that it is not present at this time. The patient denies all dizziness, syncope, dyspnea and weakness at this time. She expresses concerns over her discharge to inpatient behavioral health.     OBJECTIVE: The patient appears to be doing reasonably well on today and is AOx3. The BLE edema has resolved. She appears to be more active than previous. She became tearful when speaking about her discharge from the medical unit, but it quickly subsided.   Vitals:   11/09/19 1635 11/09/19 2004 11/10/19 0431 11/10/19 0757  BP: (!) 158/71 (!) 167/77 (!) 156/75 (!) 175/74  Pulse: (!) 51 (!) 50 (!) 57 (!) 54  Resp: 19 20 20    Temp: (!) 97.5 F (36.4 C) 98.8 F (37.1 C) 98.2 F (36.8 C) 98 F (36.7 C)  TempSrc: Oral Oral  Oral  SpO2: 98% 99% 95% 98%  Weight:      Height:         Intake/Output Summary (Last 24 hours) at 11/10/2019 [id number] Last data filed at 11/10/2019 0241 Gross per 24 hour  Intake --  Output 900 ml  Net -900 ml      PHYSICAL EXAM  General: Well developed, well nourished, in no acute distress HEENT:  Normocephalic and atraumatic Neck:  No JVD, symmetrical  Lungs:  Clear bilaterally to auscultation Heart: HRRR . Normal S1 and S2 without gallops or murmurs.  Abdomen: Bowel sounds are positive, abdomen soft and non-tender  Msk: slightly diminished strength  Extremities: No clubbing, edema or cyanosis.   Neuro: Alert and oriented X 2-3 Psych:  Good affect, responds appropriately at times    LABS: Basic Metabolic Panel: Recent Labs    11/09/19 0712 11/10/19 0526  NA 139 139  K 4.0 4.0  CL 108 107  CO2 23 25  GLUCOSE 106* 97  BUN 16 13  CREATININE 0.97 0.81  CALCIUM 8.7* 9.1   Liver Function Tests: No results for input(s): AST, ALT, ALKPHOS, BILITOT, PROT, ALBUMIN in the last 72 hours. No results for input(s): LIPASE, AMYLASE in the last 72 hours. CBC: Recent Labs    11/10/19 0526  WBC 5.2  HGB 11.6*  HCT 35.1*  MCV 85.0  PLT 190   Cardiac Enzymes: No results for input(s): CKTOTAL, CKMB, CKMBINDEX, TROPONINI in the last 72 hours. BNP: Invalid input(s): POCBNP D-Dimer: No results for input(s): DDIMER in the last 72 hours. Hemoglobin A1C: No results for input(s): HGBA1C in the last 72 hours. Fasting Lipid Panel: No results for input(s): CHOL, HDL, LDLCALC, TRIG, CHOLHDL, LDLDIRECT in the last 72 hours. Thyroid Function Tests: No results for input(s): TSH, T4TOTAL, T3FREE, THYROIDAB in the last 72 hours.  Invalid input(s): FREET3 Anemia Panel: No results for input(s): VITAMINB12, FOLATE, FERRITIN, TIBC, IRON, RETICCTPCT in the  last 72 hours.  No results found.   Echo: Echocardiogram 2D complete: (08/2019) IMPRESSIONS  1. Left ventricular ejection fraction, by visual estimation, is 55 to  60%. The left ventricle has normal function. There is no left ventricular  hypertrophy.  2. Definity contrast agent was given IV to delineate the left ventricular  endocardial borders.  3. The left ventricle has no regional wall motion abnormalities.  4. Global right ventricle has normal systolic function.The right  ventricular size  is normal. No increase in right ventricular wall  thickness.  5. Left atrial size was normal.  6. Right atrial size was normal.  7. The mitral valve is normal in structure. Trivial mitral valve  regurgitation.  8. The tricuspid valve is normal in structure.  9. The tricuspid valve is normal in structure. Tricuspid valve  regurgitation is trivial.  10. The aortic valve is normal in structure. Aortic valve regurgitation is  not visualized.  11. The pulmonic valve was normal in structure. Pulmonic valve  regurgitation is not visualized.   TELEMETRY: SB with HR of 59 bpm   ASSESSMENT AND PLAN:  Principal Problem:   Hypotension Active Problems:   Acute kidney injury (HCC)   Sinus bradycardia   MDD (major depressive disorder), recurrent episode, severe (HCC)   Dementia with behavioral disturbance (HCC)   Type 2 diabetes mellitus with hyperlipidemia (HCC)   History of breast cancer   Essential hypertension   Post-surgical hypothyroidism   CKD (chronic kidney disease) stage 3, GFR 30-59 ml/min   AKI (acute kidney injury) ([place])    PLAN:   Sinus Bradycardia, unclear etiology, reasonably stable, asymptomatic              -Permanent pacemaker not indicated at this time.             -Please continue to avoid beta blockers or the use of any medications that have the potential to exacerbate bradycardia.             -Recommendation: The patient should follow up with Dr. [name] as an outpatient 1 week after discharge for possible stress test and further evaluation regarding the indications for possible pacemaker placement in the near-future.              -ECG has been ordered, but has yet to be performed   Hypotension with h/o essential HTN, likely due to anti-hypertensive medications in the setting of decreased oral intake and AKI, reasonably stable, recurrent issue             -Agree with Amlodipine 5mg , may increase to 10mg  if HTN is poorly controlled   -Monitor BP q4h   AKI in  the setting of CKD stage 3, kidney functioning is back to baseline             -We are in agreement with current plan  DM Type II, reasonably stable, Hg A1c=6.2, Metformin and Linagliptin is being held due to AKI             -We are in agreement with current plan              MDD, reasonably stable, managed by Psychiatry             -Please continue plan as detailed by Psychiatry and avoid any medications that can further exacerbate bradycardia    PATIENT IS [place].   Leron Stoffers, ACNPC-AG  11/10/2019 9:25 AM

## 2019-11-10 NOTE — Progress Notes (Signed)
PROGRESS NOTE    Melanie Young  [id number] DOB: 1940-11-29 DOA: 11/03/2019 PCP: System, Pcp Not In     Assessment & Plan:   Principal Problem:   Hypotension Active Problems:   Acute kidney injury ([place])   Sinus bradycardia   MDD (major depressive disorder), recurrent episode, severe ([place])   Dementia with behavioral disturbance ([place])   Type 2 diabetes mellitus with hyperlipidemia ([place])   History of breast cancer   Essential hypertension   Post-surgical hypothyroidism   CKD (chronic kidney disease) stage 3, GFR 30-59 ml/min   AKI (acute kidney injury) ([place])  Major depressive disorder: recurrent episode, severe. Continue BuSpar, Lamictal and Seroquel as started by psych service in the emergency room. Medically stable for d/c to inpatient psych   Dementia with behavioral disturbance: behavioral health consult for continued management. Medically stable for d/c to inpatient psych   AKI on CKDIIIa: Cr is back to baseline. Continue to hold metformin, torsemide, linagliptin, losartan, Lyrica and oxybutynin.   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 Avoid avn blockers or other potential meds to exacerbate it. Pt evaluated for presyncope related to symptomatic bradycardia back in December. Had an unremarkable echocardiogram. Pacemaker was not felt necessary at the time. Continue tele. Should follow up with Dr. [name] as an outpatient 1 week after discharge for possible stress test and further evaluation regarding the indications for possible pacemaker placement in the near-future.   DM2: pretty well controlled w/ HbA1c 6.2. Continue to hold home metformin and linagliptin due to renal function. Continue on SSI w/ accuchecks   History of breast cancer: no acute concerns at this time  Post-surgical hypothyroidism: continue levothyroxine  Hypertension: continue on amlodipine. Hydralazine prn   Hypotension: resolved  DVT prophylaxis:  lovenox Code Status: full  Family Communication:  Disposition Plan: medically stable for transfer to inpatient psych; contacted psych but cannot take geriatric pts so CM is working on placement at another inpatient psych facility     Consultants:   Psych  cardio   Procedures:    Antimicrobials:    Subjective: Pt c/o leg pain still but improves w/ tylenol use  Objective: Vitals:   11/09/19 2004 11/10/19 0431 11/10/19 0757 11/10/19 1140  BP: (!) 167/77 (!) 156/75 (!) 175/74 (!) 161/84  Pulse: (!) 50 (!) 57 (!) 54 (!) 55  Resp: 20 20    Temp: 98.8 F (37.1 C) 98.2 F (36.8 C) 98 F (36.7 C) 98.4 F (36.9 C)  TempSrc: Oral  Oral Oral  SpO2: 99% 95% 98% 95%  Weight:      Height:        Intake/Output Summary (Last 24 hours) at 11/10/2019 1342 Last data filed at 11/10/2019 1300 Gross per 24 hour  Intake --  Output 2100 ml  Net -2100 ml   Filed Weights   11/03/19 0755 11/04/19 0741 11/09/19 0455  Weight: 105.8 kg 105.8 kg 108.2 kg    Examination:  General exam: Appears calm and comfortable  Respiratory system: Clear to auscultation. No wheezes, rhonchi Cardiovascular system: S1 & S2 +. No rubs, gallops or clicks.  Gastrointestinal system: Abdomen is obese, soft and nontender. . Normal bowel sounds heard. Central nervous system: Alert and oriented to person, place & time. Psychiatry: Judgement and insight appear normal. Flat mood and affect.     Data Reviewed: I have personally reviewed following labs and imaging studies  CBC: Recent Labs  Lab 11/06/19 2149 11/07/19 0457 11/10/19 0526  WBC 6.2 5.2  5.2  HGB 10.7* 10.8* 11.6*  HCT 34.7* 33.5* 35.1*  MCV 89.2 85.5 85.0  PLT 187 179 [id number]   Basic Metabolic Panel: Recent Labs  Lab 11/06/19 1840 11/07/19 0457 11/08/19 0850 11/09/19 0712 11/10/19 0526  NA 140 141 140 139 139  K 4.6 4.2 4.2 4.0 4.0  CL 106 109 109 108 107  CO2 25 25 24 23 25   GLUCOSE 101* 138* 127* 106* 97  BUN 31* 32* 19 16 13    CREATININE 2.56* 2.15* 1.11* 0.97 0.81  CALCIUM 9.0 8.8* 8.8* 8.7* 9.1   GFR: Estimated Creatinine Clearance: 67.2 mL/min (by C-G formula based on SCr of 0.81 mg/dL). Liver Function Tests: No results for input(s): AST, ALT, ALKPHOS, BILITOT, PROT, ALBUMIN in the last 168 hours. No results for input(s): LIPASE, AMYLASE in the last 168 hours. No results for input(s): AMMONIA in the last 168 hours. Coagulation Profile: No results for input(s): INR, PROTIME in the last 168 hours. Cardiac Enzymes: No results for input(s): CKTOTAL, CKMB, CKMBINDEX, TROPONINI in the last 168 hours. BNP (last 3 results) No results for input(s): PROBNP in the last 8760 hours. HbA1C: No results for input(s): HGBA1C in the last 72 hours. CBG: Recent Labs  Lab 11/09/19 1158 11/09/19 1634 11/09/19 2017 11/10/19 0752 11/10/19 1214  GLUCAP 121* 91 99 94 100*   Lipid Profile: No results for input(s): CHOL, HDL, LDLCALC, TRIG, CHOLHDL, LDLDIRECT in the last 72 hours. Thyroid Function Tests: No results for input(s): TSH, T4TOTAL, FREET4, T3FREE, THYROIDAB in the last 72 hours. Anemia Panel: No results for input(s): VITAMINB12, FOLATE, FERRITIN, TIBC, IRON, RETICCTPCT in the last 72 hours. Sepsis Labs: No results for input(s): PROCALCITON, LATICACIDVEN in the last 168 hours.  Recent Results (from the past 240 hour(s))  Respiratory Panel by RT PCR (Flu A&B, Covid) - Nasopharyngeal Swab     Status: None   Collection Time: 11/03/19  3:24 PM   Specimen: Nasopharyngeal Swab  Result Value Ref Range Status   SARS Coronavirus 2 by RT PCR NEGATIVE NEGATIVE Final    Comment: (NOTE) SARS-CoV-2 target nucleic acids are NOT DETECTED. The SARS-CoV-2 RNA is generally detectable in upper respiratoy specimens during the acute phase of infection. The lowest concentration of SARS-CoV-2 viral copies this assay can detect is 131 copies/mL. A negative result does not preclude SARS-Cov-2 infection and should not be used as  the sole basis for treatment or other patient management decisions. A negative result may occur with  improper specimen collection/handling, submission of specimen other than nasopharyngeal swab, presence of viral mutation(s) within the areas targeted by this assay, and inadequate number of viral copies (<131 copies/mL). A negative result must be combined with clinical observations, patient history, and epidemiological information. The expected result is Negative. Fact Sheet for Patients:  PinkCheek.be Fact Sheet for Healthcare Providers:  GravelBags.it This test is not yet ap proved or cleared by the [place] FDA and  has been authorized for detection and/or diagnosis of SARS-CoV-2 by FDA under an Emergency Use Authorization (EUA). This EUA will remain  in effect (meaning this test can be used) for the duration of the COVID-19 declaration under Section 564(b)(1) of the Act, 21 U.S.C. section 360bbb-3(b)(1), unless the authorization is terminated or revoked sooner.    Influenza A by PCR NEGATIVE NEGATIVE Final   Influenza B by PCR NEGATIVE NEGATIVE Final    Comment: (NOTE) The Xpert Xpress SARS-CoV-2/FLU/RSV assay is intended as an aid in  the diagnosis of influenza from Nasopharyngeal swab specimens  and  should not be used as a sole basis for treatment. Nasal washings and  aspirates are unacceptable for Xpert Xpress SARS-CoV-2/FLU/RSV  testing. Fact Sheet for Patients: PinkCheek.be Fact Sheet for Healthcare Providers: GravelBags.it This test is not yet approved or cleared by the [place] FDA and  has been authorized for detection and/or diagnosis of SARS-CoV-2 by  FDA under an Emergency Use Authorization (EUA). This EUA will remain  in effect (meaning this test can be used) for the duration of the  Covid-19 declaration under Section 564(b)(1) of the Act, 21   U.S.C. section 360bbb-3(b)(1), unless the authorization is  terminated or revoked. Performed at [facility], [address] [place], [place] [address]   MRSA PCR Screening     Status: None   Collection Time: 11/07/19 12:47 AM   Specimen: Nasopharyngeal  Result Value Ref Range Status   MRSA by PCR NEGATIVE NEGATIVE Final    Comment:        The GeneXpert MRSA Assay (FDA approved for NASAL specimens only), is one component of a comprehensive MRSA colonization surveillance program. It is not intended to diagnose MRSA infection nor to guide or monitor treatment for MRSA infections. Performed at [facility], [address], [place], [place] [address]          Radiology Studies: No results found.      Scheduled Meds: . amLODipine  5 mg Oral Daily  . aspirin EC  81 mg Oral Daily  . atorvastatin  40 mg Oral QHS  . busPIRone  10 mg Oral BID  . enoxaparin (LOVENOX) injection  40 mg Subcutaneous Q24H  . insulin aspart  0-20 Units Subcutaneous TID WC  . insulin aspart  0-5 Units Subcutaneous QHS  . lamoTRIgine  200 mg Oral Daily  . levothyroxine  200 mcg Oral Q0600  . pantoprazole  40 mg Oral Daily  . QUEtiapine  25 mg Oral BID  . sertraline  25 mg Oral Daily   Continuous Infusions:   LOS: 2 days    Time spent: 30 mins    Wyvonnia Dusky, MD [facility] Pager [phone]  If 7PM-7AM, please contact night-coverage [email or web address] 11/10/2019, 1:42 PM

## 2019-11-10 NOTE — Plan of Care (Signed)
  Problem: Education: Goal: Knowledge of General Education information will improve Description: Including pain rating scale, medication(s)/side effects and non-pharmacologic comfort measures Outcome: Not Progressing Note: Pt. is confused with superimposed dementia & per report still has suicidal ideation with a sitter in the room. Awaiting inpatient behavioral health transfer. Will continue to monitor psychological status for the remainder of the shift. Wenda Low [facility]

## 2019-11-10 NOTE — Progress Notes (Signed)
Patient still meets inpatient criteria. Patient has been faxed out to the following facilities for review:   [facility]  CSW will continue to follow and assist with finding bed placement.   Domenic Schwab, MSW, LCSW-A Clinical Disposition Social Worker [facility]/TTS [phone]

## 2019-11-11 LAB — CBC
HCT: 34.5 % — ABNORMAL LOW (ref 36.0–46.0)
Hemoglobin: 11.1 g/dL — ABNORMAL LOW (ref 12.0–15.0)
MCH: 27.6 pg (ref 26.0–34.0)
MCHC: 32.2 g/dL (ref 30.0–36.0)
MCV: 85.8 fL (ref 80.0–100.0)
Platelets: 208 10*3/uL (ref 150–400)
RBC: 4.02 MIL/uL (ref 3.87–5.11)
RDW: 15.7 % — ABNORMAL HIGH (ref 11.5–15.5)
WBC: 5.4 10*3/uL (ref 4.0–10.5)
nRBC: 0 % (ref 0.0–0.2)

## 2019-11-11 LAB — BASIC METABOLIC PANEL
Anion gap: 7 (ref 5–15)
BUN: 16 mg/dL (ref 8–23)
CO2: 25 mmol/L (ref 22–32)
Calcium: 8.9 mg/dL (ref 8.9–10.3)
Chloride: 109 mmol/L (ref 98–111)
Creatinine, Ser: 0.89 mg/dL (ref 0.44–1.00)
GFR calc Af Amer: >60 mL/min (ref >60)
GFR calc non Af Amer: >60 mL/min (ref >60)
Glucose, Bld: 104 mg/dL — ABNORMAL HIGH (ref 70–99)
Potassium: 4.4 mmol/L (ref 3.5–5.1)
Sodium: 141 mmol/L (ref 135–145)

## 2019-11-11 LAB — GLUCOSE, CAPILLARY
Glucose-Capillary: 109 mg/dL — ABNORMAL HIGH (ref 70–99)
Glucose-Capillary: 100 mg/dL — ABNORMAL HIGH (ref 70–99)
Glucose-Capillary: 105 mg/dL — ABNORMAL HIGH (ref 70–99)
Glucose-Capillary: 108 mg/dL — ABNORMAL HIGH (ref 70–99)

## 2019-11-11 NOTE — Plan of Care (Signed)
  Problem: Health Behavior/Discharge Planning: Goal: Ability to manage health-related needs will improve Outcome: Progressing   

## 2019-11-11 NOTE — Care Management Important Message (Signed)
Important Message  Patient Details  Name: Tikki Mckowen MRN: [id number] Date of Birth: [birth year]   Medicare Important Message Given:  Other (see comment)  Waiting for inpatient psych placement.   Dannette Barbara 11/11/2019, 12:34 PM

## 2019-11-11 NOTE — Progress Notes (Signed)
[facility]iology  Patient Description: Melanie Young is a 79 year old African-American female with PMH significant for hypertension, bradycardia, DM type II, hypercholesterolemia, hypothyroidism, CKD, depression, vascular dementia and osteoporosis who presented to [facility] ED on 11/03/2019 with complaints of aggressive behavior and mild chest pain in which was medically cleared with 2 negative troponins.  While being boarded in the emergency room awaiting inpatient psychiatric admission, the patient was found to be hypotensive and bradycardic, thus she was admitted to the telemetry floor for medical care.   SUBJECTIVE: The patient reports to be feeling fairly well on today and states that she was able to sleep well on last night.  The patient continues to report intermittent chest pain that is located under her left breast.  The patient denies any associated symptoms or aggravating factors.  The patient states that she was able to get out of bed and sit in a chair for a while yesterday.  The patient denies any complaints of dizziness, weakness, palpitations or peripheral edema.  OBJECTIVE: The patient appears to be doing reasonably well on today but appears to be more confused than previous.  She is AO X2 but is having some rambling of speech.  The patient does not have any unilateral weakness, slurred speech, facial drooping or any other strokelike symptoms.  The patient has normal strength in all extremities.  The patient is in sinus bradycardia and continues to be asymptomatic.  Vitals:   11/10/19 1945 11/11/19 0524 11/11/19 0725 11/11/19 1054  BP: (!) 162/71 (!) 153/72 (!) 145/78 (!) 163/71  Pulse: (!) 54 61 61 61  Resp: 18 18 18 18   Temp: 98.5 F (36.9 C) 98.2 F (36.8 C) 98.5 F (36.9 C) 98.4 F (36.9 C)  TempSrc: Oral Oral Oral Oral  SpO2: 98% 94% 98% 96%  Weight:  107.4 kg    Height:         Intake/Output Summary (Last 24 hours) at 11/11/2019 1135 Last data filed at 11/11/2019 1034 Gross per 24  hour  Intake 480 ml  Output 1600 ml  Net -1120 ml      PHYSICAL EXAM  General: Well developed, well nourished, in no acute distress HEENT:  Normocephalic and atraumatic Neck:  No JVD, symmetrical  Lungs: Clear bilaterally to auscultation Heart: HRRR . Normal S1 and S2 without gallops or murmurs.  Abdomen: Bowel sounds are positive, abdomen soft and non-tender  Msk: Normal strength  Extremities: No clubbing, edema or cyanosis.   Neuro: Alert and oriented X 2 Psych:  Good affect, responds appropriately at times    LABS: Basic Metabolic Panel: Recent Labs    11/10/19 0526 11/11/19 0641  NA 139 141  K 4.0 4.4  CL 107 109  CO2 25 25  GLUCOSE 97 104*  BUN 13 16  CREATININE 0.81 0.89  CALCIUM 9.1 8.9   Liver Function Tests: No results for input(s): AST, ALT, ALKPHOS, BILITOT, PROT, ALBUMIN in the last 72 hours. No results for input(s): LIPASE, AMYLASE in the last 72 hours. CBC: Recent Labs    11/10/19 0526 11/11/19 0641  WBC 5.2 5.4  HGB 11.6* 11.1*  HCT 35.1* 34.5*  MCV 85.0 85.8  PLT 190 208   Cardiac Enzymes: No results for input(s): CKTOTAL, CKMB, CKMBINDEX, TROPONINI in the last 72 hours. BNP: Invalid input(s): POCBNP D-Dimer: No results for input(s): DDIMER in the last 72 hours. Hemoglobin A1C: No results for input(s): HGBA1C in the last 72 hours. Fasting Lipid Panel: No results for input(s): CHOL, HDL, LDLCALC,  TRIG, CHOLHDL, LDLDIRECT in the last 72 hours. Thyroid Function Tests: No results for input(s): TSH, T4TOTAL, T3FREE, THYROIDAB in the last 72 hours.  Invalid input(s): FREET3 Anemia Panel: No results for input(s): VITAMINB12, FOLATE, FERRITIN, TIBC, IRON, RETICCTPCT in the last 72 hours.  No results found.   Echo: Echocardiogram 2D complete: (08/2019) IMPRESSIONS  1. Left ventricular ejection fraction, by visual estimation, is 55 to  60%. The left ventricle has normal function. There is no left ventricular  hypertrophy.  2. Definity  contrast agent was given IV to delineate the left ventricular  endocardial borders.  3. The left ventricle has no regional wall motion abnormalities.  4. Global right ventricle has normal systolic function.The right  ventricular size is normal. No increase in right ventricular wall  thickness.  5. Left atrial size was normal.  6. Right atrial size was normal.  7. The mitral valve is normal in structure. Trivial mitral valve  regurgitation.  8. The tricuspid valve is normal in structure.  9. The tricuspid valve is normal in structure. Tricuspid valve  regurgitation is trivial.  10. The aortic valve is normal in structure. Aortic valve regurgitation is  not visualized.  11. The pulmonic valve was normal in structure. Pulmonic valve  regurgitation is not visualized.   TELEMETRY: SB with HR of 58 bpm   ASSESSMENT AND PLAN:  Principal Problem:   Hypotension Active Problems:   Acute kidney injury (HCC)   Sinus bradycardia   MDD (major depressive disorder), recurrent episode, severe (HCC)   Dementia with behavioral disturbance (HCC)   Type 2 diabetes mellitus with hyperlipidemia (HCC)   History of breast cancer   Essential hypertension   Post-surgical hypothyroidism   CKD (chronic kidney disease) stage 3, GFR 30-59 ml/min   AKI (acute kidney injury) ([place])    PLAN:   Sinus Bradycardia, unclear etiology, reasonably stable, asymptomatic              -Permanent pacemaker not indicated at this time.             -Please continue to avoid beta blockers or the use of any medications that have the potential to exacerbate bradycardia.             -Recommendation: The patient should follow up with Dr. [name] as an outpatient 1 week after discharge for possible stress test and further evaluation regarding the indications for possible pacemaker placement in the near-future.              -ECG has been ordered, but has yet to be performed   Hypotension with h/o essential HTN, likely due  to anti-hypertensive medications in the setting of decreased oral intake and AKI, reasonably stable, recurrent issue             -Agree with Amlodipine 5mg , may increase to 10mg  if HTN is poorly controlled   -Monitor BP q4h   AKI in the setting of CKD stage 3, kidney functioning is back to baseline             -We are in agreement with current plan  DM Type II, reasonably stable, Hg A1c=6.2, Metformin and Linagliptin is being held due to AKI             -We are in agreement with current plan              MDD, reasonably stable, managed by Psychiatry             -Please  continue plan as detailed by Psychiatry and avoid any medications that can further exacerbate bradycardia    PATIENT IS [place].   Omie Ferger, ACNPC-AG  11/11/2019 11:35 AM

## 2019-11-11 NOTE — BH Assessment (Signed)
Follow up contact was made at the following facilities:  . [facility] [phone]), Denial due to aggression and dementia  . [facility] ([phone]), No answer in the intake department  . [facility] [phone], [phone] or [phone]), No current intake staff until after 8am  . [facility] [phone] -or- [phone]), Denied due to Medical for dementia  . [facility] [phone] or [phone])  . [facility] [phone] or [phone]),   . [facility] [phone]). No current intake staff, voicemail was left    [place] [phone])

## 2019-11-11 NOTE — Progress Notes (Signed)
Physical Therapy Treatment Patient Details Name: Melanie Young MRN: [id number] DOB: May 15, 1941 Today's Date: 11/11/2019    History of Present Illness Patient is a 79 y/o F that returned to ED from facility due to AMS and bradycardia. She has been quite emotional, noted to have been tearful throughout much of the week she has been in ED. Per RN, she has been having difficulty with transfers and ambulation.    PT Comments    Pt was seated on side of bed upon arriving. She greets therapist upon entry and states, " I can't do it today." With encouragement, pt does agree to trial standing and working. She stood 3 x EOB from slightly elevated surface height. HR between 58-77 bpm throughout. Pt very resistive to therapy throughout but once motivated to desired task does give effort however therapist questions if max effort is given. She does continue to c/o pain in BLE (knees). She also demonstrated ability to lateral scoot along EOB without assistance. Pt left sitting on EOB with sitter present and call bell in reach. Acute PT will continue to progress pt as able per POC.       Follow Up Recommendations  SNF     Equipment Recommendations  Rolling walker with 5" wheels    Recommendations for Other Services       Precautions / Restrictions Precautions Precautions: Fall Restrictions Weight Bearing Restrictions: No    Mobility  Bed Mobility               General bed mobility comments: pt was seated EOB pre/post session with sitter present  Transfers Overall transfer level: Needs assistance Equipment used: Rolling walker (2 wheeled) Transfers: Sit to/from Stand Sit to Stand: From elevated surface;Mod assist;Min assist         General transfer comment: pt was able to stand 4 x EOB with increased time and max encouragement. she continues to state she can not stand however is able to stand from elevated surface with mod assist. Several time pt demonstrates abilty to clear hips and  lateral scoot "mini stand" without assistance. Pt very resistive between standing trials to continue working with therapist.   Ambulation/Gait             General Gait Details: unsafe/pt unwilling   Stairs             Wheelchair Mobility    Modified Rankin (Stroke Patients Only)       Balance Overall balance assessment: Modified Independent   Sitting balance-Leahy Scale: Good Sitting balance - Comments: pt was seated EOB pre/post session without assistance required to maintain balance. she demnstarted safe ability to reach outside BOS and correct without difficulty   Standing balance support: Bilateral upper extremity supported Standing balance-Leahy Scale: Poor Standing balance comment: Severe kyphotic posture noted, unable to safely take any steps with RW.                            Cognition Arousal/Alertness: Awake/alert Behavior During Therapy: WFL for tasks assessed/performed Overall Cognitive Status: Impaired/Different from baseline Area of Impairment: Safety/judgement;Problem solving;Awareness                         Safety/Judgement: Decreased awareness of safety;Decreased awareness of deficits Awareness: Intellectual Problem Solving: Requires tactile cues;Requires verbal cues;Difficulty sequencing;Decreased initiation;Slow processing General Comments: Pt continues to have poor insight of her limtation and abilities. She was alert and cooperative with encouragement  Exercises      General Comments        Pertinent Vitals/Pain Pain Assessment: Faces Faces Pain Scale: Hurts even more Pain Location: BLE knee pain Pain Descriptors / Indicators: Aching;Grimacing;Crying;Moaning Pain Intervention(s): Monitored during session;Limited activity within patient's tolerance    Home Living                      Prior Function            PT Goals (current goals can now be found in the care plan section) Acute Rehab PT  Goals Patient Stated Goal: " I still can't stand but I wish i could" Progress towards PT goals: Progressing toward goals    Frequency    Min 2X/week      PT Plan Current plan remains appropriate    Co-evaluation              AM-PAC PT "6 Clicks" Mobility   Outcome Measure  Help needed turning from your back to your side while in a flat bed without using bedrails?: A Little Help needed moving from lying on your back to sitting on the side of a flat bed without using bedrails?: A Little Help needed moving to and from a bed to a chair (including a wheelchair)?: A Lot Help needed standing up from a chair using your arms (e.g., wheelchair or bedside chair)?: A Lot Help needed to walk in hospital room?: A Lot Help needed climbing 3-5 steps with a railing? : Total 6 Click Score: 13    End of Session Equipment Utilized During Treatment: Gait belt Activity Tolerance: Patient limited by pain;Patient limited by fatigue Patient left: in bed;with bed alarm set;with nursing/sitter in room;with call bell/phone within reach Nurse Communication: Mobility status PT Visit Diagnosis: Difficulty in walking, not elsewhere classified (R26.2);History of falling (Z91.81)     Time: [id number] PT Time Calculation (min) (ACUTE ONLY): 18 min  Charges:  $Therapeutic Activity: 8-22 mins                     Julaine Fusi PTA 11/11/19, 2:16 PM

## 2019-11-11 NOTE — Progress Notes (Signed)
PROGRESS NOTE    Melanie Young  [id number] DOB: 08-12-1941 DOA: 11/03/2019 PCP: System, Pcp Not In     Assessment & Plan:   Principal Problem:   Hypotension Active Problems:   Acute kidney injury ([place])   Sinus bradycardia   MDD (major depressive disorder), recurrent episode, severe ([place])   Dementia with behavioral disturbance ([place])   Type 2 diabetes mellitus with hyperlipidemia ([place])   History of breast cancer   Essential hypertension   Post-surgical hypothyroidism   CKD (chronic kidney disease) stage 3, GFR 30-59 ml/min   AKI (acute kidney injury) ([place])  Major depressive disorder: recurrent episode, severe. Continue buspar, lamictal and seroquel as started by psych service in the emergency room. Medically stable for d/c to inpatient psych   Dementia with behavioral disturbance: behavioral health consult for continued management. Medically stable for d/c to inpatient psych   AKI on CKDIIIa: Cr is better than baseline. Continue to hold metformin, torsemide, linagliptin, losartan, lyrica and oxybutynin.   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 Avoid avn blockers or other potential meds to exacerbate it. Pt evaluated for presyncope related to symptomatic bradycardia back in December. Had an unremarkable echocardiogram. Pacemaker was not felt necessary at the time. Continue tele. Should follow up with Dr. [name] as an outpatient 1 week after discharge for possible stress test and further evaluation regarding the indications for possible pacemaker placement in the near-future.   DM2: pretty well controlled w/ HbA1c 6.2. Continue to hold home metformin and linagliptin due to renal function. Continue on SSI w/ accuchecks   History of breast cancer: no acute concerns at this time  Post-surgical hypothyroidism: continue levothyroxine  Hypertension: continue on amlodipine. Hydralazine prn   Hypotension: resolved  DVT prophylaxis:  lovenox Code Status: full  Family Communication:  Disposition Plan: medically stable for transfer to inpatient psych; contacted psych but cannot take geriatric pts so CM is working on placement at another inpatient psych facility still    Consultants:   Psych  cardio   Procedures:    Antimicrobials:    Subjective: Pt c/o fatigue  Objective: Vitals:   11/10/19 1646 11/10/19 1945 11/11/19 0524 11/11/19 0725  BP: 133/85 (!) 162/71 (!) 153/72 (!) 145/78  Pulse: (!) 49 (!) 54 61 61  Resp:  18 18 18   Temp: 98.5 F (36.9 C) 98.5 F (36.9 C) 98.2 F (36.8 C) 98.5 F (36.9 C)  TempSrc: Oral Oral Oral Oral  SpO2: 96% 98% 94% 98%  Weight:   107.4 kg   Height:        Intake/Output Summary (Last 24 hours) at 11/11/2019 0805 Last data filed at 11/11/2019 0602 Gross per 24 hour  Intake --  Output 1600 ml  Net -1600 ml   Filed Weights   11/04/19 0741 11/09/19 0455 11/11/19 0524  Weight: 105.8 kg 108.2 kg 107.4 kg    Examination:  General exam: Appears calm and comfortable  Respiratory system: Clear to auscultation. No rales.  Cardiovascular system: S1 & S2 +. No rubs, gallops or clicks.  Gastrointestinal system: Abdomen is obese, soft and nontender. Hypoactive bowel sounds heard. Central nervous system: Alert and oriented. Moves all 4 extremities  Psychiatry: Judgement and insight appear normal. Flat mood and affect.     Data Reviewed: I have personally reviewed following labs and imaging studies  CBC: Recent Labs  Lab 11/06/19 2149 11/07/19 0457 11/10/19 0526 11/11/19 0641  WBC 6.2 5.2 5.2 5.4  HGB 10.7* 10.8* 11.6* 11.1*  HCT 34.7* 33.5* 35.1* 34.5*  MCV 89.2 85.5 85.0 85.8  PLT 187 179 190 [id number]   Basic Metabolic Panel: Recent Labs  Lab 11/07/19 0457 11/08/19 0850 11/09/19 0712 11/10/19 0526 11/11/19 0641  NA 141 140 139 139 141  K 4.2 4.2 4.0 4.0 4.4  CL 109 109 108 107 109  CO2 25 24 23 25 25   GLUCOSE 138* 127* 106* 97 104*  BUN 32* 19 16 13  16   CREATININE 2.15* 1.11* 0.97 0.81 0.89  CALCIUM 8.8* 8.8* 8.7* 9.1 8.9   GFR: Estimated Creatinine Clearance: 60.8 mL/min (by C-G formula based on SCr of 0.89 mg/dL). Liver Function Tests: No results for input(s): AST, ALT, ALKPHOS, BILITOT, PROT, ALBUMIN in the last 168 hours. No results for input(s): LIPASE, AMYLASE in the last 168 hours. No results for input(s): AMMONIA in the last 168 hours. Coagulation Profile: No results for input(s): INR, PROTIME in the last 168 hours. Cardiac Enzymes: No results for input(s): CKTOTAL, CKMB, CKMBINDEX, TROPONINI in the last 168 hours. BNP (last 3 results) No results for input(s): PROBNP in the last 8760 hours. HbA1C: No results for input(s): HGBA1C in the last 72 hours. CBG: Recent Labs  Lab 11/10/19 0752 11/10/19 1214 11/10/19 1714 11/10/19 2119 11/11/19 0728  GLUCAP 94 100* 115* 118* 109*   Lipid Profile: No results for input(s): CHOL, HDL, LDLCALC, TRIG, CHOLHDL, LDLDIRECT in the last 72 hours. Thyroid Function Tests: No results for input(s): TSH, T4TOTAL, FREET4, T3FREE, THYROIDAB in the last 72 hours. Anemia Panel: No results for input(s): VITAMINB12, FOLATE, FERRITIN, TIBC, IRON, RETICCTPCT in the last 72 hours. Sepsis Labs: No results for input(s): PROCALCITON, LATICACIDVEN in the last 168 hours.  Recent Results (from the past 240 hour(s))  Respiratory Panel by RT PCR (Flu A&B, Covid) - Nasopharyngeal Swab     Status: None   Collection Time: 11/03/19  3:24 PM   Specimen: Nasopharyngeal Swab  Result Value Ref Range Status   SARS Coronavirus 2 by RT PCR NEGATIVE NEGATIVE Final    Comment: (NOTE) SARS-CoV-2 target nucleic acids are NOT DETECTED. The SARS-CoV-2 RNA is generally detectable in upper respiratoy specimens during the acute phase of infection. The lowest concentration of SARS-CoV-2 viral copies this assay can detect is 131 copies/mL. A negative result does not preclude SARS-Cov-2 infection and should not be  used as the sole basis for treatment or other patient management decisions. A negative result may occur with  improper specimen collection/handling, submission of specimen other than nasopharyngeal swab, presence of viral mutation(s) within the areas targeted by this assay, and inadequate number of viral copies (<131 copies/mL). A negative result must be combined with clinical observations, patient history, and epidemiological information. The expected result is Negative. Fact Sheet for Patients:  PinkCheek.be Fact Sheet for Healthcare Providers:  GravelBags.it This test is not yet ap proved or cleared by the [place] FDA and  has been authorized for detection and/or diagnosis of SARS-CoV-2 by FDA under an Emergency Use Authorization (EUA). This EUA will remain  in effect (meaning this test can be used) for the duration of the COVID-19 declaration under Section 564(b)(1) of the Act, 21 U.S.C. section 360bbb-3(b)(1), unless the authorization is terminated or revoked sooner.    Influenza A by PCR NEGATIVE NEGATIVE Final   Influenza B by PCR NEGATIVE NEGATIVE Final    Comment: (NOTE) The Xpert Xpress SARS-CoV-2/FLU/RSV assay is intended as an aid in  the diagnosis of influenza from Nasopharyngeal swab specimens and  should not  be used as a sole basis for treatment. Nasal washings and  aspirates are unacceptable for Xpert Xpress SARS-CoV-2/FLU/RSV  testing. Fact Sheet for Patients: PinkCheek.be Fact Sheet for Healthcare Providers: GravelBags.it This test is not yet approved or cleared by the [place] FDA and  has been authorized for detection and/or diagnosis of SARS-CoV-2 by  FDA under an Emergency Use Authorization (EUA). This EUA will remain  in effect (meaning this test can be used) for the duration of the  Covid-19 declaration under Section 564(b)(1) of the  Act, 21  U.S.C. section 360bbb-3(b)(1), unless the authorization is  terminated or revoked. Performed at [facility], [address] [place], [place] [address]   MRSA PCR Screening     Status: None   Collection Time: 11/07/19 12:47 AM   Specimen: Nasopharyngeal  Result Value Ref Range Status   MRSA by PCR NEGATIVE NEGATIVE Final    Comment:        The GeneXpert MRSA Assay (FDA approved for NASAL specimens only), is one component of a comprehensive MRSA colonization surveillance program. It is not intended to diagnose MRSA infection nor to guide or monitor treatment for MRSA infections. Performed at [facility], [address] [place], [place] [address]          Radiology Studies: No results found.      Scheduled Meds: . amLODipine  5 mg Oral Daily  . aspirin EC  81 mg Oral Daily  . atorvastatin  40 mg Oral QHS  . busPIRone  10 mg Oral BID  . enoxaparin (LOVENOX) injection  40 mg Subcutaneous Q24H  . insulin aspart  0-20 Units Subcutaneous TID WC  . insulin aspart  0-5 Units Subcutaneous QHS  . lamoTRIgine  200 mg Oral Daily  . levothyroxine  200 mcg Oral Q0600  . pantoprazole  40 mg Oral Daily  . QUEtiapine  25 mg Oral BID  . sertraline  25 mg Oral Daily   Continuous Infusions:   LOS: 3 days    Time spent: 30 mins    Wyvonnia Dusky, MD [facility] Pager [phone]  If 7PM-7AM, please contact night-coverage [email or web address] 11/11/2019, 8:05 AM

## 2019-11-12 DIAGNOSIS — F331 Major depressive disorder, recurrent, moderate: Secondary | ICD-10-CM | POA: Diagnosis present

## 2019-11-12 DIAGNOSIS — F0391 Unspecified dementia with behavioral disturbance: Secondary | ICD-10-CM

## 2019-11-12 LAB — BASIC METABOLIC PANEL
Anion gap: 9 (ref 5–15)
BUN: 17 mg/dL (ref 8–23)
CO2: 24 mmol/L (ref 22–32)
Calcium: 9.2 mg/dL (ref 8.9–10.3)
Chloride: 106 mmol/L (ref 98–111)
Creatinine, Ser: 0.93 mg/dL (ref 0.44–1.00)
GFR calc Af Amer: >60 mL/min (ref >60)
GFR calc non Af Amer: 60 mL/min — ABNORMAL LOW (ref >60)
Glucose, Bld: 98 mg/dL (ref 70–99)
Potassium: 3.9 mmol/L (ref 3.5–5.1)
Sodium: 139 mmol/L (ref 135–145)

## 2019-11-12 LAB — CBC
HCT: 36.3 % (ref 36.0–46.0)
Hemoglobin: 11.9 g/dL — ABNORMAL LOW (ref 12.0–15.0)
MCH: 27.4 pg (ref 26.0–34.0)
MCHC: 32.8 g/dL (ref 30.0–36.0)
MCV: 83.4 fL (ref 80.0–100.0)
Platelets: 221 10*3/uL (ref 150–400)
RBC: 4.35 MIL/uL (ref 3.87–5.11)
RDW: 15.4 % (ref 11.5–15.5)
WBC: 4.9 10*3/uL (ref 4.0–10.5)
nRBC: 0 % (ref 0.0–0.2)

## 2019-11-12 LAB — GLUCOSE, CAPILLARY
Glucose-Capillary: 102 mg/dL — ABNORMAL HIGH (ref 70–99)
Glucose-Capillary: 130 mg/dL — ABNORMAL HIGH (ref 70–99)
Glucose-Capillary: 115 mg/dL — ABNORMAL HIGH (ref 70–99)
Glucose-Capillary: 124 mg/dL — ABNORMAL HIGH (ref 70–99)

## 2019-11-12 NOTE — Progress Notes (Signed)
PROGRESS NOTE    Melanie Young  [id number] DOB: 1941-05-17 DOA: 11/03/2019 PCP: System, Pcp Not In     Assessment & Plan:   Principal Problem:   Hypotension Active Problems:   Acute kidney injury ([place])   Sinus bradycardia   MDD (major depressive disorder), recurrent episode, severe ([name])   Dementia with behavioral disturbance ([name])   Type 2 diabetes mellitus with hyperlipidemia ([name])   History of breast cancer   Essential hypertension   Post-surgical hypothyroidism   CKD (chronic kidney disease) stage 3, GFR 30-59 ml/min   AKI (acute kidney injury) ([name])  Major depressive disorder: recurrent episode, severe. Continue buspar, lamictal and seroquel as started by psych service in the emergency room. Medically stable for d/c to inpatient psych   Dementia with behavioral disturbance: behavioral health consult for continued management. Medically stable for d/c to inpatient psych   AKI on CKDIIIa: Cr is better than baseline. Continue to hold metformin, torsemide, linagliptin, losartan, lyrica and oxybutynin.   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 Avoid avn blockers or other potential meds to exacerbate it. Pt evaluated for presyncope related to symptomatic bradycardia back in December. Had an unremarkable echocardiogram. Pacemaker was not felt necessary at the time. Continue tele. Should follow up with Dr. [name] as an outpatient 1 week after discharge for possible stress test and further evaluation regarding the indications for possible pacemaker placement in the near-future.   DM2: pretty well controlled w/ HbA1c 6.2. Continue to hold home metformin and linagliptin due to renal function. Continue on SSI w/ accuchecks   History of breast cancer: no acute concerns at this time  Post-surgical hypothyroidism: continue levothyroxine  Hypertension: continue on amlodipine. Hydralazine prn   Hypotension: resolved  DVT prophylaxis:  lovenox Code Status: full  Family Communication:  Disposition Plan: medically stable for transfer to inpatient psych but psych now saying that pt does not meet criteria for inpatient psych facility. PT recs SNF. Will discuss w/ CM   Consultants:   Psych  cardio   Procedures:    Antimicrobials:    Subjective: Pt c/o malaise   Objective: Vitals:   11/11/19 1054 11/11/19 1616 11/11/19 2025 11/12/19 0427  BP: (!) 163/71 (!) 177/79 (!) 157/91 (!) 145/71  Pulse: 61 66 81 (!) 54  Resp: 18 18 18 18   Temp: 98.4 F (36.9 C) 98.4 F (36.9 C) 98.3 F (36.8 C) 98.5 F (36.9 C)  TempSrc: Oral  Oral Oral  SpO2: 96% 95% 100% 95%  Weight:      Height:        Intake/Output Summary (Last 24 hours) at 11/12/2019 0818 Last data filed at 11/12/2019 0616 Gross per 24 hour  Intake 480 ml  Output 1150 ml  Net -670 ml   Filed Weights   11/04/19 0741 11/09/19 0455 11/11/19 0524  Weight: 105.8 kg 108.2 kg 107.4 kg    Examination:  General exam: Appears calm and comfortable  Respiratory system: Clear to auscultation. No wheeze, rhonchi. B/L LE edema Cardiovascular system: S1 & S2 +. No rubs, gallops or clicks.  Gastrointestinal system: Abdomen is obese, soft and nontender. Hypoactive bowel sounds heard. Central nervous system: Alert and oriented. Moves all 4 extremities  Psychiatry: Judgement and insight appear normal. Flat mood and affect.     Data Reviewed: I have personally reviewed following labs and imaging studies  CBC: Recent Labs  Lab 11/06/19 2149 11/07/19 0457 11/10/19 0526 11/11/19 0641 11/12/19 0538  WBC 6.2 5.2 5.2  5.4 4.9  HGB 10.7* 10.8* 11.6* 11.1* 11.9*  HCT 34.7* 33.5* 35.1* 34.5* 36.3  MCV 89.2 85.5 85.0 85.8 83.4  PLT 187 179 190 208 [id number]   Basic Metabolic Panel: Recent Labs  Lab 11/08/19 0850 11/09/19 0712 11/10/19 0526 11/11/19 0641 11/12/19 0538  NA 140 139 139 141 139  K 4.2 4.0 4.0 4.4 3.9  CL 109 108 107 109 106  CO2 24 23 25 25 24     GLUCOSE 127* 106* 97 104* 98  BUN 19 16 13 16 17   CREATININE 1.11* 0.97 0.81 0.89 0.93  CALCIUM 8.8* 8.7* 9.1 8.9 9.2   GFR: Estimated Creatinine Clearance: 58.2 mL/min (by C-G formula based on SCr of 0.93 mg/dL). Liver Function Tests: No results for input(s): AST, ALT, ALKPHOS, BILITOT, PROT, ALBUMIN in the last 168 hours. No results for input(s): LIPASE, AMYLASE in the last 168 hours. No results for input(s): AMMONIA in the last 168 hours. Coagulation Profile: No results for input(s): INR, PROTIME in the last 168 hours. Cardiac Enzymes: No results for input(s): CKTOTAL, CKMB, CKMBINDEX, TROPONINI in the last 168 hours. BNP (last 3 results) No results for input(s): PROBNP in the last 8760 hours. HbA1C: No results for input(s): HGBA1C in the last 72 hours. CBG: Recent Labs  Lab 11/10/19 2119 11/11/19 0728 11/11/19 1112 11/11/19 1617 11/11/19 2205  GLUCAP 118* 109* 100* 105* 108*   Lipid Profile: No results for input(s): CHOL, HDL, LDLCALC, TRIG, CHOLHDL, LDLDIRECT in the last 72 hours. Thyroid Function Tests: No results for input(s): TSH, T4TOTAL, FREET4, T3FREE, THYROIDAB in the last 72 hours. Anemia Panel: No results for input(s): VITAMINB12, FOLATE, FERRITIN, TIBC, IRON, RETICCTPCT in the last 72 hours. Sepsis Labs: No results for input(s): PROCALCITON, LATICACIDVEN in the last 168 hours.  Recent Results (from the past 240 hour(s))  Respiratory Panel by RT PCR (Flu A&B, Covid) - Nasopharyngeal Swab     Status: None   Collection Time: 11/03/19  3:24 PM   Specimen: Nasopharyngeal Swab  Result Value Ref Range Status   SARS Coronavirus 2 by RT PCR NEGATIVE NEGATIVE Final    Comment: (NOTE) SARS-CoV-2 target nucleic acids are NOT DETECTED. The SARS-CoV-2 RNA is generally detectable in upper respiratoy specimens during the acute phase of infection. The lowest concentration of SARS-CoV-2 viral copies this assay can detect is 131 copies/mL. A negative result does not  preclude SARS-Cov-2 infection and should not be used as the sole basis for treatment or other patient management decisions. A negative result may occur with  improper specimen collection/handling, submission of specimen other than nasopharyngeal swab, presence of viral mutation(s) within the areas targeted by this assay, and inadequate number of viral copies (<131 copies/mL). A negative result must be combined with clinical observations, patient history, and epidemiological information. The expected result is Negative. Fact Sheet for Patients:  PinkCheek.be Fact Sheet for Healthcare Providers:  GravelBags.it This test is not yet ap proved or cleared by the [place] FDA and  has been authorized for detection and/or diagnosis of SARS-CoV-2 by FDA under an Emergency Use Authorization (EUA). This EUA will remain  in effect (meaning this test can be used) for the duration of the COVID-19 declaration under Section 564(b)(1) of the Act, 21 U.S.C. section 360bbb-3(b)(1), unless the authorization is terminated or revoked sooner.    Influenza A by PCR NEGATIVE NEGATIVE Final   Influenza B by PCR NEGATIVE NEGATIVE Final    Comment: (NOTE) The Xpert Xpress SARS-CoV-2/FLU/RSV assay is intended as an aid  in  the diagnosis of influenza from Nasopharyngeal swab specimens and  should not be used as a sole basis for treatment. Nasal washings and  aspirates are unacceptable for Xpert Xpress SARS-CoV-2/FLU/RSV  testing. Fact Sheet for Patients: PinkCheek.be Fact Sheet for Healthcare Providers: GravelBags.it This test is not yet approved or cleared by the [place] FDA and  has been authorized for detection and/or diagnosis of SARS-CoV-2 by  FDA under an Emergency Use Authorization (EUA). This EUA will remain  in effect (meaning this test can be used) for the duration of the    Covid-19 declaration under Section 564(b)(1) of the Act, 21  U.S.C. section 360bbb-3(b)(1), unless the authorization is  terminated or revoked. Performed at [facility], [address] [place], [place] [address]   MRSA PCR Screening     Status: None   Collection Time: 11/07/19 12:47 AM   Specimen: Nasopharyngeal  Result Value Ref Range Status   MRSA by PCR NEGATIVE NEGATIVE Final    Comment:        The GeneXpert MRSA Assay (FDA approved for NASAL specimens only), is one component of a comprehensive MRSA colonization surveillance program. It is not intended to diagnose MRSA infection nor to guide or monitor treatment for MRSA infections. Performed at [facility], [address] [place],  [address]          Radiology Studies: No results found.      Scheduled Meds: . amLODipine  5 mg Oral Daily  . aspirin EC  81 mg Oral Daily  . atorvastatin  40 mg Oral QHS  . busPIRone  10 mg Oral BID  . enoxaparin (LOVENOX) injection  40 mg Subcutaneous Q24H  . insulin aspart  0-20 Units Subcutaneous TID WC  . insulin aspart  0-5 Units Subcutaneous QHS  . lamoTRIgine  200 mg Oral Daily  . levothyroxine  200 mcg Oral Q0600  . pantoprazole  40 mg Oral Daily  . QUEtiapine  25 mg Oral BID  . sertraline  25 mg Oral Daily   Continuous Infusions:   LOS: 4 days    Time spent: 30 mins    Wyvonnia Dusky, MD [facility] Pager [phone]  If 7PM-7AM, please contact night-coverage [email or web address] 11/12/2019, 8:18 AM

## 2019-11-12 NOTE — TOC Progression Note (Signed)
Transition of Care [facility]) - Progression Note    Patient Details  Name: Melanie Young MRN: [id number] Date of Birth: Jan 28, 1941  Transition of Care [facility] CM/SW Contact  Marshell Garfinkel, RN Phone Number: 11/12/2019, 2:44 PM  Clinical Narrative:     Phoebe Perch created for signature; PAS[facility] requesting more information. SNF bed search.        Expected Discharge Plan and Services                                                 Social Determinants of Health (SDOH) Interventions    Readmission Risk Interventions No flowsheet data found.

## 2019-11-12 NOTE — NC FL2 (Signed)
[place] LEVEL OF CARE SCREENING TOOL     IDENTIFICATION  Patient Name: Melanie Young Birthdate: 01/18/1941 Sex: female Admission Date (Current Location): 11/03/2019  [place] and [place] Number:  Engineering geologist and Address:  [facility] [address], [place], [place] [address]      Provider Number: [id number]  Attending Physician Name and Address:  Wyvonnia Dusky, MD  Relative Name and Phone Number:       Current Level of Care: Hospital Recommended Level of Care: [facility] Prior Approval Number:    Date Approved/Denied:   PASRR Number:    Discharge Plan: SNF    Current Diagnoses: Patient Active Problem List   Diagnosis Date Noted  . Major depressive disorder, recurrent episode, moderate ([place]) 11/12/2019  . AKI (acute kidney injury) ([place]) 11/08/2019  . Dementia with behavioral disturbance ([place]) 11/06/2019  . Type 2 diabetes mellitus with hyperlipidemia ([place]) 11/06/2019  . History of breast cancer 11/06/2019  . Hypotension 11/06/2019  . Essential hypertension 11/06/2019  . Post-surgical hypothyroidism 11/06/2019  . CKD (chronic kidney disease) stage 3, GFR 30-59 ml/min 11/06/2019  . Sinus bradycardia 09/10/2019  . Fall   . Dizziness   . Urinary tract infection in elderly patient   . Acute kidney injury ([place]) 08/10/2017    Orientation RESPIRATION BLADDER Height & Weight     Self  Normal Continent Weight: 107.4 kg Height:  5\' 1"  (154.9 cm)  BEHAVIORAL SYMPTOMS/MOOD NEUROLOGICAL BOWEL NUTRITION STATUS      Continent Diet  AMBULATORY STATUS COMMUNICATION OF NEEDS Skin   Extensive Assist Verbally Normal                       Personal Care Assistance Level of Assistance  Bathing, Feeding, Dressing   Feeding assistance: Limited assistance Dressing Assistance: Limited assistance     Functional Limitations Info  Sight, Hearing, Speech Sight Info: Adequate Hearing Info: Adequate Speech  Info: Adequate    SPECIAL CARE FACTORS FREQUENCY                       Contractures Contractures Info: Not present    Additional Factors Info  Code Status Code Status Info: full             Current Medications (11/12/2019):  This is the current hospital active medication list Current Facility-Administered Medications  Medication Dose Route Frequency Provider Last Rate Last Admin  . acetaminophen (TYLENOL) tablet 650 mg  650 mg Oral Q6H PRN Athena Masse, MD   650 mg at 11/12/19 1330   Or  . acetaminophen (TYLENOL) suppository 650 mg  650 mg Rectal Q6H PRN Athena Masse, MD      . amLODipine (NORVASC) tablet 5 mg  5 mg Oral Daily Nolberto Hanlon, MD   5 mg at 11/12/19 0945  . aspirin EC tablet 81 mg  81 mg Oral Daily Athena Masse, MD   81 mg at 11/12/19 0945  . atorvastatin (LIPITOR) tablet 40 mg  40 mg Oral QHS Athena Masse, MD   40 mg at 11/11/19 2214  . busPIRone (BUSPAR) tablet 10 mg  10 mg Oral BID Athena Masse, MD   10 mg at 11/12/19 0945  . diclofenac Sodium (VOLTAREN) 1 % topical gel 2 g  2 g Topical BID PRN Athena Masse, MD   2 g at 11/11/19 2215  . enoxaparin (LOVENOX) injection 40 mg  40 mg Subcutaneous Q24H Rowland Lathe, RPH   40 mg at 11/12/19 [id number]  . hydrALAZINE (APRESOLINE) tablet 25 mg  25 mg Oral Q6H PRN Wyvonnia Dusky, MD   25 mg at 11/10/19 2027  . insulin aspart (novoLOG) injection 0-20 Units  0-20 Units Subcutaneous TID WC Athena Masse, MD   3 Units at 11/12/19 1243  . insulin aspart (novoLOG) injection 0-5 Units  0-5 Units Subcutaneous QHS Judd Gaudier V, MD      . lamoTRIgine (LAMICTAL) tablet 200 mg  200 mg Oral Daily Athena Masse, MD   200 mg at 11/12/19 0945  . levothyroxine (SYNTHROID) tablet 200 mcg  200 mcg Oral Q0600 Athena Masse, MD   200 mcg at 11/12/19 0604  . ondansetron (ZOFRAN) tablet 4 mg  4 mg Oral Q6H PRN Athena Masse, MD       Or  . ondansetron [facility]) injection 4 mg  4 mg Intravenous Q6H PRN Athena Masse, MD      . pantoprazole (PROTONIX) EC tablet 40 mg  40 mg Oral Daily Athena Masse, MD   40 mg at 11/12/19 0945  . QUEtiapine (SEROQUEL) tablet 25 mg  25 mg Oral BID Athena Masse, MD   25 mg at 11/12/19 0945  . senna-docusate (Senokot-S) tablet 1 tablet  1 tablet Oral QHS PRN Athena Masse, MD      . sertraline (ZOLOFT) tablet 25 mg  25 mg Oral Daily Athena Masse, MD   25 mg at 11/12/19 0945     Discharge Medications: Please see discharge summary for a list of discharge medications.  Relevant Imaging Results:  Relevant Lab Results:   Additional Information SS# [id number]  Marshell Garfinkel, RN

## 2019-11-12 NOTE — Consult Note (Signed)
[place] Psychiatry Consult   Reason for Consult:  Depression  Referring Physician:  Judd Gaudier Patient Identification: Melanie Young MRN:  [id number] Principal Diagnosis: Hypotension Diagnosis:  Principal Problem:   Hypotension Active Problems:   Major depressive disorder, recurrent episode, moderate (HCC)   Acute kidney injury ([place])   Sinus bradycardia   Dementia with behavioral disturbance ([place])   Type 2 diabetes mellitus with hyperlipidemia ([place])   History of breast cancer   Essential hypertension   Post-surgical hypothyroidism   CKD (chronic kidney disease) stage 3, GFR 30-59 ml/min   AKI (acute kidney injury) ([place])  Total Time spent with patient: 45 minutes  Subjective:   Melanie Young is a 79 y.o. female patient admitted with AKI and severe depression, history of hypothyroidism and dementia.  Patient seen and evaluated in person by this provider for depression and anxiety.  Initially, she came into the emergency department for aggression at her memory care facility and later admitted for medical reasons.  Her agitation was managed with medication and stabilized.  She continued to have depression and anxiety along with suicidal ideations.  Her medications were adjusted for hypothyroidism and mood.  Today she was sitting on the side of her bed and dressed in nonhospital clothes.  She reports feeling tired and based on previous assessments her mood has improved.  She described her mood as "just stress and stress and stress".  Denies suicidal thoughts and reports when she is anxious she does have some negative thoughts that she just pushes out of her mind.  No homicidal thoughts or hallucinations or aggression.  When discussed where she is living, she states "I do not want to go back."  This appears to be her issue at this time as she feels they take all of her freedom away along with her belongings.  Discussed her presentation with her current nurse, who reports that she is  much improved since she has had her a few days ago.  No longer a safety concern and psychiatrically cleared at this time.  Dr. Jake Samples did reviewed this client and concurs with this plan.  11/06/19:   Melanie Young to evaluate patient again today and she is still tearful she is unable to communicate today.  Patient sitter states that the patient has been very emotional today.  Patient does not her head of having any side effects due to starting Zoloft.  Labs were ordered yesterday for thyroid panel and patient's TSH was elevated more at 7.360.  Discussed this with Dr. Mallie Darting and the decision was made to recommend to increase the Synthroid to 200 mcg daily.  Discussed this with Dr. Jacqualine Code and he was in agreement for the Synthroid to be increased.  At this current time patient will continue Zoloft 50 mg daily along with her other medications as well as increasing her Synthroid to 200 mcg a day.  Melanie Young is a 79 y.o. female patient reports today that she still feels very depressed.  She states that isn't that she did not like the facility.  She reports that the facility is a very nice place and the staff there worked extremely hard to help all of the patients that are there.  She states that she just feels that she is a burden on them because she has started having more issues.  She states that she feels that she has a burden on everyone else and that there is no reason to live anymore.  She states that she is not even  sure that another facility would better assist her or make her feel any better.  She reports that she feels that maybe been admitted to inpatient psychiatric unit could possibly improve her mood with the right medications and therapy.  She states that she does have people to talk to at the facility and she is on medications for her depression and anxiety but it does not seem to be getting any better.  She continues to report multiple life stressors such as family, feeling abandoned by family, overwhelming  staff at the facility, multiple medical issues, etc.   11/05/19:  Melanie Young is a 79 y.o. female patient reports today that she still feels very depressed.  She states that isn't that she did not like the facility.  She reports that the facility is a very nice place and the staff there worked extremely hard to help all of the patients that are there.  She states that she just feels that she is a burden on them because she has started having more issues.  She states that she feels that she has a burden on everyone else and that there is no reason to live anymore.  She states that she is not even sure that another facility would better assist her or make her feel any better.  She reports that she feels that maybe been admitted to inpatient psychiatric unit could possibly improve her mood with the right medications and therapy.  She states that she does have people to talk to at the facility and she is on medications for her depression and anxiety but it does not seem to be getting any better.  She continues to report multiple life stressors such as family, feeling abandoned by family, overwhelming staff at the facility, multiple medical issues, etc.  HPI per MD on admission to the ED on 11/03/19:  Patient was seen in the emergency department for chest pain and aggressive behavior in the group home.  Initially patient was calm and cooperative and expressed her dismay for her current living situation.  However upon finding that she would be sent back to her nursing home, patient became extremely aggressive and threw food at staff as well as put a bag over her head.  Patient medications were attempted to be adjusted however she was unable to to be calmed down.  IM droperidol had to be administered.  Past Psychiatric History: depression, dementia, anxiety  Risk to Self:  none Risk to Others:  none Prior Inpatient Therapy:  none Prior Outpatient Therapy:  yes  Past Medical History:  Past Medical History:   Diagnosis Date  . Breast cancer ([place]) 1980's   right breast ca with mastectomy  . Chronic kidney disease   . Dementia ([place])   . Depression   . Diabetes mellitus without complication ([place])   . Hypercholesteremia   . Hypertension   . Hypothyroidism   . Osteoporosis     Past Surgical History:  Procedure Laterality Date  . BREAST BIOPSY Left 2013   core needle bx, benign  . BREAST SURGERY    . CESAREAN SECTION    . COLONOSCOPY WITH PROPOFOL N/A 08/31/2018   Procedure: COLONOSCOPY WITH PROPOFOL;  Surgeon: Toledo, Benay Pike, MD;  Location: [facility] ENDOSCOPY;  Service: Endoscopy;  Laterality: N/A;  . MASTECTOMY Right 1973   breast ca  . THYROIDECTOMY     Family History:  Family History  Problem Relation Age of Onset  . Breast cancer Maternal Aunt   . Breast cancer  Cousin        3 maternal cousins, 22's   Family Psychiatric  History: none Social History:  Social History   Substance and Sexual Activity  Alcohol Use Not Currently     Social History   Substance and Sexual Activity  Drug Use Never    Social History   Socioeconomic History  . Marital status: Single    Spouse name: Not on file  . Number of children: Not on file  . Years of education: Not on file  . Highest education level: Not on file  Occupational History  . Not on file  Tobacco Use  . Smoking status: Never Smoker  . Smokeless tobacco: Never Used  Substance and Sexual Activity  . Alcohol use: Not Currently  . Drug use: Never  . Sexual activity: Not on file  Other Topics Concern  . Not on file  Social History Narrative  . Not on file   Social Determinants of Health   Financial Resource Strain:   . Difficulty of Paying Living Expenses:   Food Insecurity:   . Worried About Charity fundraiser in the Last Year:   . Arboriculturist in the Last Year:   Transportation Needs:   . Film/video editor (Medical):   Marland Kitchen Lack of Transportation (Non-Medical):   Physical Activity:   . Days of Exercise  per Week:   . Minutes of Exercise per Session:   Stress:   . Feeling of Stress :   Social Connections:   . Frequency of Communication with Friends and Family:   . Frequency of Social Gatherings with Friends and Family:   . Attends Religious Services:   . Active Member of Clubs or Organizations:   . Attends Archivist Meetings:   Marland Kitchen Marital Status:    Additional Social History:    Allergies:  No Known Allergies  Labs:  Results for orders placed or performed during the hospital encounter of 11/03/19 (from the past 48 hour(s))  Glucose, capillary     Status: Abnormal   Collection Time: 11/10/19 12:14 PM  Result Value Ref Range   Glucose-Capillary 100 (H) 70 - 99 mg/dL    Comment: Glucose reference range applies only to samples taken after fasting for at least 8 hours.  Glucose, capillary     Status: Abnormal   Collection Time: 11/10/19  5:14 PM  Result Value Ref Range   Glucose-Capillary 115 (H) 70 - 99 mg/dL    Comment: Glucose reference range applies only to samples taken after fasting for at least 8 hours.  Glucose, capillary     Status: Abnormal   Collection Time: 11/10/19  9:19 PM  Result Value Ref Range   Glucose-Capillary 118 (H) 70 - 99 mg/dL    Comment: Glucose reference range applies only to samples taken after fasting for at least 8 hours.  CBC     Status: Abnormal   Collection Time: 11/11/19  6:41 AM  Result Value Ref Range   WBC 5.4 4.0 - 10.5 K/uL   RBC 4.02 3.87 - 5.11 MIL/uL   Hemoglobin 11.1 (L) 12.0 - 15.0 g/dL   HCT 34.5 (L) 36.0 - 46.0 %   MCV 85.8 80.0 - 100.0 fL   MCH 27.6 26.0 - 34.0 pg   MCHC 32.2 30.0 - 36.0 g/dL   RDW 15.7 (H) 11.5 - 15.5 %   Platelets 208 150 - 400 K/uL   nRBC 0.0 0.0 - 0.2 %  Comment: Performed at [facility], [address] [place], [place] [id number]  Basic metabolic panel     Status: Abnormal   Collection Time: 11/11/19  6:41 AM  Result Value Ref Range   Sodium 141 135 - 145 mmol/L   Potassium 4.4  3.5 - 5.1 mmol/L    Comment: HEMOLYSIS AT THIS LEVEL MAY AFFECT RESULT   Chloride 109 98 - 111 mmol/L   CO2 25 22 - 32 mmol/L   Glucose, Bld 104 (H) 70 - 99 mg/dL    Comment: Glucose reference range applies only to samples taken after fasting for at least 8 hours.   BUN 16 8 - 23 mg/dL   Creatinine, Ser 0.89 0.44 - 1.00 mg/dL   Calcium 8.9 8.9 - 10.3 mg/dL   GFR calc non Af Amer >60 >60 mL/min   GFR calc Af Amer >60 >60 mL/min   Anion gap 7 5 - 15    Comment: Performed at [facility], [address] [place], [place] [address]  Glucose, capillary     Status: Abnormal   Collection Time: 11/11/19  7:28 AM  Result Value Ref Range   Glucose-Capillary 109 (H) 70 - 99 mg/dL    Comment: Glucose reference range applies only to samples taken after fasting for at least 8 hours.  Glucose, capillary     Status: Abnormal   Collection Time: 11/11/19 11:12 AM  Result Value Ref Range   Glucose-Capillary 100 (H) 70 - 99 mg/dL    Comment: Glucose reference range applies only to samples taken after fasting for at least 8 hours.  Glucose, capillary     Status: Abnormal   Collection Time: 11/11/19  4:17 PM  Result Value Ref Range   Glucose-Capillary 105 (H) 70 - 99 mg/dL    Comment: Glucose reference range applies only to samples taken after fasting for at least 8 hours.  Glucose, capillary     Status: Abnormal   Collection Time: 11/11/19 10:05 PM  Result Value Ref Range   Glucose-Capillary 108 (H) 70 - 99 mg/dL    Comment: Glucose reference range applies only to samples taken after fasting for at least 8 hours.  CBC     Status: Abnormal   Collection Time: 11/12/19  5:38 AM  Result Value Ref Range   WBC 4.9 4.0 - 10.5 K/uL   RBC 4.35 3.87 - 5.11 MIL/uL   Hemoglobin 11.9 (L) 12.0 - 15.0 g/dL   HCT 36.3 36.0 - 46.0 %   MCV 83.4 80.0 - 100.0 fL   MCH 27.4 26.0 - 34.0 pg   MCHC 32.8 30.0 - 36.0 g/dL   RDW 15.4 11.5 - 15.5 %   Platelets 221 150 - 400 K/uL   nRBC 0.0 0.0 - 0.2 %     Comment: Performed at [facility], [address], [place],  [id number]  Basic metabolic panel     Status: Abnormal   Collection Time: 11/12/19  5:38 AM  Result Value Ref Range   Sodium 139 135 - 145 mmol/L   Potassium 3.9 3.5 - 5.1 mmol/L   Chloride 106 98 - 111 mmol/L   CO2 24 22 - 32 mmol/L   Glucose, Bld 98 70 - 99 mg/dL    Comment: Glucose reference range applies only to samples taken after fasting for at least 8 hours.   BUN 17 8 - 23 mg/dL   Creatinine, Ser 0.93 0.44 - 1.00 mg/dL   Calcium 9.2 8.9 -  10.3 mg/dL   GFR calc non Af Amer 60 (L) >60 mL/min   GFR calc Af Amer >60 >60 mL/min   Anion gap 9 5 - 15    Comment: Performed at [facility], [address] [place], [place] [address]  Glucose, capillary     Status: Abnormal   Collection Time: 11/12/19  8:22 AM  Result Value Ref Range   Glucose-Capillary 102 (H) 70 - 99 mg/dL    Comment: Glucose reference range applies only to samples taken after fasting for at least 8 hours.   Comment 1 Notify RN     Current Facility-Administered Medications  Medication Dose Route Frequency Provider Last Rate Last Admin  . acetaminophen (TYLENOL) tablet 650 mg  650 mg Oral Q6H PRN Athena Masse, MD   650 mg at 11/12/19 0043   Or  . acetaminophen (TYLENOL) suppository 650 mg  650 mg Rectal Q6H PRN Athena Masse, MD      . amLODipine (NORVASC) tablet 5 mg  5 mg Oral Daily Nolberto Hanlon, MD   5 mg at 11/12/19 0945  . aspirin EC tablet 81 mg  81 mg Oral Daily Athena Masse, MD   81 mg at 11/12/19 0945  . atorvastatin (LIPITOR) tablet 40 mg  40 mg Oral QHS Athena Masse, MD   40 mg at 11/11/19 2214  . busPIRone (BUSPAR) tablet 10 mg  10 mg Oral BID Athena Masse, MD   10 mg at 11/12/19 0945  . diclofenac Sodium (VOLTAREN) 1 % topical gel 2 g  2 g Topical BID PRN Athena Masse, MD   2 g at 11/11/19 2215  . enoxaparin (LOVENOX) injection 40 mg  40 mg Subcutaneous Q24H Rowland Lathe, RPH   40 mg at 11/12/19  [id number]  . hydrALAZINE (APRESOLINE) tablet 25 mg  25 mg Oral Q6H PRN Wyvonnia Dusky, MD   25 mg at 11/10/19 2027  . insulin aspart (novoLOG) injection 0-20 Units  0-20 Units Subcutaneous TID WC Athena Masse, MD   3 Units at 11/09/19 1218  . insulin aspart (novoLOG) injection 0-5 Units  0-5 Units Subcutaneous QHS Judd Gaudier V, MD      . lamoTRIgine (LAMICTAL) tablet 200 mg  200 mg Oral Daily Athena Masse, MD   200 mg at 11/12/19 0945  . levothyroxine (SYNTHROID) tablet 200 mcg  200 mcg Oral Q0600 Athena Masse, MD   200 mcg at 11/12/19 0604  . ondansetron (ZOFRAN) tablet 4 mg  4 mg Oral Q6H PRN Athena Masse, MD       Or  . ondansetron [facility]) injection 4 mg  4 mg Intravenous Q6H PRN Athena Masse, MD      . pantoprazole (PROTONIX) EC tablet 40 mg  40 mg Oral Daily Athena Masse, MD   40 mg at 11/12/19 0945  . QUEtiapine (SEROQUEL) tablet 25 mg  25 mg Oral BID Athena Masse, MD   25 mg at 11/12/19 0945  . senna-docusate (Senokot-S) tablet 1 tablet  1 tablet Oral QHS PRN Athena Masse, MD      . sertraline (ZOLOFT) tablet 25 mg  25 mg Oral Daily Athena Masse, MD   25 mg at 11/12/19 0945    Musculoskeletal: Strength & Muscle Tone: decreased Gait & Station: did not witness Patient leans: N/A  Psychiatric Specialty Exam: Physical Exam  Nursing note and vitals reviewed. Constitutional: She is oriented to person, place, and  time. She appears well-developed and well-nourished.  HENT:  Head: Normocephalic.  Respiratory: Effort normal.  Musculoskeletal:     Cervical back: Normal range of motion.  Neurological: She is alert and oriented to person, place, and time.  Psychiatric: Her speech is normal and behavior is normal. Judgment and thought content normal. Her mood appears anxious. Her affect is blunt. Cognition and memory are impaired. She exhibits a depressed mood.    Review of Systems  Musculoskeletal: Positive for arthralgias.  Psychiatric/Behavioral: Positive  for dysphoric mood. The patient is nervous/anxious.   All other systems reviewed and are negative.   Blood pressure (!) 155/79, pulse 60, temperature 98.6 F (37 C), temperature source Oral, resp. rate 18, height 5\' 1"  (1.549 m), weight 107.4 kg, SpO2 93 %.Body mass index is 44.74 kg/m.  General Appearance: Casual  Eye Contact:  Good  Speech:  Normal Rate  Volume:  Normal  Mood:  Anxious and Depressed  Affect:  Blunt  Thought Process:  Coherent and Descriptions of Associations: Intact  Orientation:  Full (Time, Place, and Person)  Thought Content:  WDL and Logical  Suicidal Thoughts:  No  Homicidal Thoughts:  No  Memory:  Immediate;   Fair Recent;   Fair Remote;   Fair  Judgement:  Fair  Insight:  Fair  Psychomotor Activity:  Decreased  Concentration:  Concentration: Fair and Attention Span: Fair  Recall:  [employer] of Knowledge:  Fair  Language:  Good  Akathisia:  No  Handed:  Right  AIMS (if indicated):     Assets:  Housing Leisure Time Resilience Social Support  ADL's:  Impaired  Cognition:  Impaired,  Mild  Sleep:       This is a 79 year old woman with a history of dementia who is presented with behavioral disturbance in her nursing home.    Her medications were adjusted and her aggression has dissipated.  She also expressed suicidal ideations earlier in her admission but these have resolved with medication changes for mood, agitation, and hypothyroidism.  No homicidal ideations or hallucinations.  She does not want to return to her memory care facility.  Psychiatrically cleared at this time with Dr. Rella Larve approval, psychiatrist.  Treatment Plan Summary: Major depressive disorder, recurrent, moderate: Continue Lamictal 200 mg p.o. twice daily Continue Zoloft 25 mg daily  General anxiety disorder: Continue BuSpar 10 mg p.o. twice daily Continue Zoloft 25 mg PO Daily  Hypothyroidism: Continue Synthroid to 200 mcg p.o. daily for hypothyroidism  Dementia with  behavioral changes: Continue Seroquel 25 mg p.o. twice daily  Disposition: No evidence of imminent risk to self or others at present.   Patient does not meet criteria for psychiatric inpatient admission.  Waylan Boga, NP 11/12/2019 10:56 AM

## 2019-11-13 LAB — GLUCOSE, CAPILLARY
Glucose-Capillary: 108 mg/dL — ABNORMAL HIGH (ref 70–99)
Glucose-Capillary: 137 mg/dL — ABNORMAL HIGH (ref 70–99)
Glucose-Capillary: 95 mg/dL (ref 70–99)
Glucose-Capillary: 116 mg/dL — ABNORMAL HIGH (ref 70–99)

## 2019-11-13 LAB — CBC
HCT: 37.6 % (ref 36.0–46.0)
Hemoglobin: 12.2 g/dL (ref 12.0–15.0)
MCH: 27.5 pg (ref 26.0–34.0)
MCHC: 32.4 g/dL (ref 30.0–36.0)
MCV: 84.7 fL (ref 80.0–100.0)
Platelets: 255 10*3/uL (ref 150–400)
RBC: 4.44 MIL/uL (ref 3.87–5.11)
RDW: 15.6 % — ABNORMAL HIGH (ref 11.5–15.5)
WBC: 6.2 10*3/uL (ref 4.0–10.5)
nRBC: 0 % (ref 0.0–0.2)

## 2019-11-13 LAB — BASIC METABOLIC PANEL
Anion gap: 10 (ref 5–15)
BUN: 18 mg/dL (ref 8–23)
CO2: 23 mmol/L (ref 22–32)
Calcium: 9.4 mg/dL (ref 8.9–10.3)
Chloride: 106 mmol/L (ref 98–111)
Creatinine, Ser: 0.95 mg/dL (ref 0.44–1.00)
GFR calc Af Amer: >60 mL/min (ref >60)
GFR calc non Af Amer: 58 mL/min — ABNORMAL LOW (ref >60)
Glucose, Bld: 112 mg/dL — ABNORMAL HIGH (ref 70–99)
Potassium: 3.8 mmol/L (ref 3.5–5.1)
Sodium: 139 mmol/L (ref 135–145)

## 2019-11-13 MED ORDER — LORAZEPAM 2 MG/ML IJ SOLN
0.5000 mg | Freq: Once | INTRAMUSCULAR | Status: DC
  Filled 2019-11-13: qty 1

## 2019-11-13 MED ORDER — LORAZEPAM 0.5 MG PO TABS
0.5000 mg | ORAL_TABLET | Freq: Once | ORAL | Status: AC
  Administered 2019-11-13: 0.5 mg via ORAL
  Filled 2019-11-13: qty 1

## 2019-11-13 NOTE — Progress Notes (Signed)
PROGRESS NOTE    Melanie Young  [id number] DOB: Jan 11, 1941 DOA: 11/03/2019 PCP: System, Pcp Not In     Assessment & Plan:   Principal Problem:   Hypotension Active Problems:   Acute kidney injury ([place])   Sinus bradycardia   Dementia with behavioral disturbance ([place])   Type 2 diabetes mellitus with hyperlipidemia ([place])   History of breast cancer   Essential hypertension   Post-surgical hypothyroidism   CKD (chronic kidney disease) stage 3, GFR 30-59 ml/min   AKI (acute kidney injury) ([place])   Major depressive disorder, recurrent episode, moderate (HCC)  Major depressive disorder: recurrent episode, severe. Continue buspar, lamictal and seroquel as started by psych service in the emergency room. No longer meets inpatient psych criteria as per psych.   Dementia with behavioral disturbance: very agitated today and screaming at staff today. Ativan x1. No longer inpatient psych criteria as per psych   AKI on CKDIIIa: Cr is better than baseline. Continue to hold metformin, torsemide, linagliptin, losartan, lyrica and oxybutynin.   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 Avoid avn blockers or other potential meds to exacerbate it. Pt evaluated for presyncope related to symptomatic bradycardia back in December. Had an unremarkable echocardiogram. Pacemaker was not felt necessary at the time. Should follow up with Dr. [name] as an outpatient 1 week after discharge for possible stress test and further evaluation regarding the indications for possible pacemaker placement in the near-future.   DM2: pretty well controlled w/ HbA1c 6.2. Continue to hold home metformin and linagliptin due to renal function. Continue on SSI w/ accuchecks   History of breast cancer: no acute concerns at this time  Post-surgical hypothyroidism: continue levothyroxine  Hypertension: continue on amlodipine. Hydralazine prn   Hypotension: resolved  DVT prophylaxis:  lovenox Code Status: full  Family Communication:  Disposition Plan: medically stable for transfer to inpatient psych but psych now saying that pt does not meet criteria for inpatient psych facility. PT recs SNF. CM is working on placement to SNF   Consultants:   Psych  cardio   Procedures:    Antimicrobials:    Subjective: Pt c/o irritated by pt's coughing in another room. Pt was screaming and slamming her hands on her bedside table   Objective: Vitals:   11/12/19 2003 11/13/19 0437 11/13/19 0500 11/13/19 0742  BP: (!) 163/96 (!) 152/62  (!) 160/78  Pulse: (!) 56 83  72  Resp: 20 16  17   Temp: 98.7 F (37.1 C) 98.8 F (37.1 C)  97.8 F (36.6 C)  TempSrc: Oral Oral    SpO2: 99% 95%  98%  Weight:   103.6 kg   Height:        Intake/Output Summary (Last 24 hours) at 11/13/2019 0836 Last data filed at 11/13/2019 0742 Gross per 24 hour  Intake 480 ml  Output 0 ml  Net 480 ml   Filed Weights   11/09/19 0455 11/11/19 0524 11/13/19 0500  Weight: 108.2 kg 107.4 kg 103.6 kg    Examination:  General exam: Appears agitated  Respiratory system: Clear to auscultation. No rales. B/L LE edema Cardiovascular system: S1 & S2 +. No rubs, gallops or clicks.  Gastrointestinal system: Abdomen is obese, soft and nontender. Hypoactive bowel sounds heard. Central nervous system: Alert and oriented. Moves all 4 extremities  Psychiatry: pt appears agitated and frustrated     Data Reviewed: I have personally reviewed following labs and imaging studies  CBC: Recent Labs  Lab 11/07/19  [id number] 11/10/19 0526 11/11/19 0641 11/12/19 0538 11/13/19 0719  WBC 5.2 5.2 5.4 4.9 6.2  HGB 10.8* 11.6* 11.1* 11.9* 12.2  HCT 33.5* 35.1* 34.5* 36.3 37.6  MCV 85.5 85.0 85.8 83.4 84.7  PLT 179 190 208 221 [id number]   Basic Metabolic Panel: Recent Labs  Lab 11/09/19 0712 11/10/19 0526 11/11/19 0641 11/12/19 0538 11/13/19 0719  NA 139 139 141 139 139  K 4.0 4.0 4.4 3.9 3.8  CL 108 107 109 106  106  CO2 23 25 25 24 23   GLUCOSE 106* 97 104* 98 112*  BUN 16 13 16 17 18   CREATININE 0.97 0.81 0.89 0.93 0.95  CALCIUM 8.7* 9.1 8.9 9.2 9.4   GFR: Estimated Creatinine Clearance: 55.8 mL/min (by C-G formula based on SCr of 0.95 mg/dL). Liver Function Tests: No results for input(s): AST, ALT, ALKPHOS, BILITOT, PROT, ALBUMIN in the last 168 hours. No results for input(s): LIPASE, AMYLASE in the last 168 hours. No results for input(s): AMMONIA in the last 168 hours. Coagulation Profile: No results for input(s): INR, PROTIME in the last 168 hours. Cardiac Enzymes: No results for input(s): CKTOTAL, CKMB, CKMBINDEX, TROPONINI in the last 168 hours. BNP (last 3 results) No results for input(s): PROBNP in the last 8760 hours. HbA1C: No results for input(s): HGBA1C in the last 72 hours. CBG: Recent Labs  Lab 11/12/19 0822 11/12/19 1158 11/12/19 1653 11/12/19 2044 11/13/19 0742  GLUCAP 102* 130* 115* 124* 108*   Lipid Profile: No results for input(s): CHOL, HDL, LDLCALC, TRIG, CHOLHDL, LDLDIRECT in the last 72 hours. Thyroid Function Tests: No results for input(s): TSH, T4TOTAL, FREET4, T3FREE, THYROIDAB in the last 72 hours. Anemia Panel: No results for input(s): VITAMINB12, FOLATE, FERRITIN, TIBC, IRON, RETICCTPCT in the last 72 hours. Sepsis Labs: No results for input(s): PROCALCITON, LATICACIDVEN in the last 168 hours.  Recent Results (from the past 240 hour(s))  Respiratory Panel by RT PCR (Flu A&B, Covid) - Nasopharyngeal Swab     Status: None   Collection Time: 11/03/19  3:24 PM   Specimen: Nasopharyngeal Swab  Result Value Ref Range Status   SARS Coronavirus 2 by RT PCR NEGATIVE NEGATIVE Final    Comment: (NOTE) SARS-CoV-2 target nucleic acids are NOT DETECTED. The SARS-CoV-2 RNA is generally detectable in upper respiratoy specimens during the acute phase of infection. The lowest concentration of SARS-CoV-2 viral copies this assay can detect is 131 copies/mL. A  negative result does not preclude SARS-Cov-2 infection and should not be used as the sole basis for treatment or other patient management decisions. A negative result may occur with  improper specimen collection/handling, submission of specimen other than nasopharyngeal swab, presence of viral mutation(s) within the areas targeted by this assay, and inadequate number of viral copies (<131 copies/mL). A negative result must be combined with clinical observations, patient history, and epidemiological information. The expected result is Negative. Fact Sheet for Patients:  PinkCheek.be Fact Sheet for Healthcare Providers:  GravelBags.it This test is not yet ap proved or cleared by the [place] FDA and  has been authorized for detection and/or diagnosis of SARS-CoV-2 by FDA under an Emergency Use Authorization (EUA). This EUA will remain  in effect (meaning this test can be used) for the duration of the COVID-19 declaration under Section 564(b)(1) of the Act, 21 U.S.C. section 360bbb-3(b)(1), unless the authorization is terminated or revoked sooner.    Influenza A by PCR NEGATIVE NEGATIVE Final   Influenza B by PCR NEGATIVE NEGATIVE Final  Comment: (NOTE) The Xpert Xpress SARS-CoV-2/FLU/RSV assay is intended as an aid in  the diagnosis of influenza from Nasopharyngeal swab specimens and  should not be used as a sole basis for treatment. Nasal washings and  aspirates are unacceptable for Xpert Xpress SARS-CoV-2/FLU/RSV  testing. Fact Sheet for Patients: PinkCheek.be Fact Sheet for Healthcare Providers: GravelBags.it This test is not yet approved or cleared by the [place] FDA and  has been authorized for detection and/or diagnosis of SARS-CoV-2 by  FDA under an Emergency Use Authorization (EUA). This EUA will remain  in effect (meaning this test can be used) for  the duration of the  Covid-19 declaration under Section 564(b)(1) of the Act, 21  U.S.C. section 360bbb-3(b)(1), unless the authorization is  terminated or revoked. Performed at [facility], [address] [place], [place] [address]   MRSA PCR Screening     Status: None   Collection Time: 11/07/19 12:47 AM   Specimen: Nasopharyngeal  Result Value Ref Range Status   MRSA by PCR NEGATIVE NEGATIVE Final    Comment:        The GeneXpert MRSA Assay (FDA approved for NASAL specimens only), is one component of a comprehensive MRSA colonization surveillance program. It is not intended to diagnose MRSA infection nor to guide or monitor treatment for MRSA infections. Performed at [facility], [address] [place], [place] [address]          Radiology Studies: No results found.      Scheduled Meds: . amLODipine  5 mg Oral Daily  . aspirin EC  81 mg Oral Daily  . atorvastatin  40 mg Oral QHS  . busPIRone  10 mg Oral BID  . enoxaparin (LOVENOX) injection  40 mg Subcutaneous Q24H  . insulin aspart  0-20 Units Subcutaneous TID WC  . insulin aspart  0-5 Units Subcutaneous QHS  . lamoTRIgine  200 mg Oral Daily  . levothyroxine  200 mcg Oral Q0600  . pantoprazole  40 mg Oral Daily  . QUEtiapine  25 mg Oral BID  . sertraline  25 mg Oral Daily   Continuous Infusions:   LOS: 5 days    Time spent: 33 mins    Wyvonnia Dusky, MD [facility] Pager [phone]  If 7PM-7AM, please contact night-coverage [email or web address] 11/13/2019, 8:36 AM

## 2019-11-14 LAB — CBC
HCT: 37.4 % (ref 36.0–46.0)
Hemoglobin: 12.4 g/dL (ref 12.0–15.0)
MCH: 28.1 pg (ref 26.0–34.0)
MCHC: 33.2 g/dL (ref 30.0–36.0)
MCV: 84.8 fL (ref 80.0–100.0)
Platelets: 252 10*3/uL (ref 150–400)
RBC: 4.41 MIL/uL (ref 3.87–5.11)
RDW: 15.6 % — ABNORMAL HIGH (ref 11.5–15.5)
WBC: 4.7 10*3/uL (ref 4.0–10.5)
nRBC: 0 % (ref 0.0–0.2)

## 2019-11-14 LAB — GLUCOSE, CAPILLARY
Glucose-Capillary: 110 mg/dL — ABNORMAL HIGH (ref 70–99)
Glucose-Capillary: 130 mg/dL — ABNORMAL HIGH (ref 70–99)
Glucose-Capillary: 91 mg/dL (ref 70–99)
Glucose-Capillary: 144 mg/dL — ABNORMAL HIGH (ref 70–99)

## 2019-11-14 LAB — BASIC METABOLIC PANEL
Anion gap: 6 (ref 5–15)
BUN: 20 mg/dL (ref 8–23)
CO2: 25 mmol/L (ref 22–32)
Calcium: 9.7 mg/dL (ref 8.9–10.3)
Chloride: 109 mmol/L (ref 98–111)
Creatinine, Ser: 0.88 mg/dL (ref 0.44–1.00)
GFR calc Af Amer: >60 mL/min (ref >60)
GFR calc non Af Amer: >60 mL/min (ref >60)
Glucose, Bld: 134 mg/dL — ABNORMAL HIGH (ref 70–99)
Potassium: 3.8 mmol/L (ref 3.5–5.1)
Sodium: 140 mmol/L (ref 135–145)

## 2019-11-14 MED ORDER — LORAZEPAM 0.5 MG PO TABS
0.5000 mg | ORAL_TABLET | Freq: Once | ORAL | Status: AC
  Administered 2019-11-14: 0.5 mg via ORAL
  Filled 2019-11-14: qty 1

## 2019-11-14 NOTE — Progress Notes (Signed)
Report received from [place], [place] and care assumed.  Pt sitting on side of bed fully dressed and insists that she is leaving today.  Pt instructed that the doctor has not discharged her yet and that she is still being treated.  Pt still says she leaving and that her ride is on the way.  Bed alarm on and room is close to nursing desk.  No tele and no IV. Will cont to monitor closely.
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
## 2019-11-14 NOTE — Progress Notes (Signed)
Not new

## 2019-11-14 NOTE — TOC Progression Note (Signed)
Transition of Care [facility]) - Progression Note    Patient Details  Name: Melanie Young MRN: [id number] Date of Birth: 1941/08/06  [facility] CM/SW Contact  Eileen Stanford, LCSW Phone Number: 11/14/2019, 11:47 AM  Clinical Narrative:  Pt has been cleared from psych and is seeking SNF placement. Pt does not want to return to [facility]. Pt's PASRR is pending. Pt currently has no bed offers however CSW sent referral to several more facilities today. Anticipate difficulty finding a bed due to psych hx.          Expected Discharge Plan and Services                                                 Social Determinants of Health (SDOH) Interventions    Readmission Risk Interventions No flowsheet data found.
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
## 2019-11-14 NOTE — Progress Notes (Signed)
Physical Therapy Treatment Patient Details Name: Melanie Young MRN: [id number] DOB: 06/20/1941 Today's Date: 11/14/2019    History of Present Illness Patient is a 79 y/o F that returned to ED from facility due to AMS and bradycardia. She has been quite emotional, noted to have been tearful throughout much of the week she has been in ED. Per RN, she has been having difficulty with transfers and ambulation.    PT Comments    Agreeable to standing and gait trials this date, requiring min assist +2 for safety at all times.  Very slow and deliberate with all movement patterns, limited by pain in bilat knees. Gait pattern significant for very short, shuffling steps with poor step height/length; maintains bilat knee flex throughout gait cycle with significant forward trunk flexion/lean (tends to prop forearms on RW during gait distance).  Anticipate need for manual WC as primary mobility immediately upon discharge until knee pain resolved/tolerable for patient.    Follow Up Recommendations  SNF     Equipment Recommendations  Rolling walker with 5" wheels    Recommendations for Other Services       Precautions / Restrictions Precautions Precautions: Fall Precaution Comments: audible crepitus bilat knees Restrictions Weight Bearing Restrictions: No    Mobility  Bed Mobility               General bed mobility comments: seated edge of bed beginning of session, in recliner end of session  Transfers Overall transfer level: Needs assistance Equipment used: Rolling walker (2 wheeled) Transfers: Sit to/from Stand Sit to Stand: Min assist;+2 physical assistance;From elevated surface         General transfer comment: broad BOS, heavy use of bilat UEs to complete  Ambulation/Gait Ambulation/Gait assistance: Min assist;+2 physical assistance Gait Distance (Feet): (15' x2) Assistive device: Rolling walker (2 wheeled)       General Gait Details: very short, shuffling steps with  poor step height/length; maintains bilat knee flex throughout gait cycle with significant forward trunk flexion/lean (tends to prop forearms on RW during gait distance).  Very slow and effortful; limited by pain in bilat knees   Stairs             Wheelchair Mobility    Modified Rankin (Stroke Patients Only)       Balance Overall balance assessment: Needs assistance Sitting-balance support: No upper extremity supported;Feet supported Sitting balance-Leahy Scale: Good     Standing balance support: Bilateral upper extremity supported Standing balance-Leahy Scale: Poor                              Cognition Arousal/Alertness: Awake/alert Behavior During Therapy: WFL for tasks assessed/performed Overall Cognitive Status: No family/caregiver present to determine baseline cognitive functioning                                        Exercises Other Exercises Other Exercises: Sit/stand with RW from various surface heights (elevated bed x3, recliner x1), min assist +2 for safety.  Very slow and deliberate; heavy use of bilat UEs    General Comments        Pertinent Vitals/Pain Pain Assessment: Faces Faces Pain Scale: Hurts even more Pain Location: BLE knee pain Pain Descriptors / Indicators: Aching;Grimacing;Moaning Pain Intervention(s): Limited activity within patient's tolerance;Repositioned;Monitored during session    Home Living  Prior Function            PT Goals (current goals can now be found in the care plan section) Acute Rehab PT Goals Patient Stated Goal: " I still can't stand but I wish i could" PT Goal Formulation: Patient unable to participate in goal setting Time For Goal Achievement: 11/20/19 Potential to Achieve Goals: Fair Progress towards PT goals: Progressing toward goals    Frequency    Min 2X/week      PT Plan Current plan remains appropriate    Co-evaluation               AM-PAC PT "6 Clicks" Mobility   Outcome Measure  Help needed turning from your back to your side while in a flat bed without using bedrails?: A Little Help needed moving from lying on your back to sitting on the side of a flat bed without using bedrails?: A Little Help needed moving to and from a bed to a chair (including a wheelchair)?: A Little Help needed standing up from a chair using your arms (e.g., wheelchair or bedside chair)?: A Little Help needed to walk in hospital room?: A Lot Help needed climbing 3-5 steps with a railing? : Total 6 Click Score: 15    End of Session Equipment Utilized During Treatment: Gait belt Activity Tolerance: Patient limited by pain;Patient tolerated treatment well Patient left: in chair;with call bell/phone within reach;with chair alarm set Nurse Communication: Mobility status PT Visit Diagnosis: Difficulty in walking, not elsewhere classified (R26.2);History of falling (Z91.81);Pain Pain - Right/Left: Right Pain - part of body: Knee     Time: [id number] PT Time Calculation (min) (ACUTE ONLY): 18 min  Charges:  $Gait Training: 8-22 mins                     Luda Charbonneau H. Owens Shark, PT, DPT, NCS 11/14/19, 10:07 AM [phone]

## 2019-11-14 NOTE — Progress Notes (Signed)
PROGRESS NOTE    Melanie Young  [id number] DOB: May 19, 1941 DOA: 11/03/2019 PCP: System, Pcp Not In     Assessment & Plan:   Principal Problem:   Hypotension Active Problems:   Acute kidney injury ([place])   Sinus bradycardia   Dementia with behavioral disturbance ([place])   Type 2 diabetes mellitus with hyperlipidemia ([place])   History of breast cancer   Essential hypertension   Post-surgical hypothyroidism   CKD (chronic kidney disease) stage 3, GFR 30-59 ml/min   AKI (acute kidney injury) ([place])   Major depressive disorder, recurrent episode, moderate (HCC)  Major depressive disorder: recurrent episode, severe. Continue buspar, lamictal and seroquel as started by psych service in the emergency room. No longer meets inpatient psych criteria as per psych.   Dementia with behavioral disturbance:  less agitated today. No longer meets inpatient psych criteria as per psych   CKDIIIa: Cr is better than baseline. Continue to hold metformin, linagliptin, losartan, lyrica and oxybutynin.   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 Avoid avn blockers or other potential meds to exacerbate it. Pt evaluated for presyncope related to symptomatic bradycardia back in December. Had an unremarkable echocardiogram. Pacemaker was not felt necessary at the time. Should follow up with Dr. [name] as an outpatient 1 week after discharge for possible stress test and further evaluation regarding the indications for possible pacemaker placement in the near-future.   DM2: pretty well controlled w/ HbA1c 6.2. Continue to hold home metformin and linagliptin due to renal function. Continue on SSI w/ accuchecks   History of breast cancer: no acute concerns at this time  Post-surgical hypothyroidism: continue levothyroxine  Hypertension: continue on amlodipine. Hydralazine prn   Hypotension: resolved  DVT prophylaxis: lovenox Code Status: full  Family Communication:   Disposition Plan: medically stable for transfer to inpatient psych but psych now saying that pt does not meet criteria for inpatient psych facility. PT recs SNF. CM is working on placement to SNF still   Consultants:   Psych  cardio   Procedures:    Antimicrobials:    Subjective: Pt is ready to be d/c from the hospital.   Objective: Vitals:   11/13/19 1128 11/13/19 1701 11/13/19 2007 11/14/19 0343  BP: 114/65 (!) 152/87 (!) 162/88 (!) 156/60  Pulse: 61 63 63 (!) 50  Resp: 16 17 20  (!) 22  Temp: 97.7 F (36.5 C) 97.6 F (36.4 C) 98 F (36.7 C) 97.7 F (36.5 C)  TempSrc:  Oral Oral Oral  SpO2: 96%  100% 95%  Weight:    103.2 kg  Height:        Intake/Output Summary (Last 24 hours) at 11/14/2019 0754 Last data filed at 11/13/2019 2200 Gross per 24 hour  Intake 240 ml  Output 0 ml  Net 240 ml   Filed Weights   11/11/19 0524 11/13/19 0500 11/14/19 0343  Weight: 107.4 kg 103.6 kg 103.2 kg    Examination:  General exam: Appears calm and comfortable Respiratory system: Clear to auscultation. No wheezes, rhonchi. B/L LE edema Cardiovascular system: S1 & S2 +. No rubs, gallops or clicks.  Gastrointestinal system: Abdomen is obese, soft and nontender. Normal bowel sounds heard. Central nervous system: Alert and oriented. Moves all 4 extremities  Psychiatry: Flat mood and affect     Data Reviewed: I have personally reviewed following labs and imaging studies  CBC: Recent Labs  Lab 11/10/19 0526 11/11/19 0641 11/12/19 0538 11/13/19 0719 11/14/19 0629  WBC 5.2 5.4  4.9 6.2 4.7  HGB 11.6* 11.1* 11.9* 12.2 12.4  HCT 35.1* 34.5* 36.3 37.6 37.4  MCV 85.0 85.8 83.4 84.7 84.8  PLT 190 208 221 255 [id number]   Basic Metabolic Panel: Recent Labs  Lab 11/10/19 0526 11/11/19 0641 11/12/19 0538 11/13/19 0719 11/14/19 0629  NA 139 141 139 139 140  K 4.0 4.4 3.9 3.8 3.8  CL 107 109 106 106 109  CO2 25 25 24 23 25   GLUCOSE 97 104* 98 112* 134*  BUN 13 16 17 18 20     CREATININE 0.81 0.89 0.93 0.95 0.88  CALCIUM 9.1 8.9 9.2 9.4 9.7   GFR: Estimated Creatinine Clearance: 60.1 mL/min (by C-G formula based on SCr of 0.88 mg/dL). Liver Function Tests: No results for input(s): AST, ALT, ALKPHOS, BILITOT, PROT, ALBUMIN in the last 168 hours. No results for input(s): LIPASE, AMYLASE in the last 168 hours. No results for input(s): AMMONIA in the last 168 hours. Coagulation Profile: No results for input(s): INR, PROTIME in the last 168 hours. Cardiac Enzymes: No results for input(s): CKTOTAL, CKMB, CKMBINDEX, TROPONINI in the last 168 hours. BNP (last 3 results) No results for input(s): PROBNP in the last 8760 hours. HbA1C: No results for input(s): HGBA1C in the last 72 hours. CBG: Recent Labs  Lab 11/12/19 2044 11/13/19 0742 11/13/19 1123 11/13/19 1659 11/13/19 2135  GLUCAP 124* 108* 137* 95 116*   Lipid Profile: No results for input(s): CHOL, HDL, LDLCALC, TRIG, CHOLHDL, LDLDIRECT in the last 72 hours. Thyroid Function Tests: No results for input(s): TSH, T4TOTAL, FREET4, T3FREE, THYROIDAB in the last 72 hours. Anemia Panel: No results for input(s): VITAMINB12, FOLATE, FERRITIN, TIBC, IRON, RETICCTPCT in the last 72 hours. Sepsis Labs: No results for input(s): PROCALCITON, LATICACIDVEN in the last 168 hours.  Recent Results (from the past 240 hour(s))  MRSA PCR Screening     Status: None   Collection Time: 11/07/19 12:47 AM   Specimen: Nasopharyngeal  Result Value Ref Range Status   MRSA by PCR NEGATIVE NEGATIVE Final    Comment:        The GeneXpert MRSA Assay (FDA approved for NASAL specimens only), is one component of a comprehensive MRSA colonization surveillance program. It is not intended to diagnose MRSA infection nor to guide or monitor treatment for MRSA infections. Performed at [facility], [address], [place], [place] [address]          Radiology Studies: No results found.      Scheduled  Meds: . amLODipine  5 mg Oral Daily  . aspirin EC  81 mg Oral Daily  . atorvastatin  40 mg Oral QHS  . busPIRone  10 mg Oral BID  . enoxaparin (LOVENOX) injection  40 mg Subcutaneous Q24H  . insulin aspart  0-20 Units Subcutaneous TID WC  . insulin aspart  0-5 Units Subcutaneous QHS  . lamoTRIgine  200 mg Oral Daily  . levothyroxine  200 mcg Oral Q0600  . pantoprazole  40 mg Oral Daily  . QUEtiapine  25 mg Oral BID  . sertraline  25 mg Oral Daily   Continuous Infusions:   LOS: 6 days    Time spent: 30 mins    Wyvonnia Dusky, MD [facility] Pager [phone]  If 7PM-7AM, please contact night-coverage [email or web address] 11/14/2019, 7:54 AM

## 2019-11-15 LAB — GLUCOSE, CAPILLARY
Glucose-Capillary: 115 mg/dL — ABNORMAL HIGH (ref 70–99)
Glucose-Capillary: 142 mg/dL — ABNORMAL HIGH (ref 70–99)
Glucose-Capillary: 89 mg/dL (ref 70–99)
Glucose-Capillary: 115 mg/dL — ABNORMAL HIGH (ref 70–99)

## 2019-11-15 LAB — BASIC METABOLIC PANEL
Anion gap: 9 (ref 5–15)
BUN: 26 mg/dL — ABNORMAL HIGH (ref 8–23)
CO2: 24 mmol/L (ref 22–32)
Calcium: 9.6 mg/dL (ref 8.9–10.3)
Chloride: 107 mmol/L (ref 98–111)
Creatinine, Ser: 0.91 mg/dL (ref 0.44–1.00)
GFR calc Af Amer: >60 mL/min (ref >60)
GFR calc non Af Amer: >60 mL/min (ref >60)
Glucose, Bld: 105 mg/dL — ABNORMAL HIGH (ref 70–99)
Potassium: 4 mmol/L (ref 3.5–5.1)
Sodium: 140 mmol/L (ref 135–145)

## 2019-11-15 LAB — CBC
HCT: 37.2 % (ref 36.0–46.0)
Hemoglobin: 12.1 g/dL (ref 12.0–15.0)
MCH: 27.5 pg (ref 26.0–34.0)
MCHC: 32.5 g/dL (ref 30.0–36.0)
MCV: 84.5 fL (ref 80.0–100.0)
Platelets: 257 10*3/uL (ref 150–400)
RBC: 4.4 MIL/uL (ref 3.87–5.11)
RDW: 15.7 % — ABNORMAL HIGH (ref 11.5–15.5)
WBC: 6.1 10*3/uL (ref 4.0–10.5)
nRBC: 0 % (ref 0.0–0.2)

## 2019-11-15 MED ORDER — LOSARTAN POTASSIUM 50 MG PO TABS
50.0000 mg | ORAL_TABLET | Freq: Every day | ORAL | Status: DC
  Administered 2019-11-15 – 2019-11-22 (×8): 50 mg via ORAL
  Filled 2019-11-15 (×8): qty 1

## 2019-11-15 MED ORDER — ENOXAPARIN SODIUM 40 MG/0.4ML ~~LOC~~ SOLN
40.0000 mg | Freq: Two times a day (BID) | SUBCUTANEOUS | Status: DC
  Administered 2019-11-15 – 2019-11-18 (×7): 40 mg via SUBCUTANEOUS
  Filled 2019-11-15 (×7): qty 0.4

## 2019-11-15 NOTE — Progress Notes (Signed)
PHARMACIST - PHYSICIAN COMMUNICATION  CONCERNING:  Enoxaparin (Lovenox) for DVT Prophylaxis    RECOMMENDATION: Patient was prescribed enoxaprin 40mg  q24 hours for VTE prophylaxis.   Filed Weights   11/11/19 0524 11/13/19 0500 11/14/19 0343  Weight: 236 lb 12.4 oz (107.4 kg) 228 lb 8 oz (103.6 kg) 227 lb 8.2 oz (103.2 kg)    Body mass index is 42.99 kg/m.  Estimated Creatinine Clearance: 58.1 mL/min (by C-G formula based on SCr of 0.91 mg/dL).   Based on [place] patient is candidate for enoxaparin 40mg  every 12 hour dosing due to BMI being >40.  DESCRIPTION: Pharmacy has adjusted enoxaparin dose per [facility] policy.  Patient is now receiving enoxaparin 40mg  every 12 hours.   Lu Duffel, PharmD, BCPS Clinical Pharmacist 11/15/2019 10:17 AM

## 2019-11-15 NOTE — Progress Notes (Signed)
PROGRESS NOTE    Melanie Young  [id number] DOB: 20-Apr-1941 DOA: 11/03/2019 PCP: System, Pcp Not In     Assessment & Plan:   Principal Problem:   Hypotension Active Problems:   Acute kidney injury ([place])   Sinus bradycardia   Dementia with behavioral disturbance ([place])   Type 2 diabetes mellitus with hyperlipidemia ([place])   History of breast cancer   Essential hypertension   Post-surgical hypothyroidism   CKD (chronic kidney disease) stage 3, GFR 30-59 ml/min   AKI (acute kidney injury) ([place])   Major depressive disorder, recurrent episode, moderate (HCC)  Major depressive disorder: recurrent episode, severe. Continue buspar, lamictal and seroquel as started by psych service in the emergency room. No longer meets inpatient psych criteria as per psych.   Dementia with behavioral disturbance: continue w/ supportive care. No longer meets inpatient psych criteria as per psych   CKDIIIa: Cr is better than baseline. Continue to hold metformin, linagliptin, and oxybutynin.   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 Avoid avn blockers or other potential meds to exacerbate it. Pt evaluated for presyncope related to symptomatic bradycardia back in December. Had an unremarkable echocardiogram. Pacemaker was not felt necessary at the time. Should follow up with Dr. [name] as an outpatient 1 week after discharge for possible stress test and further evaluation regarding the indications for possible pacemaker placement in the near-future.   DM2: pretty well controlled w/ HbA1c 6.2. Continue to hold home dose metformin and linagliptin. Continue on SSI w/ accuchecks   History of breast cancer: no acute concerns at this time  Post-surgical hypothyroidism: continue levothyroxine  Hypertension: continue on amlodipine & restart losartan. Hydralazine prn   Hypotension: resolved  DVT prophylaxis: lovenox Code Status: full  Family Communication:    Disposition Plan: medically stable for transfer to inpatient psych but psych now saying that pt does not meet criteria for inpatient psych facility. PT recs SNF. CM is working on placement to SNF still but difficult placement secondary to pt's psych hx   Consultants:   Psych  cardio   Procedures:    Antimicrobials:    Subjective: Pt is ready to be d/c from the hospital still.   Objective: Vitals:   11/14/19 1224 11/14/19 1722 11/14/19 2003 11/15/19 0544  BP: (!) 154/77 (!) 167/80 (!) 180/83 (!) 159/76  Pulse: 62 74 (!) 52 67  Resp: 17 18 18 19   Temp:  98.3 F (36.8 C) (!) 97.5 F (36.4 C) 98.7 F (37.1 C)  TempSrc:  Oral  Oral  SpO2: 100% 100% 100% 98%  Weight:      Height:       No intake or output data in the 24 hours ending 11/15/19 0805 Filed Weights   11/11/19 0524 11/13/19 0500 11/14/19 0343  Weight: 107.4 kg 103.6 kg 103.2 kg    Examination:  General exam: Appears calm and comfortable Respiratory system: Clear to auscultation. No wheezes, rhonchi. B/L LE edema Cardiovascular system: S1 & S2 +. No rubs, gallops or clicks.  Gastrointestinal system: Abdomen is obese, soft and nontender. Normal bowel sounds heard. Central nervous system: Alert and oriented. Moves all 4 extremities  Psychiatry: Flat mood and affect     Data Reviewed: I have personally reviewed following labs and imaging studies  CBC: Recent Labs  Lab 11/11/19 0641 11/12/19 0538 11/13/19 0719 11/14/19 0629 11/15/19 0516  WBC 5.4 4.9 6.2 4.7 6.1  HGB 11.1* 11.9* 12.2 12.4 12.1  HCT 34.5* 36.3 37.6 37.4  37.2  MCV 85.8 83.4 84.7 84.8 84.5  PLT 208 221 255 252 [id number]   Basic Metabolic Panel: Recent Labs  Lab 11/11/19 0641 11/12/19 0538 11/13/19 0719 11/14/19 0629 11/15/19 0516  NA 141 139 139 140 140  K 4.4 3.9 3.8 3.8 4.0  CL 109 106 106 109 107  CO2 25 24 23 25 24   GLUCOSE 104* 98 112* 134* 105*  BUN 16 17 18 20  26*  CREATININE 0.89 0.93 0.95 0.88 0.91  CALCIUM 8.9 9.2  9.4 9.7 9.6   GFR: Estimated Creatinine Clearance: 58.1 mL/min (by C-G formula based on SCr of 0.91 mg/dL). Liver Function Tests: No results for input(s): AST, ALT, ALKPHOS, BILITOT, PROT, ALBUMIN in the last 168 hours. No results for input(s): LIPASE, AMYLASE in the last 168 hours. No results for input(s): AMMONIA in the last 168 hours. Coagulation Profile: No results for input(s): INR, PROTIME in the last 168 hours. Cardiac Enzymes: No results for input(s): CKTOTAL, CKMB, CKMBINDEX, TROPONINI in the last 168 hours. BNP (last 3 results) No results for input(s): PROBNP in the last 8760 hours. HbA1C: No results for input(s): HGBA1C in the last 72 hours. CBG: Recent Labs  Lab 11/13/19 2135 11/14/19 0801 11/14/19 1224 11/14/19 1720 11/14/19 2116  GLUCAP 116* 110* 130* 91 144*   Lipid Profile: No results for input(s): CHOL, HDL, LDLCALC, TRIG, CHOLHDL, LDLDIRECT in the last 72 hours. Thyroid Function Tests: No results for input(s): TSH, T4TOTAL, FREET4, T3FREE, THYROIDAB in the last 72 hours. Anemia Panel: No results for input(s): VITAMINB12, FOLATE, FERRITIN, TIBC, IRON, RETICCTPCT in the last 72 hours. Sepsis Labs: No results for input(s): PROCALCITON, LATICACIDVEN in the last 168 hours.  Recent Results (from the past 240 hour(s))  MRSA PCR Screening     Status: None   Collection Time: 11/07/19 12:47 AM   Specimen: Nasopharyngeal  Result Value Ref Range Status   MRSA by PCR NEGATIVE NEGATIVE Final    Comment:        The GeneXpert MRSA Assay (FDA approved for NASAL specimens only), is one component of a comprehensive MRSA colonization surveillance program. It is not intended to diagnose MRSA infection nor to guide or monitor treatment for MRSA infections. Performed at [facility], [address], [place], [place] [address]          Radiology Studies: No results found.      Scheduled Meds: . amLODipine  5 mg Oral Daily  . aspirin EC  81 mg  Oral Daily  . atorvastatin  40 mg Oral QHS  . busPIRone  10 mg Oral BID  . enoxaparin (LOVENOX) injection  40 mg Subcutaneous Q24H  . insulin aspart  0-20 Units Subcutaneous TID WC  . insulin aspart  0-5 Units Subcutaneous QHS  . lamoTRIgine  200 mg Oral Daily  . levothyroxine  200 mcg Oral Q0600  . pantoprazole  40 mg Oral Daily  . QUEtiapine  25 mg Oral BID  . sertraline  25 mg Oral Daily   Continuous Infusions:   LOS: 7 days    Time spent: 30 mins    Wyvonnia Dusky, MD [facility] Pager [phone]  If 7PM-7AM, please contact night-coverage [email or web address] 11/15/2019, 8:05 AM

## 2019-11-15 NOTE — Progress Notes (Signed)
Physical Therapy Treatment Patient Details Name: Melanie Young MRN: [id number] DOB: [birth year] Today's Date: 11/15/2019    History of Present Illness Patient is a 79 y/o F that returned to ED from facility due to AMS and bradycardia. She has been quite emotional, noted to have been tearful throughout much of the week she has been in ED. Per RN, she has been having difficulty with transfers and ambulation.    PT Comments    Pt was seated in recliner upon arriving. She is alert throughout session but is disoriented. Pt was able to follow commands throughout consistently. " I hurt all over." Pt rated her pain 8/10 and states its mostly in her knees. Pt still agrees to PT session and standing activity. She stood 4 x throughout session from recliner with min assist + vcs. Constant encouragement to full participate. Pt was able to take ten steps with very antalgic/kyphotic posture. Pt struggles with clearing LEs during swing phase. Constant vcs for posture correction and improved safety. Pt is limited by pain. +2 assist for pt when ambulating for safety. Therapist used chair follow during session throughout. At conclusion of session, pt was repositioned in recliner with chair alarm in place and call bell in reach. PT will continue to follow pt per POC and progress as able per pt tolerance. PT recommends DC to SNF to address deficits with gait, strength, and overall safe functional mobility.     Follow Up Recommendations  SNF     Equipment Recommendations  Rolling walker with 5" wheels    Recommendations for Other Services       Precautions / Restrictions Precautions Precautions: Fall Precaution Comments: audible crepitus bilat knees Restrictions Weight Bearing Restrictions: No    Mobility  Bed Mobility               General bed mobility comments: Pt was seated in recliner pre/post session  Transfers Overall transfer level: Needs assistance Equipment used: Rolling walker (2  wheeled) Transfers: Sit to/from Stand Sit to Stand: +2 safety/equipment;Min assist         General transfer comment: Pt performed STS 4 x from recliner. Vcs for handplacement and improved technique for safety. CGA by end of session to stand with increased time  Ambulation/Gait Ambulation/Gait assistance: +2 safety/equipment;Min assist Gait Distance (Feet): 10 Feet Assistive device: Rolling walker (2 wheeled) Gait Pattern/deviations: Trunk flexed;Shuffle;Decreased stance time - left;Decreased stance time - right;Decreased step length - left;Decreased step length - right;Antalgic Gait velocity: decreased   General Gait Details: pt takes very small shuffling steps ~ 10 ft with RW + recliner follow for safety. Pt c/o sever knee pain during all wt bearing. Poor gait posture and pattern. Pt struggles to tolerate wt bearing 2/2 to pain which impacts her ability to progress opposite LE Surveyor, minerals Rankin (Stroke Patients Only)       Balance Overall balance assessment: Needs assistance Sitting-balance support: No upper extremity supported;Feet supported Sitting balance-Leahy Scale: Good Sitting balance - Comments: No LOB seated reaching outside BOS   Standing balance support: Bilateral upper extremity supported Standing balance-Leahy Scale: Poor Standing balance comment: Severe kyphotic posture noted, unable to safely take any steps with RW.                            Cognition Arousal/Alertness: Awake/alert Behavior During Therapy: [facility]  for tasks assessed/performed Overall Cognitive Status: No family/caregiver present to determine baseline cognitive functioning Area of Impairment: Safety/judgement;Problem solving;Awareness                         Safety/Judgement: Decreased awareness of safety;Decreased awareness of deficits Awareness: Intellectual Problem Solving: Requires tactile  cues;Requires verbal cues;Difficulty sequencing;Decreased initiation;Slow processing General Comments: Pt was cooperative and able to follow commands throughout but has slow processing and is disoriented x 3.       Exercises      General Comments        Pertinent Vitals/Pain Pain Assessment: 0-10 Pain Score: 8  Faces Pain Scale: Hurts even more Pain Location: BLE knee pain Pain Descriptors / Indicators: Aching;Grimacing;Moaning Pain Intervention(s): Limited activity within patient's tolerance;Monitored during session    Home Living                      Prior Function            PT Goals (current goals can now be found in the care plan section) Acute Rehab PT Goals Patient Stated Goal: none stated Progress towards PT goals: Progressing toward goals    Frequency    Min 2X/week      PT Plan Current plan remains appropriate    Co-evaluation              AM-PAC PT "6 Clicks" Mobility   Outcome Measure  Help needed turning from your back to your side while in a flat bed without using bedrails?: A Little Help needed moving from lying on your back to sitting on the side of a flat bed without using bedrails?: A Little Help needed moving to and from a bed to a chair (including a wheelchair)?: A Little Help needed standing up from a chair using your arms (e.g., wheelchair or bedside chair)?: A Little Help needed to walk in hospital room?: A Lot Help needed climbing 3-5 steps with a railing? : Total 6 Click Score: 15    End of Session Equipment Utilized During Treatment: Gait belt Activity Tolerance: Patient limited by pain Patient left: in chair;with call bell/phone within reach;with chair alarm set Nurse Communication: Mobility status PT Visit Diagnosis: Difficulty in walking, not elsewhere classified (R26.2);History of falling (Z91.81);Pain Pain - Right/Left: Right Pain - part of body: Knee     Time: [id number] PT Time Calculation (min) (ACUTE  ONLY): 15 min  Charges:  $Gait Training: 8-22 mins                     Julaine Fusi PTA 11/15/19, 11:40 AM

## 2019-11-16 LAB — CBC
HCT: 36.3 % (ref 36.0–46.0)
Hemoglobin: 11.9 g/dL — ABNORMAL LOW (ref 12.0–15.0)
MCH: 27.7 pg (ref 26.0–34.0)
MCHC: 32.8 g/dL (ref 30.0–36.0)
MCV: 84.4 fL (ref 80.0–100.0)
Platelets: 249 10*3/uL (ref 150–400)
RBC: 4.3 MIL/uL (ref 3.87–5.11)
RDW: 15.6 % — ABNORMAL HIGH (ref 11.5–15.5)
WBC: 4.7 10*3/uL (ref 4.0–10.5)
nRBC: 0 % (ref 0.0–0.2)

## 2019-11-16 LAB — BASIC METABOLIC PANEL
Anion gap: 7 (ref 5–15)
BUN: 28 mg/dL — ABNORMAL HIGH (ref 8–23)
CO2: 25 mmol/L (ref 22–32)
Calcium: 9.3 mg/dL (ref 8.9–10.3)
Chloride: 110 mmol/L (ref 98–111)
Creatinine, Ser: 0.99 mg/dL (ref 0.44–1.00)
GFR calc Af Amer: >60 mL/min (ref >60)
GFR calc non Af Amer: 55 mL/min — ABNORMAL LOW (ref >60)
Glucose, Bld: 108 mg/dL — ABNORMAL HIGH (ref 70–99)
Potassium: 4 mmol/L (ref 3.5–5.1)
Sodium: 142 mmol/L (ref 135–145)

## 2019-11-16 LAB — GLUCOSE, CAPILLARY
Glucose-Capillary: 96 mg/dL (ref 70–99)
Glucose-Capillary: 116 mg/dL — ABNORMAL HIGH (ref 70–99)
Glucose-Capillary: 147 mg/dL — ABNORMAL HIGH (ref 70–99)
Glucose-Capillary: 132 mg/dL — ABNORMAL HIGH (ref 70–99)

## 2019-11-16 NOTE — Progress Notes (Signed)
PROGRESS NOTE    Melanie Young  [id number] DOB: [birth year] DOA: 11/03/2019 PCP: System, Pcp Not In     Assessment & Plan:   Principal Problem:   Hypotension Active Problems:   Acute kidney injury ([place])   Sinus bradycardia   Dementia with behavioral disturbance ([place])   Type 2 diabetes mellitus with hyperlipidemia ([place])   History of breast cancer   Essential hypertension   Post-surgical hypothyroidism   CKD (chronic kidney disease) stage 3, GFR 30-59 ml/min   AKI (acute kidney injury) ([place])   Major depressive disorder, recurrent episode, moderate (HCC)  Major depressive disorder: recurrent episode, severe. Continue buspar, lamictal and seroquel as started by psych service in the emergency room. No longer meets inpatient psych criteria as per psych.   Dementia with behavioral disturbance: continue w/ supportive care. No longer meets inpatient psych criteria as per psych   CKDIIIa: Cr is better than baseline. Continue to hold metformin, linagliptin, and oxybutynin.   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 Avoid avn blockers or other potential meds to exacerbate it. Pt evaluated for presyncope related to symptomatic bradycardia back in December. Had an unremarkable echocardiogram. Pacemaker was not felt necessary at the time. Should follow up with Dr. [name] as an outpatient 1 week after discharge for possible stress test and further evaluation regarding the indications for possible pacemaker placement in the near-future. Cardio signed off  DM2: pretty well controlled w/ HbA1c 6.2. Continue to hold home dose metformin and linagliptin. Continue on SSI w/ accuchecks   History of breast cancer: no acute concerns at this time  Post-surgical hypothyroidism: continue levothyroxine  Hypertension: continue on amlodipine & restart losartan. Hydralazine prn   Hypotension: resolved  DVT prophylaxis: lovenox Code Status: full  Family  Communication:  Disposition Plan: medically stable for transfer to inpatient psych but psych saying that pt does not meet criteria for inpatient psych facility anymore. PT recs SNF. CM is working on placement to SNF still but difficult placement secondary to pt's psych hx   Consultants:   Psych  cardio   Procedures:    Antimicrobials:    Subjective: Pt denies any complaints other than wanting to be d/c from the hospital.   Objective: Vitals:   11/15/19 1830 11/15/19 1946 11/16/19 0514 11/16/19 0754  BP: (!) 160/76 (!) 172/70 (!) 109/56 127/69  Pulse:  82 66 74  Resp:    17  Temp:  98.4 F (36.9 C) 98.4 F (36.9 C) 98.6 F (37 C)  TempSrc:  Oral Oral Oral  SpO2:  100% 100% 98%  Weight:   102.8 kg   Height:        Intake/Output Summary (Last 24 hours) at 11/16/2019 0815 Last data filed at 11/16/2019 0618 Gross per 24 hour  Intake 360 ml  Output 400 ml  Net -40 ml   Filed Weights   11/14/19 0343 11/15/19 1118 11/16/19 0514  Weight: 103.2 kg 103.6 kg 102.8 kg    Examination:  General exam: Appears calm and comfortable Respiratory system: Clear to auscultation. No wheezes, rhonchi. B/L LE edema Cardiovascular system: S1 & S2 +. No rubs, gallops or clicks.  Gastrointestinal system: Abdomen is obese, soft and nontender. Normal bowel sounds heard. Central nervous system: Alert and oriented. Moves all 4 extremities  Psychiatry: Flat mood and affect     Data Reviewed: I have personally reviewed following labs and imaging studies  CBC: Recent Labs  Lab 11/12/19 0538 11/13/19 0719 11/14/19 0629 11/15/19  [id number] 11/16/19 0459  WBC 4.9 6.2 4.7 6.1 4.7  HGB 11.9* 12.2 12.4 12.1 11.9*  HCT 36.3 37.6 37.4 37.2 36.3  MCV 83.4 84.7 84.8 84.5 84.4  PLT 221 255 252 257 0000000   Basic Metabolic Panel: Recent Labs  Lab 11/12/19 0538 11/13/19 0719 11/14/19 0629 11/15/19 0516 11/16/19 0459  NA 139 139 140 140 142  K 3.9 3.8 3.8 4.0 4.0  CL 106 106 109 107 110  CO2  24 23 25 24 25   GLUCOSE 98 112* 134* 105* 108*  BUN 17 18 20  26* 28*  CREATININE 0.93 0.95 0.88 0.91 0.99  CALCIUM 9.2 9.4 9.7 9.6 9.3   GFR: Estimated Creatinine Clearance: 53.3 mL/min (by C-G formula based on SCr of 0.99 mg/dL). Liver Function Tests: No results for input(s): AST, ALT, ALKPHOS, BILITOT, PROT, ALBUMIN in the last 168 hours. No results for input(s): LIPASE, AMYLASE in the last 168 hours. No results for input(s): AMMONIA in the last 168 hours. Coagulation Profile: No results for input(s): INR, PROTIME in the last 168 hours. Cardiac Enzymes: No results for input(s): CKTOTAL, CKMB, CKMBINDEX, TROPONINI in the last 168 hours. BNP (last 3 results) No results for input(s): PROBNP in the last 8760 hours. HbA1C: No results for input(s): HGBA1C in the last 72 hours. CBG: Recent Labs  Lab 11/15/19 0826 11/15/19 1118 11/15/19 1647 11/15/19 2135 11/16/19 0754  GLUCAP 115* 142* 89 115* 96   Lipid Profile: No results for input(s): CHOL, HDL, LDLCALC, TRIG, CHOLHDL, LDLDIRECT in the last 72 hours. Thyroid Function Tests: No results for input(s): TSH, T4TOTAL, FREET4, T3FREE, THYROIDAB in the last 72 hours. Anemia Panel: No results for input(s): VITAMINB12, FOLATE, FERRITIN, TIBC, IRON, RETICCTPCT in the last 72 hours. Sepsis Labs: No results for input(s): PROCALCITON, LATICACIDVEN in the last 168 hours.  Recent Results (from the past 240 hour(s))  MRSA PCR Screening     Status: None   Collection Time: 11/07/19 12:47 AM   Specimen: Nasopharyngeal  Result Value Ref Range Status   MRSA by PCR NEGATIVE NEGATIVE Final    Comment:        The GeneXpert MRSA Assay (FDA approved for NASAL specimens only), is one component of a comprehensive MRSA colonization surveillance program. It is not intended to diagnose MRSA infection nor to guide or monitor treatment for MRSA infections. Performed at [facility], [address], [place], [place] [address]           Radiology Studies: No results found.      Scheduled Meds: . amLODipine  5 mg Oral Daily  . aspirin EC  81 mg Oral Daily  . atorvastatin  40 mg Oral QHS  . busPIRone  10 mg Oral BID  . enoxaparin (LOVENOX) injection  40 mg Subcutaneous Q12H  . insulin aspart  0-20 Units Subcutaneous TID WC  . insulin aspart  0-5 Units Subcutaneous QHS  . lamoTRIgine  200 mg Oral Daily  . levothyroxine  200 mcg Oral Q0600  . losartan  50 mg Oral Daily  . pantoprazole  40 mg Oral Daily  . QUEtiapine  25 mg Oral BID  . sertraline  25 mg Oral Daily   Continuous Infusions:   LOS: 8 days    Time spent: 30 mins    Wyvonnia Dusky, MD [facility] Pager [phone]  If 7PM-7AM, please contact night-coverage [email or web address] 11/16/2019, 8:15 AM

## 2019-11-16 NOTE — Plan of Care (Signed)
  Problem: Safety: Goal: Ability to remain free from injury will improve Outcome: Progressing   Problem: Pain Managment: Goal: General experience of comfort will improve Outcome: Progressing   

## 2019-11-17 LAB — GLUCOSE, CAPILLARY
Glucose-Capillary: 98 mg/dL (ref 70–99)
Glucose-Capillary: 106 mg/dL — ABNORMAL HIGH (ref 70–99)
Glucose-Capillary: 101 mg/dL — ABNORMAL HIGH (ref 70–99)
Glucose-Capillary: 120 mg/dL — ABNORMAL HIGH (ref 70–99)

## 2019-11-17 LAB — BASIC METABOLIC PANEL
Anion gap: 8 (ref 5–15)
BUN: 26 mg/dL — ABNORMAL HIGH (ref 8–23)
CO2: 24 mmol/L (ref 22–32)
Calcium: 9.4 mg/dL (ref 8.9–10.3)
Chloride: 108 mmol/L (ref 98–111)
Creatinine, Ser: 0.95 mg/dL (ref 0.44–1.00)
GFR calc Af Amer: >60 mL/min (ref >60)
GFR calc non Af Amer: 58 mL/min — ABNORMAL LOW (ref >60)
Glucose, Bld: 116 mg/dL — ABNORMAL HIGH (ref 70–99)
Potassium: 3.7 mmol/L (ref 3.5–5.1)
Sodium: 140 mmol/L (ref 135–145)

## 2019-11-17 LAB — CBC
HCT: 36.2 % (ref 36.0–46.0)
Hemoglobin: 11.7 g/dL — ABNORMAL LOW (ref 12.0–15.0)
MCH: 27.5 pg (ref 26.0–34.0)
MCHC: 32.3 g/dL (ref 30.0–36.0)
MCV: 85.2 fL (ref 80.0–100.0)
Platelets: 268 10*3/uL (ref 150–400)
RBC: 4.25 MIL/uL (ref 3.87–5.11)
RDW: 15.8 % — ABNORMAL HIGH (ref 11.5–15.5)
WBC: 4.9 10*3/uL (ref 4.0–10.5)
nRBC: 0 % (ref 0.0–0.2)

## 2019-11-17 MED ORDER — ACETAMINOPHEN 325 MG PO TABS
650.0000 mg | ORAL_TABLET | Freq: Four times a day (QID) | ORAL | Status: DC | PRN
  Administered 2019-11-18 – 2019-11-20 (×2): 650 mg via ORAL
  Filled 2019-11-17 (×2): qty 2

## 2019-11-17 MED ORDER — ACETAMINOPHEN 650 MG RE SUPP: 650.0000 mg | Freq: Four times a day (QID) | RECTAL | Status: DC | PRN

## 2019-11-17 MED ORDER — OXYCODONE HCL 5 MG PO TABS
5.0000 mg | ORAL_TABLET | Freq: Four times a day (QID) | ORAL | Status: DC | PRN
  Administered 2019-11-17 – 2019-11-23 (×12): 5 mg via ORAL
  Filled 2019-11-17 (×12): qty 1

## 2019-11-17 NOTE — Care Management Important Message (Signed)
Important Message  Patient Details  Name: Melanie Young MRN: [id number] Date of Birth: September 18, 1940   Medicare Important Message Given:  Yes     Loann Quill 11/17/2019, 12:03 PM

## 2019-11-17 NOTE — TOC Progression Note (Signed)
Transition of Care [facility]) - Progression Note    Patient Details  Name: Melanie Young MRN: [id number] Date of Birth: 1941/06/19  Transition of Care [facility]) CM/SW Contact  Eileen Stanford, LCSW Phone Number: 11/17/2019, 3:19 PM  Clinical Narrative:  CSW attempted to reach pt's sister to see if she would be agreeable for pt to return to [facility]-- voicemail left. Pt is only alert to self and place.   CSW also called [place] and the lady for admitting is out of the office until tomorrow--due to emergency.         Expected Discharge Plan and Services                                                 Social Determinants of Health (SDOH) Interventions    Readmission Risk Interventions No flowsheet data found.

## 2019-11-17 NOTE — Progress Notes (Signed)
Physical Therapy Treatment Patient Details Name: Melanie Young MRN: [id number] DOB: Dec 30, 1940 Today's Date: 11/17/2019    History of Present Illness Patient is a 79 y/o F that returned to ED from facility due to AMS and bradycardia. She has been quite emotional, noted to have been tearful throughout much of the week she has been in ED. Per RN, she has been having difficulty with transfers and ambulation.    PT Comments    Patient initially agreeable to session, participating with seated therex and sit/stand x1, min assist with RW.  Sits spontaneously after 5-6 seconds of static stance, but able to initiate and complete sit/stand with less physical assist than required in previous sessions. Mid-session, TOC team member into room to deliver Medicare "important message" notice; patient believed care team member to be communicating information about bed-placement and discharge options (felt she was communicating to patient that facility was not available?).  Unable to redirect patient to correct orientation/information despite max efforts.  Progressively more agitated with attempts to explain and redirect.  Adamantly refused further participation with session at this time, "that's it!"  Will continue efforts at later time/date as patient allows.  Of note, goals set at initial evaluation good through 4/4; will re-address and update in subsequent session as appropriate (and patient agreeable to full participation).   Follow Up Recommendations  SNF     Equipment Recommendations  Rolling walker with 5" wheels    Recommendations for Other Services       Precautions / Restrictions Precautions Precautions: Fall Precaution Comments: audible crepitus bilat knees Restrictions Weight Bearing Restrictions: No    Mobility  Bed Mobility               General bed mobility comments: seated in recliner beginning/end of treatment session  Transfers Overall transfer level: Needs  assistance Equipment used: Rolling walker (2 wheeled)   Sit to Stand: Min guard         General transfer comment: increased time to complete, heavy use of bilat UEs required; spontaneously sits after 5-6 seconds static stance  Ambulation/Gait             General Gait Details: patient refused; unable to redirect   Stairs             Wheelchair Mobility    Modified Rankin (Stroke Patients Only)       Balance Overall balance assessment: Needs assistance Sitting-balance support: No upper extremity supported;Feet supported Sitting balance-Leahy Scale: Good     Standing balance support: Bilateral upper extremity supported Standing balance-Leahy Scale: Fair                              Cognition Arousal/Alertness: Awake/alert Behavior During Therapy: Agitated                                   General Comments: Distracted by delivery of Medicare "important letter", unable to redirect to session or participation with therapist afterwards      Exercises Other Exercises Other Exercises: Seated LE therex, 1x10, act ROM: ankle pumps, LAQs, marching.  Constant cuing, redirection for task at hand.  Very distractible by external environment    General Comments        Pertinent Vitals/Pain Pain Assessment: Faces Faces Pain Scale: Hurts little more Pain Location: BLE knee pain Pain Descriptors / Indicators: Aching;Grimacing;Moaning Pain Intervention(s): Limited activity  within patient's tolerance;Monitored during session;Repositioned    Home Living                      Prior Function            PT Goals (current goals can now be found in the care plan section) Acute Rehab PT Goals Patient Stated Goal: none stated PT Goal Formulation: Patient unable to participate in goal setting Time For Goal Achievement: 11/20/19 Potential to Achieve Goals: Fair Progress towards PT goals: Not progressing toward goals - comment(limited  participation with date, confused/agitated)    Frequency    Min 2X/week      PT Plan Current plan remains appropriate    Co-evaluation              AM-PAC PT "6 Clicks" Mobility   Outcome Measure  Help needed turning from your back to your side while in a flat bed without using bedrails?: A Little Help needed moving from lying on your back to sitting on the side of a flat bed without using bedrails?: A Little Help needed moving to and from a bed to a chair (including a wheelchair)?: A Little Help needed standing up from a chair using your arms (e.g., wheelchair or bedside chair)?: A Little Help needed to walk in hospital room?: A Little Help needed climbing 3-5 steps with a railing? : Total 6 Click Score: 16    End of Session Equipment Utilized During Treatment: Gait belt Activity Tolerance: Patient limited by pain;Treatment limited secondary to agitation Patient left: in chair;with call bell/phone within reach;with chair alarm set Nurse Communication: Mobility status PT Visit Diagnosis: Difficulty in walking, not elsewhere classified (R26.2);History of falling (Z91.81);Pain Pain - Right/Left: Right Pain - part of body: Knee     Time: [id number] PT Time Calculation (min) (ACUTE ONLY): 12 min  Charges:  $Therapeutic Exercise: 8-22 mins                     Aydn Ferrara H. Owens Shark, PT, DPT, NCS 11/17/19, 11:33 AM [phone]

## 2019-11-17 NOTE — Progress Notes (Signed)
PROGRESS NOTE    Melanie Young  [id number] DOB: Jul 24, 1941 DOA: 11/03/2019 PCP: System, Pcp Not In  HPI was taken from Dr. Damita Dunnings: Melanie Young is a 79 y.o. female with medical history significant for surgical hypothyroidism, type 2 diabetes, CKD 3, asymptomatic sinus bradycardia, dementia and hypertension, who developed hypotension while being boarded in the emergency room awaiting inpatient psychiatric admission.  Patient initially presented to the emergency room on 11/03/2019 with chief complaint of aggressive behavior.  She also had a complaint of chest pain and was medically cleared after ruling out with 2 negative troponins.  Patient was being evaluated by the behavioral health service daily while awaiting placement however on 11/06/2019 on routine vital check, she was noted to be hypotensive with blood pressure 86/59.  She was also noted to be a bit more somnolent and less active than her usual .  Blood work was done revealing a creatinine of 2.56, up from 0.98 when she initially arrived to the emergency room on 11/03/2019.  She was otherwise asymptomatic.  She had been running a low heart rate in the 40s and 50s but this is chronic for patient going back to prior admissions.  Patient denies chest pain or shortness of breath.  She denied headache, visual disturbance, lightheadedness or palpitations. ED Course: Current vitals in the emergency room, temp 97.9, BP 86/59, pulse 43, RR 18 with O2 sat 97% on room air.  Blood work with creatinine of 2.56, up from 0.98.  TSH done on arrival a couple days prior was 7.36 with T4 of 1.5.  Other blood work unremarkable.  EKG with sinus bradycardia.  Hospitalist consult requested.  Hospital Course from Dr. Lenise Herald 11/09/19-11/17/19: Pt was initially admitted for aggressive behavior and was being boarded in the emergency room pending inpatient psych admission. Pt was found to have suicidal ideations so a sitter was placed with the pt. Furthermore, pt was  found to have sinus bradycardia which was evaluated by cardion. Pt is currently a pacemaker candidate but pt should f/u outpatient w/ Dr. [name] in 1 week after d/c for possible stress test and further evaluation for the indications for possible pacemaker placement. Pt has since been medically cleared for admission to an inpatient psych facility but psychiatry re-evaluated the pt and decided that make no longer meets inpatient psych criteria. CM has since been trying to d/c pt to a SNF but pt is a difficult placement secondary to psych hx. Pt does not have anywhere else to go, as pt was asking about homeless shelters as well.    Assessment & Plan:   Principal Problem:   Hypotension Active Problems:   Acute kidney injury (HCC)   Sinus bradycardia   Dementia with behavioral disturbance (HCC)   Type 2 diabetes mellitus with hyperlipidemia (HCC)   History of breast cancer   Essential hypertension   Post-surgical hypothyroidism   CKD (chronic kidney disease) stage 3, GFR 30-59 ml/min   AKI (acute kidney injury) ([place])   Major depressive disorder, recurrent episode, moderate (HCC)  Major depressive disorder: recurrent episode, severe. Continue buspar, lamictal and seroquel as started by psych service in the emergency room. No longer meets inpatient psych criteria as per psych.   Dementia with behavioral disturbance: continue w/ supportive care. No longer meets inpatient psych criteria as per psych   CKDIIIa: Cr is better than baseline. Continue to hold metformin, linagliptin, and oxybutynin.   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 Avoid avn blockers or  other potential meds to exacerbate it. Pt evaluated for presyncope related to symptomatic bradycardia back in December. Had an unremarkable echocardiogram. Pacemaker was not felt necessary at the time. Should follow up with Dr. [name] as an outpatient 1 week after discharge for possible stress test and further  evaluation regarding the indications for possible pacemaker placement in the near-future. Cardio signed off  DM2: pretty well controlled w/ HbA1c 6.2. Continue to hold home dose metformin and linagliptin. Continue on SSI w/ accuchecks   History of breast cancer: no acute concerns at this time  Post-surgical hypothyroidism: continue levothyroxine  Hypertension: continue on amlodipine & restart losartan. Hydralazine prn   Hypotension: resolved  DVT prophylaxis: lovenox Code Status: full  Family Communication:  Disposition Plan: medically stable for transfer to inpatient psych but psych saying that pt does not meet criteria for inpatient psych facility anymore. PT recs SNF. CM is working on placement to SNF still but difficult placement secondary to pt's psych hx. [facility] will accept pt back but pt refuses to go back there.   Consultants:   Psych  cardio   Procedures:    Antimicrobials:    Subjective: Pt denies any complaints other than wanting to be d/c from the hospital still   Objective: Vitals:   11/16/19 1613 11/16/19 1915 11/17/19 0404 11/17/19 0754  BP: 119/77 (!) 155/74 (!) 149/75 (!) 173/91  Pulse: 73 67 (!) 57 (!) 55  Resp: 17  20 19   Temp: 98.6 F (37 C) 98.8 F (37.1 C) 97.8 F (36.6 C) 97.9 F (36.6 C)  TempSrc: Oral Oral Oral Oral  SpO2: 100% 100% 99% 99%  Weight:      Height:        Intake/Output Summary (Last 24 hours) at 11/17/2019 0820 Last data filed at 11/17/2019 [id number] Gross per 24 hour  Intake 480 ml  Output 600 ml  Net -120 ml   Filed Weights   11/14/19 0343 11/15/19 1118 11/16/19 0514  Weight: 103.2 kg 103.6 kg 102.8 kg    Examination:  General exam: Appears calm and comfortable Respiratory system: Clear to auscultation. No wheezes, rhonchi. B/L LE edema Cardiovascular system: S1 & S2 +. No rubs, gallops or clicks.  Gastrointestinal system: Abdomen is obese, soft and nontender. Normal bowel sounds heard. Central nervous  system: Alert and oriented. Moves all 4 extremities  Psychiatry: Flat mood and affect     Data Reviewed: I have personally reviewed following labs and imaging studies  CBC: Recent Labs  Lab 11/13/19 0719 11/14/19 0629 11/15/19 0516 11/16/19 0459 11/17/19 0543  WBC 6.2 4.7 6.1 4.7 4.9  HGB 12.2 12.4 12.1 11.9* 11.7*  HCT 37.6 37.4 37.2 36.3 36.2  MCV 84.7 84.8 84.5 84.4 85.2  PLT 255 252 257 249 [id number]   Basic Metabolic Panel: Recent Labs  Lab 11/13/19 0719 11/14/19 0629 11/15/19 0516 11/16/19 0459 11/17/19 0543  NA 139 140 140 142 140  K 3.8 3.8 4.0 4.0 3.7  CL 106 109 107 110 108  CO2 23 25 24 25 24   GLUCOSE 112* 134* 105* 108* 116*  BUN 18 20 26* 28* 26*  CREATININE 0.95 0.88 0.91 0.99 0.95  CALCIUM 9.4 9.7 9.6 9.3 9.4   GFR: Estimated Creatinine Clearance: 55.5 mL/min (by C-G formula based on SCr of 0.95 mg/dL). Liver Function Tests: No results for input(s): AST, ALT, ALKPHOS, BILITOT, PROT, ALBUMIN in the last 168 hours. No results for input(s): LIPASE, AMYLASE in the last 168 hours. No results for input(s):  AMMONIA in the last 168 hours. Coagulation Profile: No results for input(s): INR, PROTIME in the last 168 hours. Cardiac Enzymes: No results for input(s): CKTOTAL, CKMB, CKMBINDEX, TROPONINI in the last 168 hours. BNP (last 3 results) No results for input(s): PROBNP in the last 8760 hours. HbA1C: No results for input(s): HGBA1C in the last 72 hours. CBG: Recent Labs  Lab 11/16/19 0754 11/16/19 1200 11/16/19 1614 11/16/19 2053 11/17/19 0756  GLUCAP 96 116* 147* 132* 98   Lipid Profile: No results for input(s): CHOL, HDL, LDLCALC, TRIG, CHOLHDL, LDLDIRECT in the last 72 hours. Thyroid Function Tests: No results for input(s): TSH, T4TOTAL, FREET4, T3FREE, THYROIDAB in the last 72 hours. Anemia Panel: No results for input(s): VITAMINB12, FOLATE, FERRITIN, TIBC, IRON, RETICCTPCT in the last 72 hours. Sepsis Labs: No results for input(s):  PROCALCITON, LATICACIDVEN in the last 168 hours.  No results found for this or any previous visit (from the past 240 hour(s)).       Radiology Studies: No results found.      Scheduled Meds: . amLODipine  5 mg Oral Daily  . aspirin EC  81 mg Oral Daily  . atorvastatin  40 mg Oral QHS  . busPIRone  10 mg Oral BID  . enoxaparin (LOVENOX) injection  40 mg Subcutaneous Q12H  . insulin aspart  0-20 Units Subcutaneous TID WC  . insulin aspart  0-5 Units Subcutaneous QHS  . lamoTRIgine  200 mg Oral Daily  . levothyroxine  200 mcg Oral Q0600  . losartan  50 mg Oral Daily  . pantoprazole  40 mg Oral Daily  . QUEtiapine  25 mg Oral BID  . sertraline  25 mg Oral Daily   Continuous Infusions:   LOS: 9 days    Time spent: 30 mins    Wyvonnia Dusky, MD [facility] Pager [phone]  If 7PM-7AM, please contact night-coverage [email or web address] 11/17/2019, 8:20 AM

## 2019-11-18 LAB — GLUCOSE, CAPILLARY
Glucose-Capillary: 98 mg/dL (ref 70–99)
Glucose-Capillary: 98 mg/dL (ref 70–99)
Glucose-Capillary: 77 mg/dL (ref 70–99)
Glucose-Capillary: 125 mg/dL — ABNORMAL HIGH (ref 70–99)

## 2019-11-18 MED ORDER — AMLODIPINE BESYLATE 10 MG PO TABS
10.0000 mg | ORAL_TABLET | Freq: Every day | ORAL | Status: DC
  Administered 2019-11-19 – 2019-11-20 (×2): 10 mg via ORAL
  Filled 2019-11-18 (×2): qty 1

## 2019-11-18 MED ORDER — SERTRALINE HCL 50 MG PO TABS
50.0000 mg | ORAL_TABLET | Freq: Every day | ORAL | Status: DC
  Administered 2019-11-19 – 2019-11-23 (×5): 50 mg via ORAL
  Filled 2019-11-18 (×5): qty 1

## 2019-11-18 MED ORDER — HALOPERIDOL LACTATE 5 MG/ML IJ SOLN: 2.0000 mg | Freq: Three times a day (TID) | INTRAMUSCULAR | Status: DC | PRN

## 2019-11-18 MED ORDER — HALOPERIDOL 2 MG PO TABS
2.0000 mg | ORAL_TABLET | Freq: Three times a day (TID) | ORAL | Status: DC | PRN
  Filled 2019-11-18: qty 1

## 2019-11-18 NOTE — Progress Notes (Signed)
Patient up sitting in chair. Alert and oriented to questions, but believes that "there is a man out there". Pt reassured she is safe here, but she wants a wheelchair so she can leave. Explained to patient she will be going to another room tonight, but not leaving facility. Frustrated that she cannot get a wheelchair at this time. PM meds given, including Seroquel.   She also complains of knee pain; given Tylenol. BP was elevated earlier in shift. On recheck back down within limits. No medication intervention needed. Will call report to receiving unit.
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
## 2019-11-18 NOTE — Progress Notes (Signed)
Report called to receiving RN on 1C.  

## 2019-11-18 NOTE — Progress Notes (Signed)
Attempted to contact patient's sister, Baker Janus to inform her that pt is being transferred to Room 114 on 1C unit. Left message stating room number and phone number of 1C.

## 2019-11-19 ENCOUNTER — Inpatient Hospital Stay: Payer: Medicare Other

## 2019-11-19 LAB — GLUCOSE, CAPILLARY
Glucose-Capillary: 84 mg/dL (ref 70–99)
Glucose-Capillary: 120 mg/dL — ABNORMAL HIGH (ref 70–99)
Glucose-Capillary: 82 mg/dL (ref 70–99)
Glucose-Capillary: 121 mg/dL — ABNORMAL HIGH (ref 70–99)

## 2019-11-19 MED ORDER — ENOXAPARIN SODIUM 40 MG/0.4ML ~~LOC~~ SOLN
40.0000 mg | SUBCUTANEOUS | Status: DC
  Administered 2019-11-19 – 2019-11-22 (×4): 40 mg via SUBCUTANEOUS
  Filled 2019-11-19 (×4): qty 0.4

## 2019-11-19 NOTE — Progress Notes (Signed)
Dr Dwyane Dee made aware that pt with BLE swelling, [place] ordered

## 2019-11-19 NOTE — Progress Notes (Signed)
PROGRESS NOTE    Melanie Young  [id number] DOB: [birth year] DOA: 11/03/2019 PCP: System, Pcp Not In   Brief Narrative:  HPI was taken from Dr. Damita Dunnings: Melanie Sauer Rogersis a 79 y.o.femalewith medical history significant forsurgical hypothyroidism, type 2 diabetes, CKD 3, asymptomatic sinus bradycardia, dementia and hypertension, who developed hypotension while being boarded in the emergency room awaiting inpatient psychiatric admission. Patient initially presented to the emergency room on 3/18/2021with chief complaint of aggressive behavior. She also had a complaint of chest pain and was medically cleared after ruling out with 2 negativetroponins. Patient was being evaluated by the behavioral health service daily while awaiting placement however on 11/06/2019 on routine vital check, she was noted to be hypotensive with blood pressure 86/59. She was also noted to be a bit more somnolent and less active than her usual .Blood work was done revealing a creatinine of 2.56, up from 0.98 when she initially arrived to the emergency room on 11/03/2019.She was otherwise asymptomatic. She had been running a low heart rate in the 40s and 50s but this is chronic for patient going back to prior admissions.Patient denies chest pain or shortness of breath. She denied headache, visual disturbance, lightheadedness or palpitations.  Hospital Course from Dr. Lenise Herald 11/09/19-11/17/19: Pt was initially admitted for aggressive behavior and was being boarded in the emergency room pending inpatient psych admission. Pt was found to have suicidal ideations so a sitter was placed with the pt. Furthermore, pt was found to have sinus bradycardia which was evaluated by cardion. Pt is currently a pacemaker candidate but pt should f/u outpatient w/ Dr. [name] in 1 week after d/c for possible stress test and further evaluation for the indications for possible pacemaker placement. Pt has since been medically cleared for  admission to an inpatient psych facility but psychiatry re-evaluated the pt and decided that make no longer meets inpatient psych criteria. CM has since been trying to d/c pt to a SNF but pt is a difficult placement secondary to psych hx. Pt does not have anywhere else to go, as pt was asking about homeless shelters as well.    4/3: Patient was sleepy, denied any other active issues.  Patient is AO x2 knows her name and knows that she is in the hospital, unable to offer any other complaints.  Patient stated that she is having bilateral knee pain    Assessment & Plan:   Principal Problem:   Hypotension Active Problems:   Acute kidney injury ([place])   Sinus bradycardia   Dementia with behavioral disturbance (HCC)   Type 2 diabetes mellitus with hyperlipidemia (HCC)   History of breast cancer   Essential hypertension   Post-surgical hypothyroidism   CKD (chronic kidney disease) stage 3, GFR 30-59 ml/min   AKI (acute kidney injury) ([place])   Major depressive disorder, recurrent episode, moderate (HCC)  Major depressive disorder: recurrent episode, severe.  Continue buspar, lamictal and seroquel as started by psych service in the emergency room.  No longer meets inpatient psych criteria as per psych.  4/2: Patient was apparently crying and screaming inconsolably this morning.  Was visited by the inpatient chaplain who was able to redirect and calm her down.  I requested repeat evaluation by psychiatric service.  Message sent to Dr. Claris Gower.  Their recommendations are appreciated.  Also requested a decision-making capacity evaluation per psychiatry.  Dementia with behavioral disturbance  continue w/ supportive care.  No longer meets inpatient psych criteria as per psych   CKDIIIa:  Cr is  better than baseline.  Continue to hold metformin, linagliptin, and oxybutynin.   Sinus bradycardia:  chronic sinus bradycardia in the 40s and 50s, asymptomatic. Avoid avn blockers or other potential  meds to exacerbate it. Pt evaluated for presyncope related to symptomatic bradycardia back in December. Had an unremarkable echocardiogram. Pacemaker was not felt necessary at the time. Should follow up with Dr. [name] as an outpatient 1 week after discharge for possible stress test and further evaluation regarding the indications for possible pacemaker placement in the near-future.Cardio signed off  DM2:  pretty well controlled w/ HbA1c 6.2.  Continue to hold home dose metformin and linagliptin.  Continue on SSI w/ accuchecks   History of breast cancer:  no acute concerns at this time  Post-surgical hypothyroidism:  continue levothyroxine  Hypertension: continue on amlodipine & restart losartan. Hydralazine prn   Hypotension: resolved  Lower extremity edema Venous duplex: No evidence of deep venous thrombosis in either lower extremity. Mildly complex Baker's cyst in the left popliteal fossa.   DVT prophylaxis: Lovenox Code Status: Full Family Communication: None today Disposition Plan: Unclear at this time.  Medically stable for discharge however there is not a safe disposition plan.  Psychiatry had signed off several days ago stating that patient does not meet criteria for inpatient psych facility.  Physical therapy recommends skilled nursing facility.  Case management is evaluating for placement.  [facility] will accept the patient does not wish to return there.  Per case management the patient endorsed inappropriate behavior involving a female staff member at [facility] and does not wish to return.   Consultants:   Psychiatry  Procedures:   None  Antimicrobials:   None   Subjective: Patient seen and examined Flat affect, will not participate in conversation  Objective: Vitals:   11/18/19 2310 11/19/19 0753 11/19/19 0824 11/19/19 1631  BP: (!) 144/65 (!) 162/89  (!) 153/78  Pulse: (!) 57 (!) 56 62 63  Resp: (!) 21 19    Temp: 98.8 F (37.1 C) 98.3  F (36.8 C)  98.7 F (37.1 C)  TempSrc: Oral Oral  Oral  SpO2: 100% 100%  98%  Weight:      Height:        Intake/Output Summary (Last 24 hours) at 11/19/2019 1656 Last data filed at 11/19/2019 1345 Gross per 24 hour  Intake 240 ml  Output --  Net 240 ml   Filed Weights   11/15/19 1118 11/16/19 0514 11/18/19 2303  Weight: 103.6 kg 102.8 kg 102.4 kg    Examination:  General exam: Appears calm and comfortable Respiratory system: Clear to auscultation. No wheezes, rhonchi. B/L LE edema Cardiovascular system: S1 & S2 +. No rubs, gallops or clicks.  Gastrointestinal system: Abdomen is obese, soft and nontender. Normal bowel sounds heard. Central nervous system: Alert and oriented. Moves all 4 extremities  Psychiatry: Flat mood and affect   Data Reviewed: I have personally reviewed following labs and imaging studies  CBC: Recent Labs  Lab 11/13/19 0719 11/14/19 0629 11/15/19 0516 11/16/19 0459 11/17/19 0543  WBC 6.2 4.7 6.1 4.7 4.9  HGB 12.2 12.4 12.1 11.9* 11.7*  HCT 37.6 37.4 37.2 36.3 36.2  MCV 84.7 84.8 84.5 84.4 85.2  PLT 255 252 257 249 [id number]   Basic Metabolic Panel: Recent Labs  Lab 11/13/19 0719 11/14/19 0629 11/15/19 0516 11/16/19 0459 11/17/19 0543  NA 139 140 140 142 140  K 3.8 3.8 4.0 4.0 3.7  CL 106 109 107 110 108  CO2  23 25 24 25 24   GLUCOSE 112* 134* 105* 108* 116*  BUN 18 20 26* 28* 26*  CREATININE 0.95 0.88 0.91 0.99 0.95  CALCIUM 9.4 9.7 9.6 9.3 9.4   GFR: Estimated Creatinine Clearance: 62 mL/min (by C-G formula based on SCr of 0.95 mg/dL). Liver Function Tests: No results for input(s): AST, ALT, ALKPHOS, BILITOT, PROT, ALBUMIN in the last 168 hours. No results for input(s): LIPASE, AMYLASE in the last 168 hours. No results for input(s): AMMONIA in the last 168 hours. Coagulation Profile: No results for input(s): INR, PROTIME in the last 168 hours. Cardiac Enzymes: No results for input(s): CKTOTAL, CKMB, CKMBINDEX, TROPONINI in the last  168 hours. BNP (last 3 results) No results for input(s): PROBNP in the last 8760 hours. HbA1C: No results for input(s): HGBA1C in the last 72 hours. CBG: Recent Labs  Lab 11/18/19 1147 11/18/19 1630 11/18/19 2047 11/19/19 0755 11/19/19 1140  GLUCAP 98 77 125* 84 120*   Lipid Profile: No results for input(s): CHOL, HDL, LDLCALC, TRIG, CHOLHDL, LDLDIRECT in the last 72 hours. Thyroid Function Tests: No results for input(s): TSH, T4TOTAL, FREET4, T3FREE, THYROIDAB in the last 72 hours. Anemia Panel: No results for input(s): VITAMINB12, FOLATE, FERRITIN, TIBC, IRON, RETICCTPCT in the last 72 hours. Sepsis Labs: No results for input(s): PROCALCITON, LATICACIDVEN in the last 168 hours.  No results found for this or any previous visit (from the past 240 hour(s)).       Radiology Studies: US Venous Img Lower Bilateral (DVT)  Result Date: 11/19/2019 CLINICAL DATA:  79 year old female with bilateral lower extremity edema. EXAM: BILATERAL LOWER EXTREMITY VENOUS DOPPLER ULTRASOUND TECHNIQUE: Gray-scale sonography with graded compression, as well as color Doppler and duplex ultrasound were performed to evaluate the lower extremity deep venous systems from the level of the common femoral vein and including the common femoral, femoral, profunda femoral, popliteal and calf veins including the posterior tibial, peroneal and gastrocnemius veins when visible. The superficial great saphenous vein was also interrogated. Spectral Doppler was utilized to evaluate flow at rest and with distal augmentation maneuvers in the common femoral, femoral and popliteal veins. COMPARISON:  None. FINDINGS: RIGHT LOWER EXTREMITY Common Femoral Vein: No evidence of thrombus. Normal compressibility, respiratory phasicity and response to augmentation. Saphenofemoral Junction: No evidence of thrombus. Normal compressibility and flow on color Doppler imaging. Profunda Femoral Vein: No evidence of thrombus. Normal  compressibility and flow on color Doppler imaging. Femoral Vein: No evidence of thrombus. Normal compressibility, respiratory phasicity and response to augmentation. Popliteal Vein: No evidence of thrombus. Normal compressibility, respiratory phasicity and response to augmentation. Calf Veins: No evidence of thrombus. Normal compressibility and flow on color Doppler imaging. Superficial Great Saphenous Vein: No evidence of thrombus. Normal compressibility. Venous Reflux:  None. Other Findings:  None. LEFT LOWER EXTREMITY Common Femoral Vein: No evidence of thrombus. Normal compressibility, respiratory phasicity and response to augmentation. Saphenofemoral Junction: No evidence of thrombus. Normal compressibility and flow on color Doppler imaging. Profunda Femoral Vein: No evidence of thrombus. Normal compressibility and flow on color Doppler imaging. Femoral Vein: No evidence of thrombus. Normal compressibility, respiratory phasicity and response to augmentation. Popliteal Vein: No evidence of thrombus. Normal compressibility, respiratory phasicity and response to augmentation. Calf Veins: No evidence of thrombus. Normal compressibility and flow on color Doppler imaging. Superficial Great Saphenous Vein: No evidence of thrombus. Normal compressibility. Venous Reflux:  None. Other Findings: Mildly complex hypoechoic collection with irregular margins in the popliteal fossa measures 5.0 x 2.0 x 3.0 cm.  IMPRESSION: No evidence of deep venous thrombosis in either lower extremity. Mildly complex Baker's cyst in the left popliteal fossa. Electronically Signed   By: Jacqulynn Cadet M.D.   On: 11/19/2019 15:49        Scheduled Meds: . amLODipine  10 mg Oral Daily  . aspirin EC  81 mg Oral Daily  . atorvastatin  40 mg Oral QHS  . enoxaparin (LOVENOX) injection  40 mg Subcutaneous Q24H  . insulin aspart  0-20 Units Subcutaneous TID WC  . insulin aspart  0-5 Units Subcutaneous QHS  . lamoTRIgine  200 mg Oral  Daily  . levothyroxine  200 mcg Oral Q0600  . losartan  50 mg Oral Daily  . pantoprazole  40 mg Oral Daily  . QUEtiapine  25 mg Oral BID  . sertraline  50 mg Oral Daily   Continuous Infusions:   LOS: 11 days    Time spent: 25 minutes    Val Riles, MD [facility] Pager [phone]  If 7PM-7AM, please contact night-coverage 11/19/2019, 4:56 PM

## 2019-11-20 LAB — GLUCOSE, CAPILLARY
Glucose-Capillary: 91 mg/dL (ref 70–99)
Glucose-Capillary: 96 mg/dL (ref 70–99)
Glucose-Capillary: 90 mg/dL (ref 70–99)
Glucose-Capillary: 104 mg/dL — ABNORMAL HIGH (ref 70–99)

## 2019-11-20 MED ORDER — TORSEMIDE 20 MG PO TABS
20.0000 mg | ORAL_TABLET | Freq: Every day | ORAL | Status: DC
  Administered 2019-11-20 – 2019-11-23 (×4): 20 mg via ORAL
  Filled 2019-11-20 (×4): qty 1

## 2019-11-20 MED ORDER — HYDRALAZINE HCL 50 MG PO TABS
50.0000 mg | ORAL_TABLET | Freq: Three times a day (TID) | ORAL | Status: DC
  Administered 2019-11-20 – 2019-11-22 (×4): 50 mg via ORAL
  Filled 2019-11-20 (×5): qty 1

## 2019-11-20 NOTE — Progress Notes (Signed)
Nutrition Brief Note  Patient identified for LOS  79 year old African-American female with PMH significant for hypertension, bradycardia, DM type II, hypercholesterolemia, hypothyroidism, CKD, depression, vascular dementia and osteoporosis who presented to [facility] ED on 11/03/2019 with complaints of aggressive behavior and mild chest pain in which was medically cleared with 2 negative troponins.  While being boarded in the emergency room awaiting inpatient psychiatric admission, the patient was found to be hypotensive and bradycardic  Wt Readings from Last 15 Encounters:  11/18/19 102.4 kg  10/31/19 105.8 kg  09/10/19 105.8 kg  07/13/19 108.9 kg  06/02/19 108.9 kg  08/31/18 87.1 kg  08/10/17 112.8 kg  09/20/16 103 kg  10/19/15 103.4 kg  07/19/15 97.5 kg    Body mass index is 35.36 kg/m. Patient meets criteria for morbid obesity based on current BMI.   Current diet order is HH/CHO modified, patient is consuming approximately 75-100% of meals at this time. Labs and medications reviewed.   No nutrition interventions warranted at this time. If nutrition issues arise, please consult RD.   Koleen Distance MS, RD, LDN Contact information available in Amion

## 2019-11-20 NOTE — Progress Notes (Signed)
PROGRESS NOTE    Melanie Young  [id number] DOB: 10-27-1940 DOA: 11/03/2019 PCP: System, Pcp Not In   Brief Narrative:  HPI was taken from Dr. Damita Dunnings: Melanie Sauer Rogersis a 79 y.o.femalewith medical history significant forsurgical hypothyroidism, type 2 diabetes, CKD 3, asymptomatic sinus bradycardia, dementia and hypertension, who developed hypotension while being boarded in the emergency room awaiting inpatient psychiatric admission. Patient initially presented to the emergency room on 3/18/2021with chief complaint of aggressive behavior. She also had a complaint of chest pain and was medically cleared after ruling out with 2 negativetroponins. Patient was being evaluated by the behavioral health service daily while awaiting placement however on 11/06/2019 on routine vital check, she was noted to be hypotensive with blood pressure 86/59. She was also noted to be a bit more somnolent and less active than her usual .Blood work was done revealing a creatinine of 2.56, up from 0.98 when she initially arrived to the emergency room on 11/03/2019.She was otherwise asymptomatic. She had been running a low heart rate in the 40s and 50s but this is chronic for patient going back to prior admissions.Patient denies chest pain or shortness of breath. She denied headache, visual disturbance, lightheadedness or palpitations.  Hospital Course from Dr. Lenise Herald 11/09/19-11/17/19: Pt was initially admitted for aggressive behavior and was being boarded in the emergency room pending inpatient psych admission. Pt was found to have suicidal ideations so a sitter was placed with the pt. Furthermore, pt was found to have sinus bradycardia which was evaluated by cardion. Pt is currently a pacemaker candidate but pt should f/u outpatient w/ Dr. Ubaldo Young in 1 week after d/c for possible stress test and further evaluation for the indications for possible pacemaker placement. Pt has since been medically cleared for  admission to an inpatient psych facility but psychiatry re-evaluated the pt and decided that make no longer meets inpatient psych criteria. CM has since been trying to d/c pt to a SNF but pt is a difficult placement secondary to psych hx. Pt does not have anywhere else to go, as pt was asking about homeless shelters as well.    4/3: Patient was sleepy, denied any other active issues.  Patient is AO x2 knows her name and knows that she is in the hospital, unable to offer any other complaints.  Patient stated that she is having bilateral knee pain    Assessment & Plan:   Principal Problem:   Hypotension Active Problems:   Acute kidney injury ([place])   Sinus bradycardia   Dementia with behavioral disturbance (HCC)   Type 2 diabetes mellitus with hyperlipidemia (HCC)   History of breast cancer   Essential hypertension   Post-surgical hypothyroidism   CKD (chronic kidney disease) stage 3, GFR 30-59 ml/min   AKI (acute kidney injury) ([place])   Major depressive disorder, recurrent episode, moderate (HCC)  Major depressive disorder: recurrent episode, severe.  Continue buspar, lamictal and seroquel as started by psych service in the emergency room.  No longer meets inpatient psych criteria as per psych.  4/2: Patient was apparently crying and screaming inconsolably this morning.  Was visited by the inpatient chaplain who was able to redirect and calm her down.  I requested repeat evaluation by psychiatric service.  Message sent to Dr. Claris Gower.  Their recommendations are appreciated.  Also requested a decision-making capacity evaluation per psychiatry.  Dementia with behavioral disturbance  continue w/ supportive care.  No longer meets inpatient psych criteria as per psych   CKDIIIa:  Cr is  better than baseline.  Continue to hold metformin, linagliptin, and oxybutynin.   Sinus bradycardia:  chronic sinus bradycardia in the 40s and 50s, asymptomatic. Avoid avn blockers or other potential  meds to exacerbate it. Pt evaluated for presyncope related to symptomatic bradycardia back in December. Had an unremarkable echocardiogram. Pacemaker was not felt necessary at the time. Should follow up with Dr. Ubaldo Young as an outpatient 1 week after discharge for possible stress test and further evaluation regarding the indications for possible pacemaker placement in the near-future.Cardio signed off  DM2:  pretty well controlled w/ HbA1c 6.2.  Continue to hold home dose metformin and linagliptin.  Continue on SSI w/ accuchecks   History of breast cancer:  no acute concerns at this time  Post-surgical hypothyroidism:  continue levothyroxine  Hypertension: continue losartan.  4/4 started hydralazine 50 mg p.o. 3 times daily and continue hydralazine 25 mg q6 prn  4/4 DC'd amlodipine due to LE edema, but patient got a.m. dose 4/4 Resumed home dose torsemide 20 mg p.o. daily   Hypotension: resolved  Lower extremity edema Venous duplex: No evidence of deep venous thrombosis in either lower extremity. Mildly complex Baker's cyst in the left popliteal fossa. 4/4 Discontinued amlodipine due to lower extremity edema 4/4 Resumed home dose torsemide 20 mg p.o. daily  DVT prophylaxis: Lovenox Code Status: Full Family Communication: None today Disposition Plan: Unclear at this time.  Medically stable for discharge however there is not a safe disposition plan.  Psychiatry had signed off several days ago stating that patient does not meet criteria for inpatient psych facility.  Physical therapy recommends skilled nursing facility.  Case management is evaluating for placement.  [facility] will accept the patient does not wish to return there.  Per case management the patient endorsed inappropriate behavior involving a female staff member at [facility] and does not wish to return.   Consultants:   Psychiatry  Procedures:   None  Antimicrobials:   None   Subjective: Patient  seen and examined at bedside, no overnight issues, patient was complaining of bilateral knee pain which is due to chronic osteoarthritis.  Patient has underlying dementia AAO x1-2, patient does not want to go back to her facility.   Objective: Vitals:   11/19/19 1631 11/19/19 2336 11/20/19 0744 11/20/19 0821  BP: (!) 153/78 (!) 113/48 (!) 147/72 134/78  Pulse: 63 (!) 54 (!) 53 (!) 56  Resp:  16 18 19   Temp: 98.7 F (37.1 C) 97.8 F (36.6 C)  98 F (36.7 C)  TempSrc: Oral Oral  Oral  SpO2: 98% 99%  99%  Weight:      Height:        Intake/Output Summary (Last 24 hours) at 11/20/2019 1330 Last data filed at 11/20/2019 0930 Gross per 24 hour  Intake 0 ml  Output --  Net 0 ml   Filed Weights   11/15/19 1118 11/16/19 0514 11/18/19 2303  Weight: 103.6 kg 102.8 kg 102.4 kg    Examination:  General exam: Appears calm and comfortable Respiratory system: Clear to auscultation. No wheezes, rhonchi. B/L LE edema Cardiovascular system: S1 & S2 +. No rubs, gallops or clicks.  Gastrointestinal system: Abdomen is obese, soft and nontender. Normal bowel sounds heard. Central nervous system: Alert and oriented. Moves all 4 extremities  Psychiatry: Flat mood and affect   Data Reviewed: I have personally reviewed following labs and imaging studies  CBC: Recent Labs  Lab 11/14/19 0629 11/15/19 0516 11/16/19 0459 11/17/19 0543  WBC 4.7 6.1 4.7 4.9  HGB 12.4 12.1 11.9* 11.7*  HCT 37.4 37.2 36.3 36.2  MCV 84.8 84.5 84.4 85.2  PLT 252 257 249 [id number]   Basic Metabolic Panel: Recent Labs  Lab 11/14/19 0629 11/15/19 0516 11/16/19 0459 11/17/19 0543  NA 140 140 142 140  K 3.8 4.0 4.0 3.7  CL 109 107 110 108  CO2 25 24 25 24   GLUCOSE 134* 105* 108* 116*  BUN 20 26* 28* 26*  CREATININE 0.88 0.91 0.99 0.95  CALCIUM 9.7 9.6 9.3 9.4   GFR: Estimated Creatinine Clearance: 62 mL/min (by C-G formula based on SCr of 0.95 mg/dL). Liver Function Tests: No results for input(s): AST, ALT,  ALKPHOS, BILITOT, PROT, ALBUMIN in the last 168 hours. No results for input(s): LIPASE, AMYLASE in the last 168 hours. No results for input(s): AMMONIA in the last 168 hours. Coagulation Profile: No results for input(s): INR, PROTIME in the last 168 hours. Cardiac Enzymes: No results for input(s): CKTOTAL, CKMB, CKMBINDEX, TROPONINI in the last 168 hours. BNP (last 3 results) No results for input(s): PROBNP in the last 8760 hours. HbA1C: No results for input(s): HGBA1C in the last 72 hours. CBG: Recent Labs  Lab 11/19/19 1140 11/19/19 1705 11/19/19 2120 11/20/19 0735 11/20/19 1148  GLUCAP 120* 82 121* 91 96   Lipid Profile: No results for input(s): CHOL, HDL, LDLCALC, TRIG, CHOLHDL, LDLDIRECT in the last 72 hours. Thyroid Function Tests: No results for input(s): TSH, T4TOTAL, FREET4, T3FREE, THYROIDAB in the last 72 hours. Anemia Panel: No results for input(s): VITAMINB12, FOLATE, FERRITIN, TIBC, IRON, RETICCTPCT in the last 72 hours. Sepsis Labs: No results for input(s): PROCALCITON, LATICACIDVEN in the last 168 hours.  No results found for this or any previous visit (from the past 240 hour(s)).       Radiology Studies: US Venous Img Lower Bilateral (DVT)  Result Date: 11/19/2019 CLINICAL DATA:  79 year old female with bilateral lower extremity edema. EXAM: BILATERAL LOWER EXTREMITY VENOUS DOPPLER ULTRASOUND TECHNIQUE: Gray-scale sonography with graded compression, as well as color Doppler and duplex ultrasound were performed to evaluate the lower extremity deep venous systems from the level of the common femoral vein and including the common femoral, femoral, profunda femoral, popliteal and calf veins including the posterior tibial, peroneal and gastrocnemius veins when visible. The superficial great saphenous vein was also interrogated. Spectral Doppler was utilized to evaluate flow at rest and with distal augmentation maneuvers in the common femoral, femoral and popliteal  veins. COMPARISON:  None. FINDINGS: RIGHT LOWER EXTREMITY Common Femoral Vein: No evidence of thrombus. Normal compressibility, respiratory phasicity and response to augmentation. Saphenofemoral Junction: No evidence of thrombus. Normal compressibility and flow on color Doppler imaging. Profunda Femoral Vein: No evidence of thrombus. Normal compressibility and flow on color Doppler imaging. Femoral Vein: No evidence of thrombus. Normal compressibility, respiratory phasicity and response to augmentation. Popliteal Vein: No evidence of thrombus. Normal compressibility, respiratory phasicity and response to augmentation. Calf Veins: No evidence of thrombus. Normal compressibility and flow on color Doppler imaging. Superficial Great Saphenous Vein: No evidence of thrombus. Normal compressibility. Venous Reflux:  None. Other Findings:  None. LEFT LOWER EXTREMITY Common Femoral Vein: No evidence of thrombus. Normal compressibility, respiratory phasicity and response to augmentation. Saphenofemoral Junction: No evidence of thrombus. Normal compressibility and flow on color Doppler imaging. Profunda Femoral Vein: No evidence of thrombus. Normal compressibility and flow on color Doppler imaging. Femoral Vein: No evidence of thrombus. Normal compressibility, respiratory phasicity and response to augmentation. Popliteal  Vein: No evidence of thrombus. Normal compressibility, respiratory phasicity and response to augmentation. Calf Veins: No evidence of thrombus. Normal compressibility and flow on color Doppler imaging. Superficial Great Saphenous Vein: No evidence of thrombus. Normal compressibility. Venous Reflux:  None. Other Findings: Mildly complex hypoechoic collection with irregular margins in the popliteal fossa measures 5.0 x 2.0 x 3.0 cm. IMPRESSION: No evidence of deep venous thrombosis in either lower extremity. Mildly complex Baker's cyst in the left popliteal fossa. Electronically Signed   By: Jacqulynn Cadet  M.D.   On: 11/19/2019 15:49        Scheduled Meds: . aspirin EC  81 mg Oral Daily  . atorvastatin  40 mg Oral QHS  . enoxaparin (LOVENOX) injection  40 mg Subcutaneous Q24H  . hydrALAZINE  50 mg Oral Q8H  . insulin aspart  0-20 Units Subcutaneous TID WC  . insulin aspart  0-5 Units Subcutaneous QHS  . lamoTRIgine  200 mg Oral Daily  . levothyroxine  200 mcg Oral Q0600  . losartan  50 mg Oral Daily  . pantoprazole  40 mg Oral Daily  . QUEtiapine  25 mg Oral BID  . sertraline  50 mg Oral Daily  . torsemide  20 mg Oral Daily   Continuous Infusions:   LOS: 12 days    Time spent: 25 minutes    Val Riles, MD [facility] Pager [phone]  If 7PM-7AM, please contact night-coverage 11/20/2019, 1:30 PM

## 2019-11-21 LAB — GLUCOSE, CAPILLARY
Glucose-Capillary: 118 mg/dL — ABNORMAL HIGH (ref 70–99)
Glucose-Capillary: 122 mg/dL — ABNORMAL HIGH (ref 70–99)
Glucose-Capillary: 100 mg/dL — ABNORMAL HIGH (ref 70–99)
Glucose-Capillary: 84 mg/dL (ref 70–99)

## 2019-11-21 LAB — CBC
HCT: 35.8 % — ABNORMAL LOW (ref 36.0–46.0)
Hemoglobin: 11.3 g/dL — ABNORMAL LOW (ref 12.0–15.0)
MCH: 27.2 pg (ref 26.0–34.0)
MCHC: 31.6 g/dL (ref 30.0–36.0)
MCV: 86.1 fL (ref 80.0–100.0)
Platelets: 273 10*3/uL (ref 150–400)
RBC: 4.16 MIL/uL (ref 3.87–5.11)
RDW: 15.3 % (ref 11.5–15.5)
WBC: 4.8 10*3/uL (ref 4.0–10.5)
nRBC: 0 % (ref 0.0–0.2)

## 2019-11-21 LAB — BASIC METABOLIC PANEL
Anion gap: 7 (ref 5–15)
BUN: 21 mg/dL (ref 8–23)
CO2: 27 mmol/L (ref 22–32)
Calcium: 9.1 mg/dL (ref 8.9–10.3)
Chloride: 109 mmol/L (ref 98–111)
Creatinine, Ser: 0.99 mg/dL (ref 0.44–1.00)
GFR calc Af Amer: >60 mL/min (ref >60)
GFR calc non Af Amer: 55 mL/min — ABNORMAL LOW (ref >60)
Glucose, Bld: 104 mg/dL — ABNORMAL HIGH (ref 70–99)
Potassium: 3.8 mmol/L (ref 3.5–5.1)
Sodium: 143 mmol/L (ref 135–145)

## 2019-11-21 LAB — MAGNESIUM: Magnesium: 2.1 mg/dL (ref 1.7–2.4)

## 2019-11-21 LAB — PHOSPHORUS: Phosphorus: 3.2 mg/dL (ref 2.5–4.6)

## 2019-11-21 NOTE — Progress Notes (Signed)
Physical Therapy Treatment Patient Details Name: Melanie Young MRN: [id number] DOB: 09/24/1940 Today's Date: 11/21/2019    History of Present Illness Patient is a 79 y/o F that returned to ED from facility due to AMS and bradycardia. She has been quite emotional, noted to have been tearful throughout much of the week she has been in ED. Per RN, she has been having difficulty with transfers and ambulation.    PT Comments    Pt refused first attempt this pm to participate stating she was having a bad day and had "a lot of changes to make."  She continued to decline despite encouragement.  After MD visit and encouragement she agreed to participate but was tearful and crying during session but needed little encouragement.  She is able to lateral scoot transfer to/from drop arm recliner with min a x 1 and remained in chair after session.  She stated she primarily uses wheelchair for mobility prior to admit.  She tried to explain how she transferred and it seems like chair was straight in front of her.  She stated she stood and would turn and fall into the chair.  Educated and suggested change in transfer to make it overall safer and to protect her knees.    Original recommendations for SNF but per SWS placement has been challenging. While SNF remains appropriate an  ALF or family care home could be an option for pt if she is given assistance for transfers in and out of chair/commode for safety until mobility improves with HHPT interventions if SNF is not an option for pt.     Follow Up Recommendations  SNF;Other (comment)     Equipment Recommendations  Rolling walker with 5" wheels;Wheelchair (measurements PT);Wheelchair cushion (measurements PT)    Recommendations for Other Services       Precautions / Restrictions Precautions Precautions: Fall Precaution Comments: audible crepitus bilat knees Restrictions Weight Bearing Restrictions: No    Mobility  Bed Mobility Overal bed mobility:  Modified Independent Bed Mobility: Supine to Sit;Sit to Supine     Supine to sit: Supervision Sit to supine: Supervision   General bed mobility comments: uses rail but no assist.  Reports she is getting up/down on her own for meals and to sit EOB  Transfers Overall transfer level: Needs assistance Equipment used: None Transfers: Lateral/Scoot Transfers          Lateral/Scoot Transfers: Min assist General transfer comment: lateral scoot x 3 to drop arm recliner at bedside. with min a x 1  Ambulation/Gait                 Stairs             Wheelchair Mobility    Modified Rankin (Stroke Patients Only)       Balance Overall balance assessment: Needs assistance Sitting-balance support: No upper extremity supported;Feet supported Sitting balance-Leahy Scale: Good     Standing balance support: Bilateral upper extremity supported Standing balance-Leahy Scale: Poor                              Cognition Arousal/Alertness: Awake/alert Behavior During Therapy: Flat affect Overall Cognitive Status: No family/caregiver present to determine baseline cognitive functioning                                 General Comments: cries/wimpers throughout session  Exercises      General Comments        Pertinent Vitals/Pain Pain Assessment: Faces Faces Pain Scale: Hurts even more Pain Location: BLE knee pain Pain Descriptors / Indicators: Aching;Grimacing;Moaning Pain Intervention(s): Limited activity within patient's tolerance;Monitored during session;Repositioned    Home Living                      Prior Function            PT Goals (current goals can now be found in the care plan section) Progress towards PT goals: Progressing toward goals    Frequency    Min 2X/week      PT Plan Current plan remains appropriate;Other (comment)    Co-evaluation              AM-PAC PT "6 Clicks" Mobility    Outcome Measure  Help needed turning from your back to your side while in a flat bed without using bedrails?: None Help needed moving from lying on your back to sitting on the side of a flat bed without using bedrails?: A Little Help needed moving to and from a bed to a chair (including a wheelchair)?: A Little Help needed standing up from a chair using your arms (e.g., wheelchair or bedside chair)?: A Little Help needed to walk in hospital room?: A Lot Help needed climbing 3-5 steps with a railing? : Total 6 Click Score: 16    End of Session Equipment Utilized During Treatment: Gait belt Activity Tolerance: Patient limited by pain;Other (comment) Patient left: in chair;with call bell/phone within reach;with chair alarm set Nurse Communication: Mobility status Pain - Right/Left: Right Pain - part of body: Knee     Time: [id number] PT Time Calculation (min) (ACUTE ONLY): 14 min  Charges:  $Therapeutic Activity: 8-22 mins                    Chesley Noon, PTA 11/21/19, 2:43 PM

## 2019-11-21 NOTE — Care Management Important Message (Signed)
Important Message  Patient Details  Name: Melanie Young MRN: [id number] Date of Birth: October 09, 1940   Medicare Important Message Given:  Yes     [name] A Nandana Krolikowski 11/21/2019, 11:32 AM

## 2019-11-21 NOTE — Progress Notes (Signed)
Ch visited in Pt in response to PG. Pt had requested for prayer. Upon arrival, Pt was sitting at the edge of the bed. Pt was happy to see Ch. Pt started sharing about previous visits with [employer]. Pt was concerned about declining health. Pt reported pain that moved diagonally from one side of the chest to the other. Pt says that she needs counseling; has had a bad conversation with son. Pt shared about 79 year old mom being alive, and sister being able to take care of herself. Pt misses being independent. Pt asked for prayer. Ch prayed with Pt for support, strength and peace. Pt let Pt know about Ch availability and left.

## 2019-11-21 NOTE — Progress Notes (Signed)
[facility] [place] NAME: Melanie Young    MR#:  [id number]  DATE OF BIRTH:  31-Oct-1940  SUBJECTIVE:  Tearful, wants to leave the hospital and make her d/c plans for herself.  REVIEW OF SYSTEMS:   Review of Systems  Constitutional: Negative for chills, fever and weight loss.  HENT: Negative for ear discharge, ear pain and nosebleeds.   Eyes: Negative for blurred vision, pain and discharge.  Respiratory: Negative for sputum production, shortness of breath, wheezing and stridor.   Cardiovascular: Negative for chest pain, palpitations, orthopnea and PND.  Gastrointestinal: Negative for abdominal pain, diarrhea, nausea and vomiting.  Genitourinary: Negative for frequency and urgency.  Musculoskeletal: Negative for back pain and joint pain.  Neurological: Positive for weakness. Negative for sensory change, speech change and focal weakness.  Psychiatric/Behavioral: Negative for depression and hallucinations. The patient is not nervous/anxious.    Tolerating Diet:yes Tolerating PT: SNF for now but OK with HHPT if pt gets help with transfers  DRUG ALLERGIES:  No Known Allergies  VITALS:  Blood pressure 130/60, pulse (!) 57, temperature 98.4 F (36.9 C), temperature source Oral, resp. rate 20, height 5\' 7"  (1.702 m), weight 102.4 kg, SpO2 99 %.  PHYSICAL EXAMINATION:   Physical Exam  GENERAL:  79 y.o.-year-old patient lying in the bed with no acute distress. obese EYES: Pupils equal, round, reactive to light and accommodation. No scleral icterus.   HEENT: Head atraumatic, normocephalic. Oropharynx and nasopharynx clear.  NECK:  Supple, no jugular venous distention. No thyroid enlargement, no tenderness.  LUNGS: Normal breath sounds bilaterally, no wheezing, rales, rhonchi. No use of accessory muscles of respiration.  CARDIOVASCULAR: S1, S2 normal. No murmurs, rubs, or gallops.  ABDOMEN: Soft, nontender, nondistended. Bowel sounds present.  No organomegaly or mass.  EXTREMITIES: No cyanosis, clubbing or edema b/l.    NEUROLOGIC: Cranial nerves II through XII are intact. No focal Motor or sensory deficits b/l.   PSYCHIATRIC:  patient is alert and oriented x 2 SKIN: No obvious rash, lesion, or ulcer.   LABORATORY PANEL:  CBC Recent Labs  Lab 11/21/19 0516  WBC 4.8  HGB 11.3*  HCT 35.8*  PLT 273    Chemistries  Recent Labs  Lab 11/21/19 0516  NA 143  K 3.8  CL 109  CO2 27  GLUCOSE 104*  BUN 21  CREATININE 0.99  CALCIUM 9.1  MG 2.1   Cardiac Enzymes No results for input(s): TROPONINI in the last 168 hours. RADIOLOGY:  No results found. ASSESSMENT AND PLAN:  Melanie Young a 79 y.o.femalewith medical history significant forsurgical hypothyroidism, type 2 diabetes, CKD 3, asymptomatic sinus bradycardia, dementia and hypertension, who developed hypotension while being boarded in the emergency room awaiting inpatient psychiatric admission. Patient initially presented to the emergency room on 3/18/2021with chief complaint of aggressive behavior.Blood work was done revealing a creatinine of 2.56, up from 0.98 when she initially arrived to the emergency room on 11/03/2019.  Major depressive disorder: recurrent episode, severe.  -Continue buspar, lamictal and seroquel as started by psych service in the emergency room.  -No longer meets inpatient psych criteria as per psych per Dr cristofano.(11/18/2019) and is deemed competent to make decision per psych.  Dementia with behavioral disturbance  -continue w/ supportive care.  -No longer meets inpatient psych criteria as per psych   CKDIIIa:  -Cr is better than baseline. Creat 0.9 with Cr cl of 60cc/min  Sinus bradycardia:  -chronic sinus bradycardia in the 40s  and 50s, asymptomatic. -Avoid avn blockers or other potential meds to exacerbate it. -Pt evaluated for presyncope related to symptomatic bradycardia back in December. Had an unremarkable  echocardiogram. Pacemaker was not felt necessary at the time. Should follow up with Dr. [name] as an outpatient 1 week after discharge for possible stress test and further evaluation regarding the indications for possible pacemaker placement in the near-future. -Cardio signed off  DM2:  pretty well controlled w/ HbA1c 6.2.  Continue to hold home dose metformin and linagliptin.  Continue on SSI w/ accuchecks   History of breast cancer:  no acute concerns at this time  Post-surgical hypothyroidism:  continue levothyroxine  Hypertension: continue losartan.  4/4 started hydralazine 50 mg p.o. 3 times daily and continue hydralazine 25 mg q6 prn  - Resumed home dose torsemide 20 mg p.o. daily  Hypotension: resolved  Lower extremity edema Venous duplex: No evidence of deep venous thrombosis in either lower extremity. Mildly complex Baker's cyst in the left popliteal fossa. 4/4 Discontinued amlodipine due to lower extremity edema 4/4 Resumed home dose torsemide 20 mg p.o. daily  DVT prophylaxis: Lovenox Code Status: Full Family Communication: None today--left VM for sister Disposition Plan: Unclear at this time.  Medically stable for discharge however there is not a safe disposition plan.  - Psychiatry had signed off several days ago stating that patient does not meet criteria for inpatient psych facility.   -Physical therapy recommends skilled nursing facility. - Case management is evaluating for placement. -  [facility] will accept the patient does not wish to return there.  -so far challenging d/c planning   TOTAL TIME TAKING CARE OF THIS PATIENT: *25* minutes.  >50% time spent on counselling and coordination of care  Note: This dictation was prepared with Dragon dictation along with smaller phrase technology. Any transcriptional errors that result from this process are unintentional.  Melanie Young M.D    [facility]   CC: Primary care physician; System,  Pcp Not InPatient ID: Melanie Young, female   DOB: October 16, 1940, 79 y.o.   MRN: [id number]

## 2019-11-21 NOTE — TOC Progression Note (Signed)
Transition of Care [facility]) - Progression Note    Patient Details  Name: Kolbi Laning MRN: [id number] Date of Birth: [birth year]  Transition of Care [facility]) CM/SW Contact  Shelbie Ammons, RN Phone Number: 11/21/2019, 2:52 PM  Clinical Narrative:   RNCM conferenced with Dr. Posey Pronto re patient placement and issues with same. RNCM attempted to contact patient's sister Joan Mayans and voicemail was left for return call.          Expected Discharge Plan and Services                                                 Social Determinants of Health (SDOH) Interventions    Readmission Risk Interventions No flowsheet data found.

## 2019-11-22 LAB — BASIC METABOLIC PANEL
Anion gap: 11 (ref 5–15)
BUN: 25 mg/dL — ABNORMAL HIGH (ref 8–23)
CO2: 24 mmol/L (ref 22–32)
Calcium: 9.6 mg/dL (ref 8.9–10.3)
Chloride: 107 mmol/L (ref 98–111)
Creatinine, Ser: 1.18 mg/dL — ABNORMAL HIGH (ref 0.44–1.00)
GFR calc Af Amer: 52 mL/min — ABNORMAL LOW (ref >60)
GFR calc non Af Amer: 45 mL/min — ABNORMAL LOW (ref >60)
Glucose, Bld: 120 mg/dL — ABNORMAL HIGH (ref 70–99)
Potassium: 3.7 mmol/L (ref 3.5–5.1)
Sodium: 142 mmol/L (ref 135–145)

## 2019-11-22 LAB — CBC
HCT: 39 % (ref 36.0–46.0)
Hemoglobin: 12.5 g/dL (ref 12.0–15.0)
MCH: 27.5 pg (ref 26.0–34.0)
MCHC: 32.1 g/dL (ref 30.0–36.0)
MCV: 85.7 fL (ref 80.0–100.0)
Platelets: 292 10*3/uL (ref 150–400)
RBC: 4.55 MIL/uL (ref 3.87–5.11)
RDW: 15.2 % (ref 11.5–15.5)
WBC: 5 10*3/uL (ref 4.0–10.5)
nRBC: 0 % (ref 0.0–0.2)

## 2019-11-22 LAB — GLUCOSE, CAPILLARY
Glucose-Capillary: 112 mg/dL — ABNORMAL HIGH (ref 70–99)
Glucose-Capillary: 98 mg/dL (ref 70–99)
Glucose-Capillary: 113 mg/dL — ABNORMAL HIGH (ref 70–99)
Glucose-Capillary: 115 mg/dL — ABNORMAL HIGH (ref 70–99)

## 2019-11-22 MED ORDER — LOSARTAN POTASSIUM 25 MG PO TABS
25.0000 mg | ORAL_TABLET | Freq: Every day | ORAL | Status: DC
  Administered 2019-11-23: 10:00:00 25 mg via ORAL
  Filled 2019-11-22: qty 1

## 2019-11-22 MED ORDER — HYDRALAZINE HCL 50 MG PO TABS
25.0000 mg | ORAL_TABLET | Freq: Three times a day (TID) | ORAL | Status: DC
  Administered 2019-11-22 – 2019-11-23 (×2): 25 mg via ORAL
  Filled 2019-11-22 (×2): qty 1

## 2019-11-22 NOTE — TOC Progression Note (Signed)
Transition of Care [facility]) - Progression Note    Patient Details  Name: Melanie Young MRN: [id number] Date of Birth: [birth year]  Transition of Care [facility]) CM/SW Contact  Shelbie Ammons, RN Phone Number: 11/22/2019, 11:29 AM  Clinical Narrative:   RNCM met with patient at bedside, she is now agreeable to facility placement for short term rehab. Will call facilities previously sent to through hub to follow up.          Expected Discharge Plan and Services                                                 Social Determinants of Health (SDOH) Interventions    Readmission Risk Interventions No flowsheet data found.

## 2019-11-22 NOTE — Progress Notes (Signed)
Patients blood pressure is 90/51 map 63 HR 68, messaged Dr. Serita Grit and asked if she wanted me to hold patients hydralazine, she said yes to hold.

## 2019-11-22 NOTE — Progress Notes (Signed)
[place] [facility] [place] NAME: Melanie Young    MR#:  [id number]  DATE OF BIRTH:  12-05-1940  SUBJECTIVE:  Out in the chair. Ate good lunch. No new complaints  REVIEW OF SYSTEMS:   Review of Systems  Constitutional: Negative for chills, fever and weight loss.  HENT: Negative for ear discharge, ear pain and nosebleeds.   Eyes: Negative for blurred vision, pain and discharge.  Respiratory: Negative for sputum production, shortness of breath, wheezing and stridor.   Cardiovascular: Negative for chest pain, palpitations, orthopnea and PND.  Gastrointestinal: Negative for abdominal pain, diarrhea, nausea and vomiting.  Genitourinary: Negative for frequency and urgency.  Musculoskeletal: Negative for back pain and joint pain.  Neurological: Positive for weakness. Negative for sensory change, speech change and focal weakness.  Psychiatric/Behavioral: Negative for depression and hallucinations. The patient is not nervous/anxious.    Tolerating Diet:yes Tolerating PT: SNF for now but OK with HHPT if pt gets help with transfers  DRUG ALLERGIES:  No Known Allergies  VITALS:  Blood pressure (!) 90/51, pulse 84, temperature 98.2 F (36.8 C), temperature source Oral, resp. rate 18, height 5\' 7"  (1.702 m), weight 102.4 kg, SpO2 99 %.  PHYSICAL EXAMINATION:   Physical Exam  GENERAL:  79 y.o.-year-old patient lying in the bed with no acute distress. Obese EYES: Pupils equal, round, reactive to light and accommodation. No scleral icterus.   HEENT: Head atraumatic, normocephalic. Oropharynx and nasopharynx clear.  NECK:  Supple, no jugular venous distention. No thyroid enlargement, no tenderness.  LUNGS: Normal breath sounds bilaterally, no wheezing, rales, rhonchi. No use of accessory muscles of respiration.  CARDIOVASCULAR: S1, S2 normal. No murmurs, rubs, or gallops.  ABDOMEN: Soft, nontender, nondistended. Bowel sounds present. No organomegaly or  mass.  EXTREMITIES: No cyanosis, clubbing or edema b/l.    NEUROLOGIC: Cranial nerves II through XII are intact. No focal Motor or sensory deficits b/l.   PSYCHIATRIC:  patient is alert and oriented x 2 SKIN: No obvious rash, lesion, or ulcer.   LABORATORY PANEL:  CBC Recent Labs  Lab 11/22/19 0743  WBC 5.0  HGB 12.5  HCT 39.0  PLT 292    Chemistries  Recent Labs  Lab 11/21/19 0516 11/21/19 0516 11/22/19 0743  NA 143   < > 142  K 3.8   < > 3.7  CL 109   < > 107  CO2 27   < > 24  GLUCOSE 104*   < > 120*  BUN 21   < > 25*  CREATININE 0.99   < > 1.18*  CALCIUM 9.1   < > 9.6  MG 2.1  --   --    < > = values in this interval not displayed.   Cardiac Enzymes No results for input(s): TROPONINI in the last 168 hours. RADIOLOGY:  No results found. ASSESSMENT AND PLAN:  Melanie Young a 79 y.o.femalewith medical history significant forsurgical hypothyroidism, type 2 diabetes, CKD 3, asymptomatic sinus bradycardia, dementia and hypertension, who developed hypotension while being boarded in the emergency room awaiting inpatient psychiatric admission. Patient initially presented to the emergency room on 3/18/2021with chief complaint of aggressive behavior.Blood work was done revealing a creatinine of 2.56, up from 0.98 when she initially arrived to the emergency room on 11/03/2019.  Dementia with behavioral disturbance  -continue w/ supportive care.  -No longer meets inpatient psych criteria as per psych   Major depressive disorder: recurrent episode -Continue buspar, lamictal and seroquel  as started by psychiatry in the emergency room.  -No longer meets inpatient psych criteria as per psych per Dr cristofano.(11/18/2019) and is deemed competent to make decision per psych.  CKDIIIa:  -Cr is better than baseline. Creat 0.9 with Cr cl of 60cc/min  Sinus bradycardia:  -chronic sinus bradycardia in the 40s and 50s, asymptomatic. -Avoid AV nodal blockers or other potential  meds to exacerbate it. -Pt evaluated for presyncope related to symptomatic bradycardia back in December. Had an unremarkable echocardiogram. Pacemaker was not felt necessary at the time. Should follow up with Dr. [name] as an outpatient 1 week after discharge for possible stress test and further evaluation regarding the indications for possible pacemaker placement in the near-future. -Cardio signed off  DM2:  pretty well controlled w/ HbA1c 6.2.  Continue to hold home dose metformin and linagliptin.  Continue on SSI w/ accuchecks   History of breast cancer:  no acute concerns at this time  Post-surgical hypothyroidism:  continue levothyroxine  Hypertension: continue losartan.  BP soft for last 2 days -decreased losartan and hydralazine dose - Resumed home dose torsemide 20 mg p.o. daily  Hypotension: resolved  Lower extremity edema Venous duplex: No evidence of deep venous thrombosis in either lower extremity. Mildly complex Baker's cyst in the left popliteal fossa. 4/4 Discontinued amlodipine due to lower extremity edema 4/4 Resumed home dose torsemide 20 mg p.o. daily  DVT prophylaxis: Lovenox Code Status: Full Family Communication: None today--left VM for sister--no call returned Disposition Plan: Unclear at this time.  Medically stable for discharge however there is not a safe disposition plan.  - Psychiatry had signed off several days ago stating that patient does not meet criteria for inpatient psych facility.   -Physical therapy recommends skilled nursing facility. -Per [place] today "RNCM spoke with Vickii Chafe [phone] at [facility] to discuss case. She reports that she will be glad to look over her paperwork and will make a visit for assessment on 4/7 at 8:30 or 9am"  TOTAL TIME TAKING CARE OF THIS PATIENT: *20* minutes.  >50% time spent on counselling and coordination of care  Note: This dictation was prepared with Dragon dictation along with smaller phrase  technology. Any transcriptional errors that result from this process are unintentional.  Melanie Young M.D    [facility]   CC: Primary care physician; System, Pcp Not InPatient ID: Melanie Young, female   DOB: [birth year], 79 y.o.   MRN: [id number]

## 2019-11-22 NOTE — Progress Notes (Addendum)
Physical Therapy Treatment Patient Details Name: Melanie Young MRN: [id number] DOB: Mar 08, 1941 Today's Date: 11/22/2019    History of Present Illness Patient is a 79 y/o F that returned to ED from facility due to AMS and bradycardia. She has been quite emotional, noted to have been tearful throughout much of the week she has been in ED. Per RN, she has been having difficulty with transfers and ambulation.    PT Comments    Pt was seated in recliner upon arriving. She agrees to PT session with encouragement. Continues to report severe knee pain in BLEs. 10/10 in wt bearing. Pt able to stand 3 x from recliner with min assist + vcs for improved technique. She took 10 steps fwd with chair follow + gait belt + use of RW. No LOB however pt crys out in pain several times. After long seated rest, pt then ambulate 10 more steps. Max encouragement and vcs throughout session for motivation. Pt fully participates once motivated. Pt is seated in recliner post session with BLEs elevated, chair alarm in place, and call bell in reach. She overall continues to be limited by knee pain but is progressing towards goals.Therapist recommends DC to SNF/ have 24 hour supervision with mobility.     Follow Up Recommendations  SNF     Equipment Recommendations  Rolling walker with 5" wheels;Wheelchair (measurements PT);Wheelchair cushion (measurements PT)    Recommendations for Other Services       Precautions / Restrictions Precautions Precautions: Fall Precaution Comments: audible crepitus bilat knees Restrictions Weight Bearing Restrictions: No    Mobility  Bed Mobility               General bed mobility comments: pt was seated in recliner pre/post session  Transfers Overall transfer level: Needs assistance Equipment used: Rolling walker (2 wheeled) Transfers: Sit to/from Stand Sit to Stand: Min assist         General transfer comment: Pt required min assist to stand from recliner 3 x  + vcs  for handplacement and technique. pt requires alot of encouragement throughout session for motivation and participation  Ambulation/Gait Ambulation/Gait assistance: +2 safety/equipment;Min assist Gait Distance (Feet): 10 Feet Assistive device: Rolling walker (2 wheeled) Gait Pattern/deviations: Trunk flexed;Shuffle;Decreased stance time - left;Decreased stance time - right;Decreased step length - left;Decreased step length - right;Antalgic Gait velocity: decreased   General Gait Details: pt was able to ambulate 2 x 10 ft with chair follow and poor posture/ antalgic pattern. She yells out several time 2/2 to pain. seated rest between trial   Stairs             Wheelchair Mobility    Modified Rankin (Stroke Patients Only)       Balance Overall balance assessment: Needs assistance Sitting-balance support: No upper extremity supported;Feet supported Sitting balance-Leahy Scale: Good Sitting balance - Comments: No LOB seated reaching outside BOS   Standing balance support: Bilateral upper extremity supported Standing balance-Leahy Scale: Poor Standing balance comment: severe kyphotic posture but was able to take steps this date with max cueing/encouragement                            Cognition Arousal/Alertness: Awake/alert Behavior During Therapy: Flat affect Overall Cognitive Status: History of cognitive impairments - at baseline Area of Impairment: Safety/judgement;Problem solving;Awareness                         Safety/Judgement: Decreased  awareness of safety;Decreased awareness of deficits Awareness: Intellectual Problem Solving: Requires tactile cues;Requires verbal cues;Difficulty sequencing;Decreased initiation;Slow processing General Comments: cries/wimpers throughout session when in wt bearing 2/2 to pain.      Exercises      General Comments        Pertinent Vitals/Pain Pain Assessment: 0-10 Pain Score: 10-Worst pain ever Faces  Pain Scale: Hurts worst Pain Location: BLE knee pain Pain Descriptors / Indicators: Aching;Grimacing;Moaning Pain Intervention(s): Limited activity within patient's tolerance;Monitored during session    Home Living                      Prior Function            PT Goals (current goals can now be found in the care plan section) Acute Rehab PT Goals Patient Stated Goal: " I just want to be able to not hurt" Progress towards PT goals: Progressing toward goals    Frequency    Min 2X/week      PT Plan Current plan remains appropriate;Other (comment)    Co-evaluation              AM-PAC PT "6 Clicks" Mobility   Outcome Measure  Help needed turning from your back to your side while in a flat bed without using bedrails?: None Help needed moving from lying on your back to sitting on the side of a flat bed without using bedrails?: A Little Help needed moving to and from a bed to a chair (including a wheelchair)?: A Little Help needed standing up from a chair using your arms (e.g., wheelchair or bedside chair)?: A Little Help needed to walk in hospital room?: A Lot Help needed climbing 3-5 steps with a railing? : Total 6 Click Score: 16    End of Session Equipment Utilized During Treatment: Gait belt Activity Tolerance: Patient limited by pain Patient left: in chair;with call bell/phone within reach;with chair alarm set Nurse Communication: Mobility status PT Visit Diagnosis: Difficulty in walking, not elsewhere classified (R26.2);History of falling (Z91.81);Pain Pain - Right/Left: Right Pain - part of body: Knee     Time: 1524-1540 PT Time Calculation (min) (ACUTE ONLY): 16 min  Charges:  $Therapeutic Activity: 8-22 mins                     Julaine Fusi PTA 11/22/19, 4:30 PM

## 2019-11-22 NOTE — TOC Progression Note (Signed)
Transition of Care [facility]) - Progression Note    Patient Details  Name: Melanie Young MRN: [id number] Date of Birth: 1941/04/29  Transition of Care [facility]) CM/SW Contact  Shelbie Ammons, RN Phone Number: 11/22/2019, 1:02 PM  Clinical Narrative:   RNCM spoke with Vickii Chafe [phone] at [facility] to discuss case. She reports that she will be glad to look over her paperwork and will make a visit for assessment on 4/7 at 8:30 or 9am. Did give her this CMs number so she could call when she is on her way. Clinical information faxed to [phone].          Expected Discharge Plan and Services                                                 Social Determinants of Health (SDOH) Interventions    Readmission Risk Interventions No flowsheet data found.

## 2019-11-23 DIAGNOSIS — N1831 Chronic kidney disease, stage 3a: Secondary | ICD-10-CM

## 2019-11-23 LAB — GLUCOSE, CAPILLARY
Glucose-Capillary: 104 mg/dL — ABNORMAL HIGH (ref 70–99)
Glucose-Capillary: 144 mg/dL — ABNORMAL HIGH (ref 70–99)

## 2019-11-23 LAB — RESPIRATORY PANEL BY RT PCR (FLU A&B, COVID)
Influenza A by PCR: NEGATIVE
Influenza B by PCR: NEGATIVE
SARS Coronavirus 2 by RT PCR: NEGATIVE

## 2019-11-23 MED ORDER — LEVOTHYROXINE SODIUM 200 MCG PO TABS: 200.0000 ug | ORAL_TABLET | Freq: Every day | ORAL | 1 refills | Status: DC

## 2019-11-23 MED ORDER — INSULIN ASPART 100 UNIT/ML ~~LOC~~ SOLN: 0.0000 [IU] | Freq: Two times a day (BID) | SUBCUTANEOUS | Status: DC

## 2019-11-23 MED ORDER — SERTRALINE HCL 50 MG PO TABS: 50.0000 mg | ORAL_TABLET | Freq: Every day | ORAL | 0 refills | Status: DC

## 2019-11-23 MED ORDER — OXYCODONE HCL 5 MG PO TABS: 5.0000 mg | ORAL_TABLET | Freq: Two times a day (BID) | ORAL | 0 refills | Status: DC

## 2019-11-23 MED ORDER — QUETIAPINE FUMARATE 25 MG PO TABS: 25.0000 mg | ORAL_TABLET | Freq: Two times a day (BID) | ORAL | 0 refills | Status: DC

## 2019-11-23 NOTE — NC FL2 (Signed)
[place] LEVEL OF CARE SCREENING TOOL     IDENTIFICATION  Patient Name: Melanie Young Birthdate: 04/21/1941 Sex: female Admission Date (Current Location): 11/03/2019  [place] and [place] Number:  Engineering geologist and Address:  [facility], [address] [place], [place] [address]      Provider Number: [id number]  Attending Physician Name and Address:  Fritzi Mandes, MD  Relative Name and Phone Number:       Current Level of Care: Hospital Recommended Level of Care: [place] Prior Approval Number:    Date Approved/Denied:   PASRR Number: [id number]  Discharge Plan: SNF    Current Diagnoses: Patient Active Problem List   Diagnosis Date Noted  . Major depressive disorder, recurrent episode, moderate ([place]) 11/12/2019  . AKI (acute kidney injury) ([place]) 11/08/2019  . Dementia with behavioral disturbance ([place]) 11/06/2019  . Type 2 diabetes mellitus with hyperlipidemia ([place]) 11/06/2019  . History of breast cancer 11/06/2019  . Hypotension 11/06/2019  . Essential hypertension 11/06/2019  . Post-surgical hypothyroidism 11/06/2019  . CKD (chronic kidney disease) stage 3, GFR 30-59 ml/min 11/06/2019  . Sinus bradycardia 09/10/2019  . Fall   . Dizziness   . Urinary tract infection in elderly patient   . Acute kidney injury ([place]) 08/10/2017    Orientation RESPIRATION BLADDER Height & Weight     Self, Time, Place  Normal Continent Weight: 102.4 kg Height:  5\' 7"  (170.2 cm)  BEHAVIORAL SYMPTOMS/MOOD NEUROLOGICAL BOWEL NUTRITION STATUS      Continent Diet  AMBULATORY STATUS COMMUNICATION OF NEEDS Skin   Extensive Assist Verbally Normal                       Personal Care Assistance Level of Assistance  Dressing, Feeding, Bathing Bathing Assistance: Limited assistance Feeding assistance: Limited assistance Dressing Assistance: Limited assistance     Functional Limitations Info  Sight, Hearing, Speech  Sight Info: Adequate Hearing Info: Adequate Speech Info: Adequate    SPECIAL CARE FACTORS FREQUENCY  PT (By licensed PT), OT (By licensed OT)                    Contractures Contractures Info: Not present    Additional Factors Info  Code Status Code Status Info: Full             Current Medications (11/23/2019):  This is the current hospital active medication list Current Facility-Administered Medications  Medication Dose Route Frequency Provider Last Rate Last Admin  . acetaminophen (TYLENOL) suppository 650 mg  650 mg Rectal Q6H PRN Oswald Hillock, RPH       Or  . acetaminophen (TYLENOL) tablet 650 mg  650 mg Oral Q6H PRN Oswald Hillock, RPH   650 mg at 11/20/19 1123  . aspirin EC tablet 81 mg  81 mg Oral Daily Athena Masse, MD   81 mg at 11/23/19 0950  . atorvastatin (LIPITOR) tablet 40 mg  40 mg Oral QHS Athena Masse, MD   40 mg at 11/22/19 2217  . diclofenac Sodium (VOLTAREN) 1 % topical gel 2 g  2 g Topical BID PRN Athena Masse, MD   2 g at 11/17/19 0116  . enoxaparin (LOVENOX) injection 40 mg  40 mg Subcutaneous Q24H Dallie Piles, RPH   40 mg at 11/22/19 2216  . haloperidol (HALDOL) tablet 2 mg  2 mg Oral Q8H PRN Sidney Ace, MD  Or  . haloperidol lactate (HALDOL) injection 2 mg  2 mg Intramuscular Q8H PRN Sreenath, Sudheer B, MD      . hydrALAZINE (APRESOLINE) tablet 25 mg  25 mg Oral Q6H PRN Wyvonnia Dusky, MD   25 mg at 11/15/19 1719  . hydrALAZINE (APRESOLINE) tablet 25 mg  25 mg Oral Q8H Fritzi Mandes, MD   25 mg at 11/23/19 [id number]  . insulin aspart (novoLOG) injection 0-20 Units  0-20 Units Subcutaneous BID Fritzi Mandes, MD      . insulin aspart (novoLOG) injection 0-5 Units  0-5 Units Subcutaneous QHS Judd Gaudier V, MD      . lamoTRIgine (LAMICTAL) tablet 200 mg  200 mg Oral Daily Athena Masse, MD   200 mg at 11/23/19 0951  . levothyroxine (SYNTHROID) tablet 200 mcg  200 mcg Oral Q0600 Athena Masse, MD   200 mcg at 11/23/19  [id number]  . losartan (COZAAR) tablet 25 mg  25 mg Oral Daily Fritzi Mandes, MD   25 mg at 11/23/19 0951  . ondansetron (ZOFRAN) tablet 4 mg  4 mg Oral Q6H PRN Athena Masse, MD       Or  . ondansetron [facility]) injection 4 mg  4 mg Intravenous Q6H PRN Athena Masse, MD      . oxyCODONE (Oxy IR/ROXICODONE) immediate release tablet 5 mg  5 mg Oral Q6H PRN Lang Snow, NP   5 mg at 11/23/19 0952  . pantoprazole (PROTONIX) EC tablet 40 mg  40 mg Oral Daily Athena Masse, MD   40 mg at 11/23/19 0951  . QUEtiapine (SEROQUEL) tablet 25 mg  25 mg Oral BID Athena Masse, MD   25 mg at 11/23/19 0951  . senna-docusate (Senokot-S) tablet 1 tablet  1 tablet Oral QHS PRN Athena Masse, MD   1 tablet at 11/21/19 [phone]  . sertraline (ZOLOFT) tablet 50 mg  50 mg Oral Daily Cristofano, Dorene Ar, MD   50 mg at 11/23/19 0951  . torsemide (DEMADEX) tablet 20 mg  20 mg Oral Daily Val Riles, MD   20 mg at 11/23/19 [id number]     Discharge Medications: Please see discharge summary for a list of discharge medications.  Relevant Imaging Results:  Relevant Lab Results:   Additional Information SS# [id number]  Shelbie Ammons, RN

## 2019-11-23 NOTE — TOC Progression Note (Signed)
Transition of Care [facility]) - Progression Note    Patient Details  Name: Melanie Young MRN: [id number] Date of Birth: Dec 25, 1940  [facility]) CM/SW Contact  Shelbie Ammons, RN Phone Number: 11/23/2019, 1:16 PM  Clinical Narrative:   RNCM met with patient at bedside along with Peggy from [facility] who had come to evaluate patient. Made introductions and discussed that Vickii Chafe was here to talk to her about going to rehab at their facility. Patient was agreeable to speak with Peggy and after a few minutes CM left to allow them time to talk. Did receive call from [place] that patient is agreeable to go to her facility as long as that can happen today. Discussed with Vickii Chafe about whether this could happen today or not. After discussion and communication with MD did fax all requested information to [place] at facility.          Expected Discharge Plan and Services           Expected Discharge Date: 11/23/19                                     Social Determinants of Health (SDOH) Interventions    Readmission Risk Interventions No flowsheet data found.

## 2019-11-23 NOTE — NC FL2 (Addendum)
[place] LEVEL OF CARE SCREENING TOOL     IDENTIFICATION  Patient Name: Melanie Young Birthdate: 1941-04-03 Sex: female Admission Date (Current Location): 11/03/2019  [place] and [place] Number:  Engineering geologist and Address:  [facility], [address] [place], [place] [address]      Provider Number: [id number]  Attending Physician Name and Address:  Fritzi Mandes, MD  Relative Name and Phone Number:       Current Level of Care: Hospital Recommended Level of Care: Memory Care, [place] Prior Approval Number:    Date Approved/Denied:   PASRR Number: [id number]  Discharge Plan: Other (Comment)(Memory Care unit)    Current Diagnoses: Patient Active Problem List   Diagnosis Date Noted  . Major depressive disorder, recurrent episode, moderate ([place]) 11/12/2019  . AKI (acute kidney injury) ([place]) 11/08/2019  . Dementia with behavioral disturbance ([place]) 11/06/2019  . Type 2 diabetes mellitus with hyperlipidemia ([place]) 11/06/2019  . History of breast cancer 11/06/2019  . Hypotension 11/06/2019  . Essential hypertension 11/06/2019  . Post-surgical hypothyroidism 11/06/2019  . CKD (chronic kidney disease) stage 3, GFR 30-59 ml/min 11/06/2019  . Sinus bradycardia 09/10/2019  . Fall   . Dizziness   . Urinary tract infection in elderly patient   . Acute kidney injury ([place]) 08/10/2017    Orientation RESPIRATION BLADDER Height & Weight     Self, Time, Place  Normal Continent Weight: 102.4 kg Height:  5\' 7"  (170.2 cm)  BEHAVIORAL SYMPTOMS/MOOD NEUROLOGICAL BOWEL NUTRITION STATUS      Continent Diet(Heart Healthy, Carb modified)  AMBULATORY STATUS COMMUNICATION OF NEEDS Skin   Extensive Assist Verbally Normal                       Personal Care Assistance Level of Assistance  Bathing, Feeding, Dressing Bathing Assistance: Limited assistance Feeding assistance: Limited assistance Dressing Assistance: Limited  assistance     Functional Limitations Info  Sight, Hearing, Speech Sight Info: Adequate Hearing Info: Adequate Speech Info: Adequate    SPECIAL CARE FACTORS FREQUENCY  PT (By licensed PT), OT (By licensed OT)                    Contractures Contractures Info: Not present    Additional Factors Info  Code Status Code Status Info: Full             Medication List    STOP taking these medications   ammonium lactate 12 % lotion Commonly known as: LAC-HYDRIN   busPIRone 10 MG tablet Commonly known as: BUSPAR   linagliptin 5 MG Tabs tablet Commonly known as: TRADJENTA   metFORMIN 500 MG tablet Commonly known as: GLUCOPHAGE   nystatin powder Generic drug: nystatin     TAKE these medications   acetaminophen 325 MG tablet Commonly known as: TYLENOL Take 650 mg by mouth every 4 (four) hours as needed for mild pain.   acetaminophen 650 MG CR tablet Commonly known as: TYLENOL Take 650 mg by mouth 3 (three) times daily.   alum & mag hydroxide-simeth 200-200-20 MG/5ML suspension Commonly known as: MAALOX/MYLANTA Take 30 mLs by mouth every 6 (six) hours as needed for indigestion or heartburn.   aspirin EC 81 MG tablet Take 1 tablet (81 mg total) by mouth daily.   atorvastatin 40 MG tablet Commonly known as: LIPITOR Take 40 mg by mouth at bedtime.   calcium carbonate 500 MG chewable tablet Commonly known as: TUMS - dosed  in mg elemental calcium Chew 1 tablet by mouth 2 (two) times daily.   cholecalciferol 25 MCG (1000 UNIT) tablet Commonly known as: VITAMIN D3 Take 1,000 Units by mouth daily.   diclofenac Sodium 1 % Gel Commonly known as: VOLTAREN Apply 2 g topically 2 (two) times daily.   guaifenesin 100 MG/5ML syrup Commonly known as: ROBITUSSIN Take 200 mg by mouth every 6 (six) hours as needed for cough.   lamoTRIgine 200 MG tablet Commonly known as: LAMICTAL Take 200 mg by mouth daily.   levothyroxine 200 MCG  tablet Commonly known as: SYNTHROID Take 1 tablet (200 mcg total) by mouth daily at 6 (six) AM. Start taking on: November 24, 2019 What changed:   medication strength  how much to take  when to take this   loperamide 2 MG capsule Commonly known as: IMODIUM Take 2 mg by mouth 4 (four) times daily as needed for diarrhea or loose stools.   losartan 50 MG tablet Commonly known as: COZAAR Take 50 mg by mouth daily.   Lyrica 100 MG capsule Generic drug: pregabalin Take 100 mg by mouth 2 (two) times daily.   magnesium hydroxide 400 MG/5ML suspension Commonly known as: MILK OF MAGNESIA Take 30 mLs by mouth at bedtime as needed for mild constipation or moderate constipation.   neomycin-bacitracin-polymyxin ointment Commonly known as: NEOSPORIN Apply 1 application topically as needed for wound care.   omeprazole 20 MG capsule Commonly known as: PRILOSEC Take 20 mg by mouth daily.   ondansetron 4 MG tablet Commonly known as: ZOFRAN Take 4 mg by mouth every 6 (six) hours as needed for nausea or vomiting.   oxybutynin 5 MG tablet Commonly known as: DITROPAN Take 2.5 mg by mouth 2 (two) times daily.   oxyCODONE 5 MG immediate release tablet Commonly known as: Oxy IR/ROXICODONE Take 1 tablet (5 mg total) by mouth 2 (two) times daily.   ProAir HFA 108 (90 Base) MCG/ACT inhaler Generic drug: albuterol Inhale 1 puff into the lungs every 6 (six) hours as needed for wheezing or shortness of breath.   QUEtiapine 25 MG tablet Commonly known as: SEROQUEL Take 1 tablet (25 mg total) by mouth 2 (two) times daily for 30 doses. What changed:   medication strength  when to take this  Another medication with the same name was removed. Continue taking this medication, and follow the directions you see here.   sertraline 50 MG tablet Commonly known as: ZOLOFT Take 1 tablet (50 mg total) by mouth daily. Start taking on: November 24, 2019   torsemide 20 MG tablet Commonly  known as: DEMADEX Take 20 mg by mouth daily.   trolamine salicylate 10 % cream Commonly known as: ASPERCREME Apply 1 application topically 3 (three) times daily as needed for muscle pain.       Discharge Medications: Please see discharge summary for a list of discharge medications.  Relevant Imaging Results:  Relevant Lab Results:   Additional Information SS# [id number]  Shelbie Ammons, RN

## 2019-11-23 NOTE — Discharge Instructions (Signed)
Facility to check sugars atleast 2 times a day

## 2019-11-23 NOTE — Care Management Important Message (Signed)
Important Message  Patient Details  Name: Melanie Young MRN: [id number] Date of Birth: 14-Mar-1941   Medicare Important Message Given:  Yes     [name] A Valene Villa 11/23/2019, 10:24 AM

## 2019-11-23 NOTE — Progress Notes (Signed)
Patients blood pressure is 115/51 hr 60 and she has a hydralazine due, messaged Dr. Serita Grit and asked if she wanted me to give it or hold it, and she told me to hold it.

## 2019-11-23 NOTE — Discharge Summary (Addendum)
[facility] [place] NAME: Melanie Young    MR#:  [id number]  DATE OF BIRTH:  03-12-1941  DATE OF ADMISSION:  11/03/2019 ADMITTING PHYSICIAN: Athena Masse, MD  DATE OF DISCHARGE: 11/23/2019  PRIMARY CARE PHYSICIAN: System, Pcp Not In    ADMISSION DIAGNOSIS:  Thrombocytopenia ([place]) [D69.6] Atypical chest pain [R07.89] AKI (acute kidney injury) ([place]) [N17.9] Acute kidney injury ([place]) [N17.9] Hypotension, unspecified hypotension type [I95.9] Dementia with behavioral disturbance, unspecified dementia type ([place]) [F03.91]  DISCHARGE DIAGNOSIS:  hypotension with acute on chronic renal failure with dehydration improved CKD-IIIa H/o Major depression Dementia with intermittent behavioral disturbance DM-2--not on any meds, diet controlled Obesity SECONDARY DIAGNOSIS:   Past Medical History:  Diagnosis Date  . Breast cancer ([place]) 1980's   right breast ca with mastectomy  . Chronic kidney disease   . Dementia ([place])   . Depression   . Diabetes mellitus without complication ([place])   . Hypercholesteremia   . Hypertension   . Hypothyroidism   . Osteoporosis     HOSPITAL COURSE:  Melanie Young a 79 y.o.femalewith medical history significant forsurgical hypothyroidism, type 2 diabetes, CKD 3, asymptomatic sinus bradycardia, dementia and hypertension, who developed hypotension while being boarded in the emergency room awaiting inpatient psychiatric admission. Patient initially presented to the emergency room on 3/18/2021with chief complaint of aggressive behavior.Blood work was done revealing a creatinine of 2.56, up from 0.98 when she initially arrived to the emergency room on 11/03/2019.  Dementia with behavioral disturbance -continue w/ supportive care.  -No longer meets inpatient psych criteria as per psych   Major depressive disorder: recurrent episode -Continue zoloft, lamictal and seroquel as started by psychiatry in the  emergency room.  -No longer meets inpatient psych criteria as per psych per Dr cristofano.(11/18/2019) and is deemed competent to make decision per psych.  Acute on CKDIIIa:--with hypotension due to dehydration--resolved -Cr is better than baseline. Creat 0.9 with Cr cl of 60cc/min  Sinus bradycardia: -HR 60-70's -Avoid AV nodal blockers or other potential meds to exacerbate it. -Pt evaluated for presyncope related to symptomatic bradycardia back in December. Had an unremarkable echocardiogram. Pacemaker was not felt necessary at the time. Should follow up with Dr. [name] as an outpatient after discharge for possible stress test and further evaluation regarding the indications for possible pacemaker placement in the near-future. -Cardio signed off  DM2 with CKD-IIIA pretty well controlled w/ HbA1c 6.2.  Continue to hold home dose metformin and linagliptin.  Continue on SSI w/ accuchecks  -sugar check bid at the ALF  History of breast cancer: no acute concerns at this time -sees Dr Tasia Catchings  Post-surgical hypothyroidism: continue levothyroxine  Hypertension: continue losartan and low dose torsemide. D/c hydralazine   Obesity  Lower extremity edema Venous duplex: No evidence of deep venous thrombosis in either lower extremity. Mildly complex Baker's cyst in the left popliteal fossa. -Resumed home dose torsemide 20 mg p.o. daily  DVT prophylaxis:Lovenox Code Status:Full Family Communication:None today--left VM for sister--no call returned Disposition Plan:[facility]  Addendum: repeat COVID negative CONSULTS OBTAINED:  Treatment Team:  Yolonda Kida, MD Dixie Dials, MD  DRUG ALLERGIES:  No Known Allergies  DISCHARGE MEDICATIONS:   Allergies as of 11/23/2019   No Known Allergies     Medication List    STOP taking these medications   ammonium lactate 12 % lotion Commonly known as: LAC-HYDRIN   busPIRone 10 MG tablet Commonly  known as:  BUSPAR   linagliptin 5 MG Tabs tablet Commonly known as: TRADJENTA   metFORMIN 500 MG tablet Commonly known as: GLUCOPHAGE   nystatin powder Generic drug: nystatin     TAKE these medications   acetaminophen 325 MG tablet Commonly known as: TYLENOL Take 650 mg by mouth every 4 (four) hours as needed for mild pain.   acetaminophen 650 MG CR tablet Commonly known as: TYLENOL Take 650 mg by mouth 3 (three) times daily.   alum & mag hydroxide-simeth 200-200-20 MG/5ML suspension Commonly known as: MAALOX/MYLANTA Take 30 mLs by mouth every 6 (six) hours as needed for indigestion or heartburn.   aspirin EC 81 MG tablet Take 1 tablet (81 mg total) by mouth daily.   atorvastatin 40 MG tablet Commonly known as: LIPITOR Take 40 mg by mouth at bedtime.   calcium carbonate 500 MG chewable tablet Commonly known as: TUMS - dosed in mg elemental calcium Chew 1 tablet by mouth 2 (two) times daily.   cholecalciferol 25 MCG (1000 UNIT) tablet Commonly known as: VITAMIN D3 Take 1,000 Units by mouth daily.   diclofenac Sodium 1 % Gel Commonly known as: VOLTAREN Apply 2 g topically 2 (two) times daily.   guaifenesin 100 MG/5ML syrup Commonly known as: ROBITUSSIN Take 200 mg by mouth every 6 (six) hours as needed for cough.   lamoTRIgine 200 MG tablet Commonly known as: LAMICTAL Take 200 mg by mouth daily.   levothyroxine 200 MCG tablet Commonly known as: SYNTHROID Take 1 tablet (200 mcg total) by mouth daily at 6 (six) AM. Start taking on: November 24, 2019 What changed:   medication strength  how much to take  when to take this   loperamide 2 MG capsule Commonly known as: IMODIUM Take 2 mg by mouth 4 (four) times daily as needed for diarrhea or loose stools.   losartan 50 MG tablet Commonly known as: COZAAR Take 50 mg by mouth daily.   Lyrica 100 MG capsule Generic drug: pregabalin Take 100 mg by mouth 2 (two) times daily.   magnesium hydroxide 400 MG/5ML  suspension Commonly known as: MILK OF MAGNESIA Take 30 mLs by mouth at bedtime as needed for mild constipation or moderate constipation.   neomycin-bacitracin-polymyxin ointment Commonly known as: NEOSPORIN Apply 1 application topically as needed for wound care.   omeprazole 20 MG capsule Commonly known as: PRILOSEC Take 20 mg by mouth daily.   ondansetron 4 MG tablet Commonly known as: ZOFRAN Take 4 mg by mouth every 6 (six) hours as needed for nausea or vomiting.   oxybutynin 5 MG tablet Commonly known as: DITROPAN Take 2.5 mg by mouth 2 (two) times daily.   oxyCODONE 5 MG immediate release tablet Commonly known as: Oxy IR/ROXICODONE Take 1 tablet (5 mg total) by mouth 2 (two) times daily.   ProAir HFA 108 (90 Base) MCG/ACT inhaler Generic drug: albuterol Inhale 1 puff into the lungs every 6 (six) hours as needed for wheezing or shortness of breath.   QUEtiapine 25 MG tablet Commonly known as: SEROQUEL Take 1 tablet (25 mg total) by mouth 2 (two) times daily for 30 doses. What changed:   medication strength  when to take this  Another medication with the same name was removed. Continue taking this medication, and follow the directions you see here.   sertraline 50 MG tablet Commonly known as: ZOLOFT Take 1 tablet (50 mg total) by mouth daily. Start taking on: November 24, 2019   torsemide 20 MG tablet Commonly known  as: DEMADEX Take 20 mg by mouth daily.   trolamine salicylate 10 % cream Commonly known as: ASPERCREME Apply 1 application topically 3 (three) times daily as needed for muscle pain.       If you experience worsening of your admission symptoms, develop shortness of breath, life threatening emergency, suicidal or homicidal thoughts you must seek medical attention immediately by calling 911 or calling your MD immediately  if symptoms less severe.  You Must read complete instructions/literature along with all the possible adverse reactions/side effects  for all the Medicines you take and that have been prescribed to you. Take any new Medicines after you have completely understood and accept all the possible adverse reactions/side effects.   Please note  You were cared for by a hospitalist during your hospital stay. If you have any questions about your discharge medications or the care you received while you were in the hospital after you are discharged, you can call the unit and asked to speak with the hospitalist on call if the hospitalist that took care of you is not available. Once you are discharged, your primary care physician will handle any further medical issues. Please note that NO REFILLS for any discharge medications will be authorized once you are discharged, as it is imperative that you return to your primary care physician (or establish a relationship with a primary care physician if you do not have one) for your aftercare needs so that they can reassess your need for medications and monitor your lab values. Today   SUBJECTIVE   Out in the recliner Not want breakfast today Slept well  VITAL SIGNS:  Blood pressure 133/77, pulse 78, temperature 97.7 F (36.5 C), resp. rate 16, height 5\' 7"  (1.702 m), weight 102.4 kg, SpO2 99 %.  I/O:    Intake/Output Summary (Last 24 hours) at 11/23/2019 1149 Last data filed at 11/23/2019 1001 Gross per 24 hour  Intake 240 ml  Output -  Net 240 ml    PHYSICAL EXAMINATION:  GENERAL:  79 y.o.-year-old patient lying in the bed with no acute distress. Obese EYES: Pupils equal, round, reactive to light and accommodation. No scleral icterus.  HEENT: Head atraumatic, normocephalic. Oropharynx and nasopharynx clear.  NECK:  Supple, no jugular venous distention. No thyroid enlargement, no tenderness.  LUNGS: Normal breath sounds bilaterally, no wheezing, rales,rhonchi or crepitation. No use of accessory muscles of respiration.  CARDIOVASCULAR: S1, S2 normal. No murmurs, rubs, or gallops.  ABDOMEN:  Soft, non-tender, non-distended. Bowel sounds present. No organomegaly or mass.  EXTREMITIES: chronic bilateral pedal edema, no cyanosis, or clubbing.  NEUROLOGIC: Cranial nerves II through XII are intact. Muscle strength 5/5 in all extremities. Sensation intact. Gait not checked.  PSYCHIATRIC: The patient is alert and awake.  SKIN: No obvious rash, lesion, or ulcer.   DATA REVIEW:   CBC  Recent Labs  Lab 11/22/19 0743  WBC 5.0  HGB 12.5  HCT 39.0  PLT 292    Chemistries  Recent Labs  Lab 11/21/19 0516 11/21/19 0516 11/22/19 0743  NA 143   < > 142  K 3.8   < > 3.7  CL 109   < > 107  CO2 27   < > 24  GLUCOSE 104*   < > 120*  BUN 21   < > 25*  CREATININE 0.99   < > 1.18*  CALCIUM 9.1   < > 9.6  MG 2.1  --   --    < > = values  in this interval not displayed.    Microbiology Results   No results found for this or any previous visit (from the past 240 hour(s)).  RADIOLOGY:  No results found.   CODE STATUS:     Code Status Orders  (From admission, onward)         Start     Ordered   11/06/19 1953  Full code  Continuous     11/06/19 1955        Code Status History    Date Active Date Inactive Code Status Order ID Comments User Context   11/03/2019 0913 11/06/2019 1955 Full Code [id number]  Blake Divine, MD ED   09/10/2019 1531 09/12/2019 2059 Full Code [id number]  Lorella Nimrod, MD ED   08/10/2017 2147 08/13/2017 2116 Full Code [id number]  Demetrios Loll, MD Inpatient   Advance Care Planning Activity       TOTAL TIME TAKING CARE OF THIS PATIENT: *40* minutes.    Fritzi Mandes M.D  [facility]    CC: Primary care physician; System, Pcp Not In

## 2020-06-29 ENCOUNTER — Telehealth: Payer: Self-pay

## 2020-06-29 NOTE — Telephone Encounter (Signed)
This telephone encounter was linked in error to the wrong patient's chart

## 2020-06-29 NOTE — Telephone Encounter (Signed)
Call at 12:17 left on triage voicemail.  Please call regarding graft in pt's leg.

## 2020-06-29 NOTE — Telephone Encounter (Signed)
Phone message linked to wrong chart.

## 2020-08-10 DIAGNOSIS — R001 Bradycardia, unspecified: Secondary | ICD-10-CM | POA: Insufficient documentation

## 2021-10-21 IMAGING — CT CT CERVICAL SPINE W/O CM
3 of 4 series · 13 of 33 positions shown, 16 images · non-contrast
Comparison: CT head 07/13/2019

CLINICAL DATA: Fall

EXAM:
CT HEAD WITHOUT CONTRAST
CT CERVICAL SPINE WITHOUT CONTRAST
TECHNIQUE: Multidetector CT imaging of the head and cervical spine was
performed following the standard protocol without intravenous
contrast. Multiplanar CT image reconstructions of the cervical spine
were also generated.

[Series 6: sagittal bone · sagittal · 0.34mm/px · 5 of 53 slices shown, 6 images]
[im 18/53  bone]
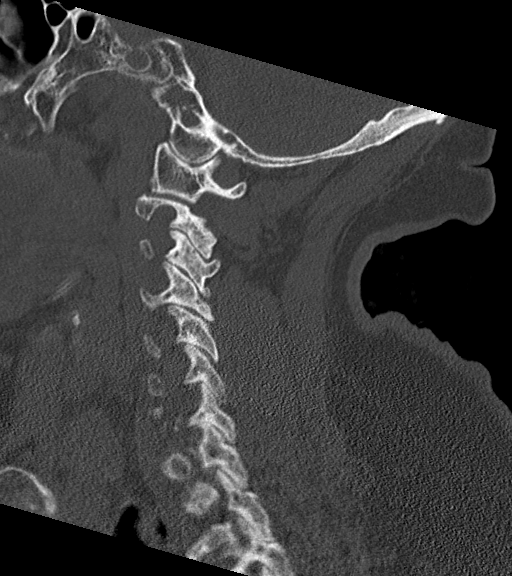
[im 22/53  bone]
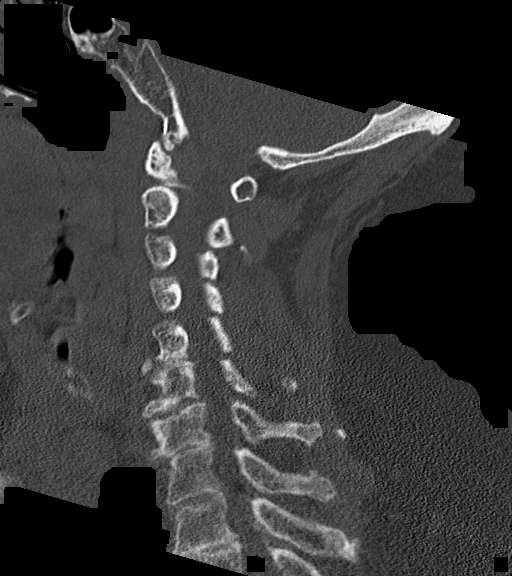
[im 27/53  soft-tissue]
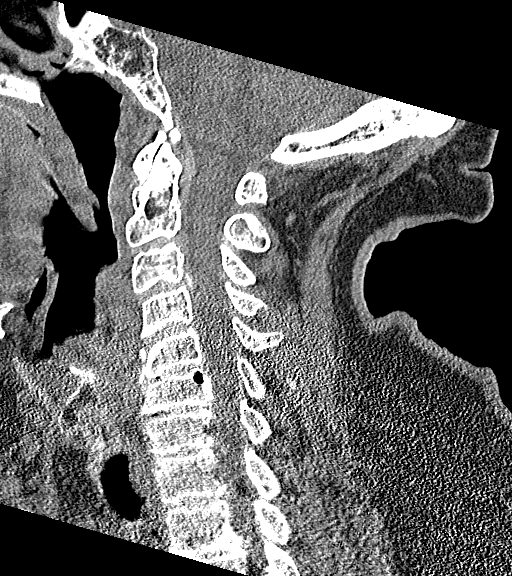
[im 27/53  bone]
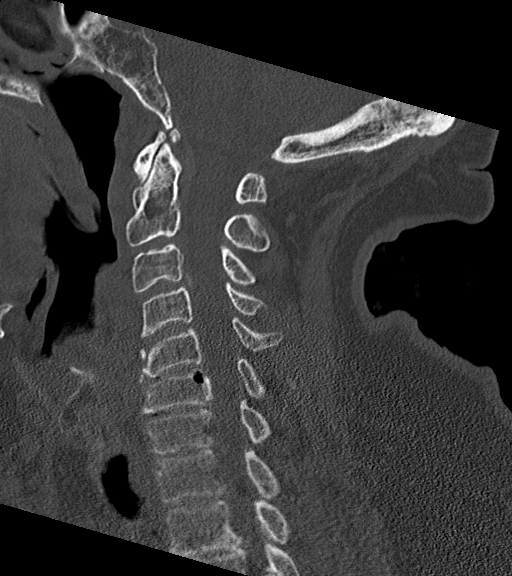
[im 31/53  bone]
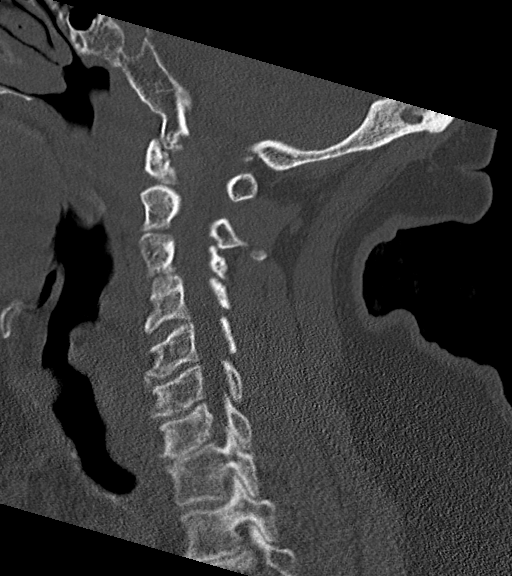
[im 35/53  bone]
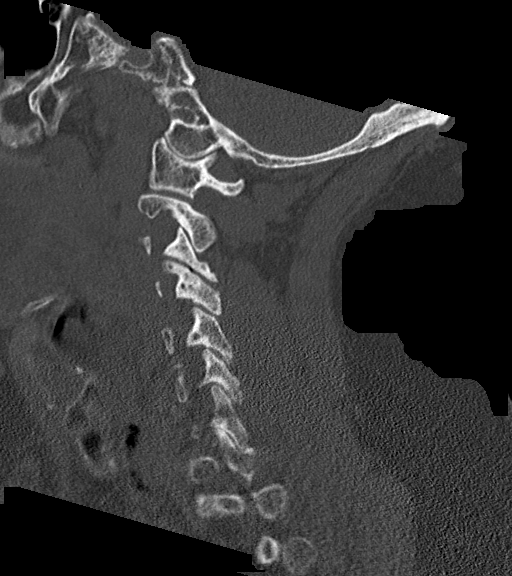

[Series 7: coronal bone · coronal · 0.25mm/px · 3 of 56 slices shown]
[im 12/56  bone]
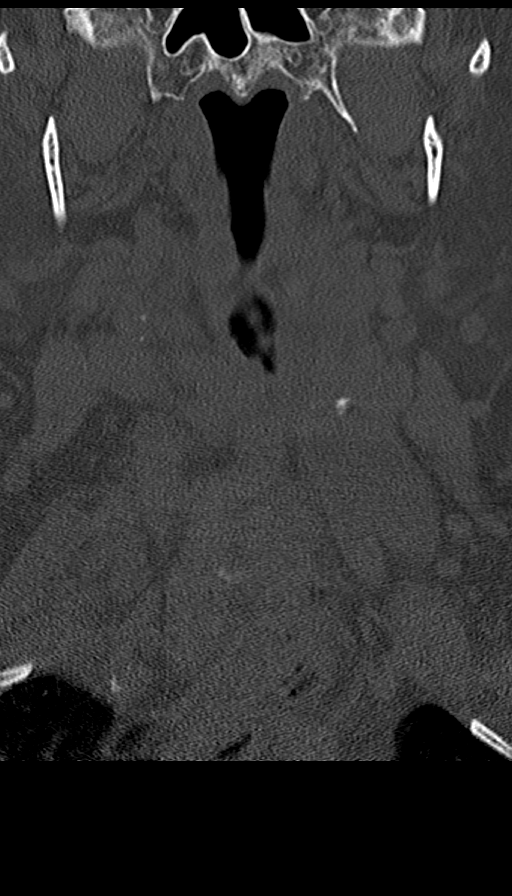
[im 23/56  bone]
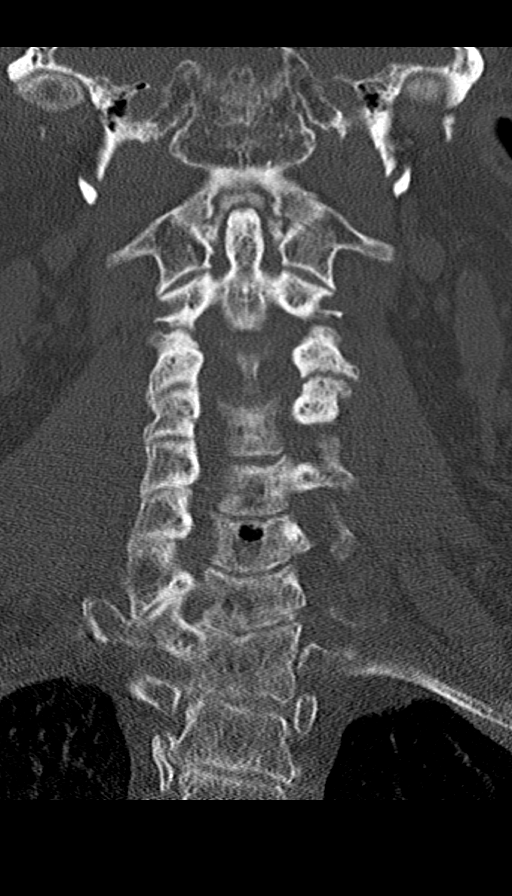
[im 34/56  bone]
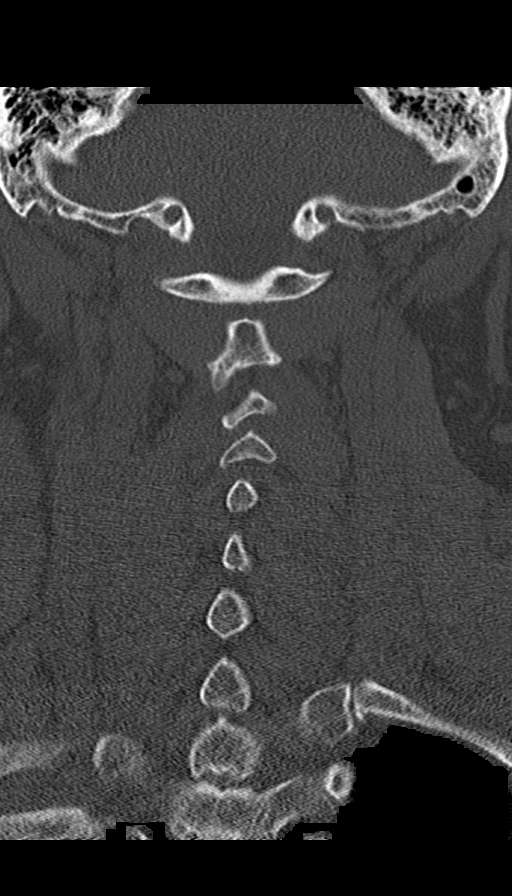

[Series 8: orthogonal bone · axial · 0.34mm/px · z∈[-267,-150]mm · 5 of 92 slices shown, 7 images]
[im 16/92  soft-tissue]
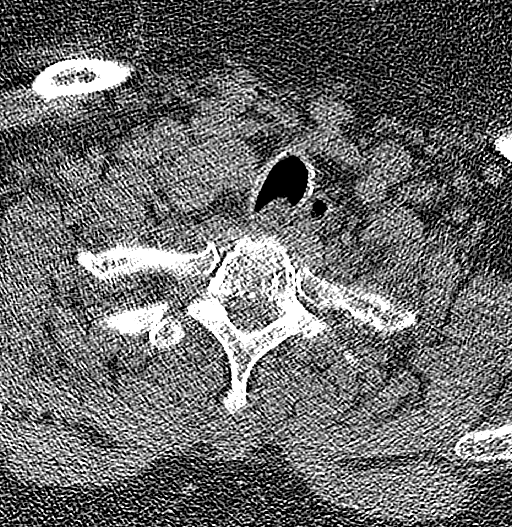
[im 16/92  bone]
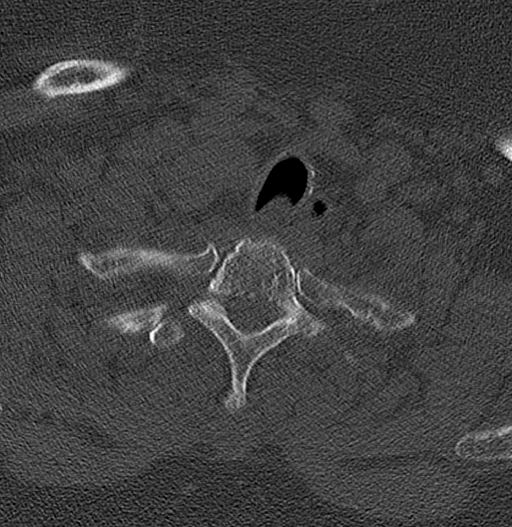
[im 31/92  bone]
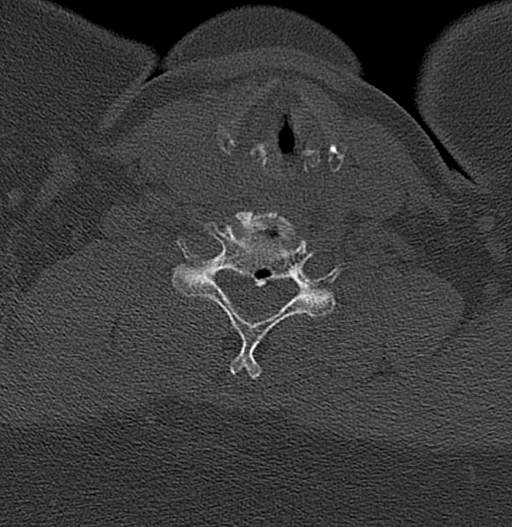
[im 46/92  bone]
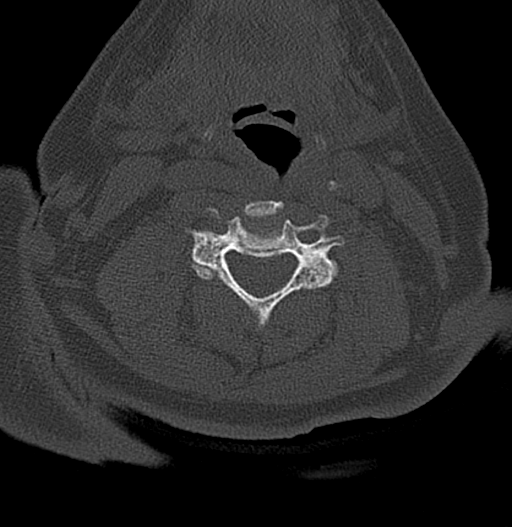
[im 61/92  bone]
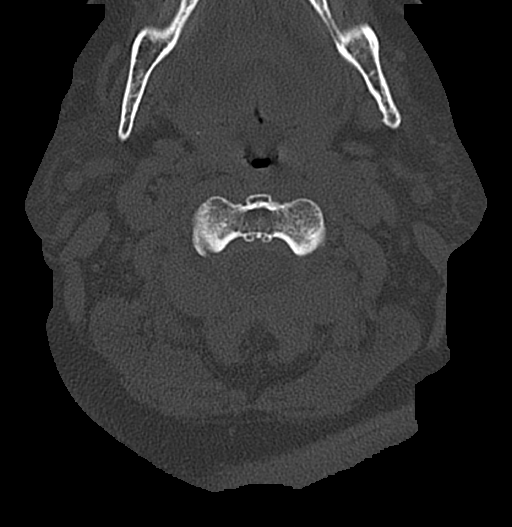
[im 76/92  soft-tissue]
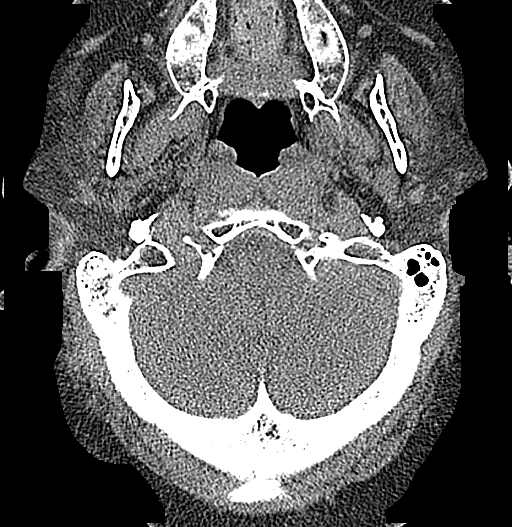
[im 76/92  bone]
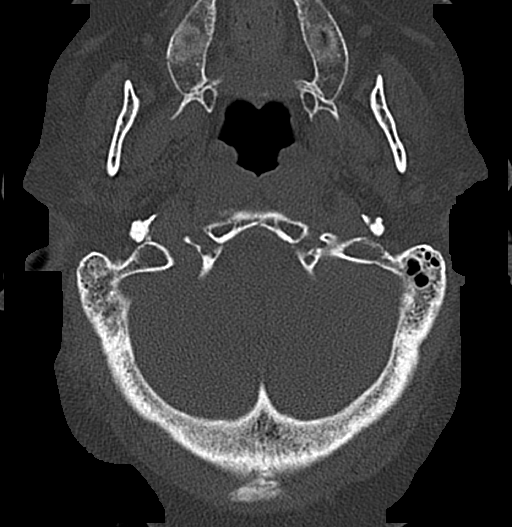

[13 of 33 positions shown; findings below may reference images not displayed]

FINDINGS: CT HEAD FINDINGS

Brain: No evidence of acute infarction, hemorrhage, hydrocephalus,
extra-axial collection or mass lesion/mass effect. Mild cerebral
atrophy.

Vascular: Negative for hyperdense vessel

Skull: Negative for skull fracture

Sinuses/Orbits: Negative

Other: None

CT CERVICAL SPINE FINDINGS

Alignment: Normal

Skull base and vertebrae: Negative for fracture

Soft tissues and spinal canal: Negative

Disc levels: Multilevel disc and facet degeneration. Disc
degeneration and spurring most prominent at C5-6 with mild spinal
stenosis

Upper chest: Lung apices clear bilaterally.

Other: None
IMPRESSION: No acute intracranial abnormality

Negative for cervical spine fracture.

## 2021-12-30 IMAGING — US US EXTREM LOW VENOUS
1 series · 13 of 24 positions shown · non-contrast
Comparison: None.

CLINICAL DATA: 78-year-old female with bilateral lower extremity
edema.



[Series 1: us extrem low venous · 0.08mm/px · 13 of 74 slices shown]
[im 1/74]
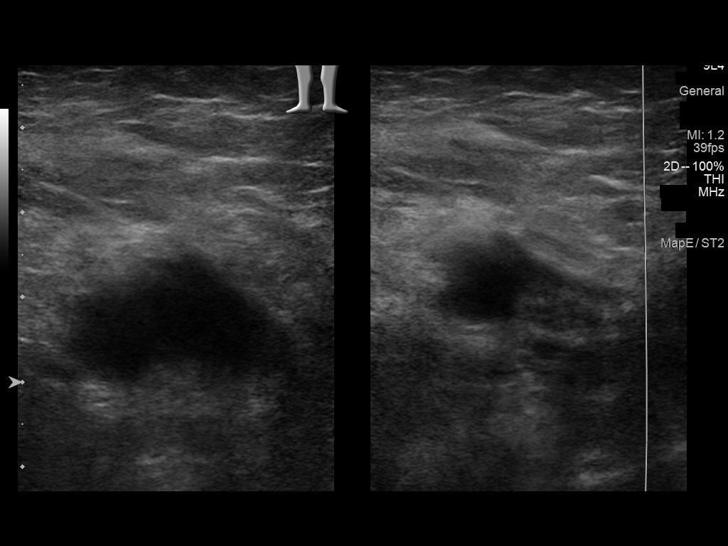
[im 7/74]
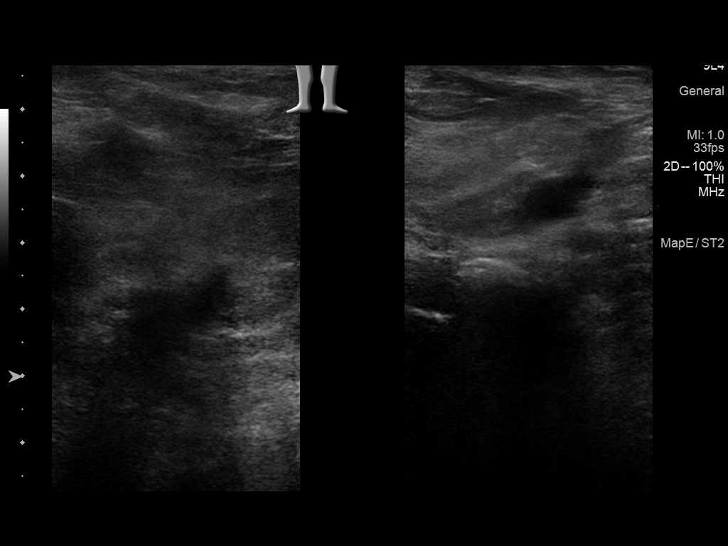
[im 13/74]
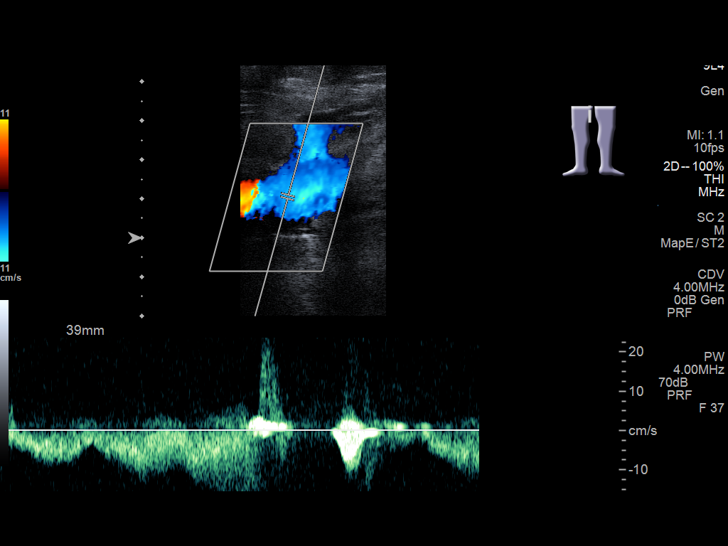
[im 20/74]
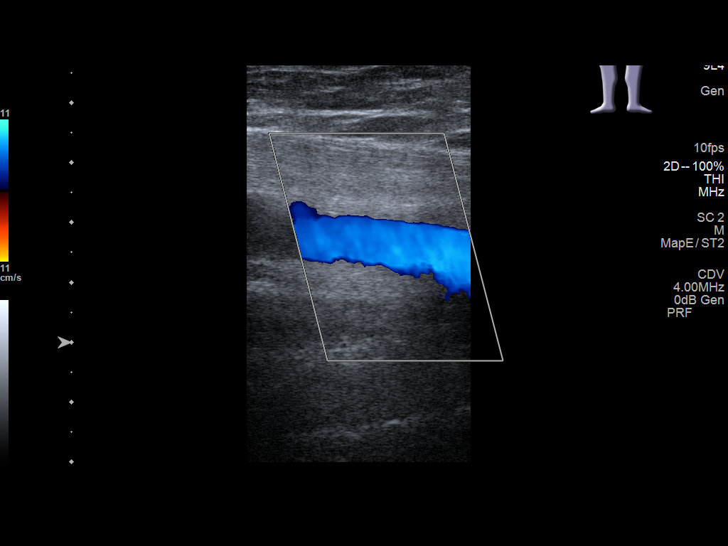
[im 26/74]
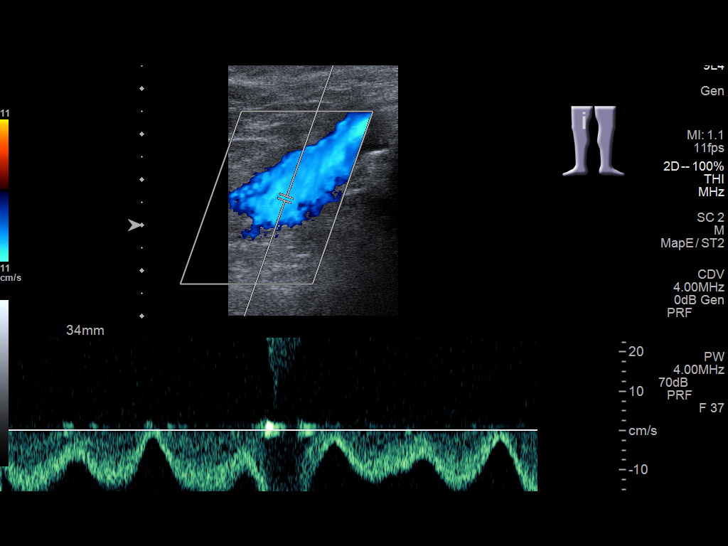
[im 32/74]
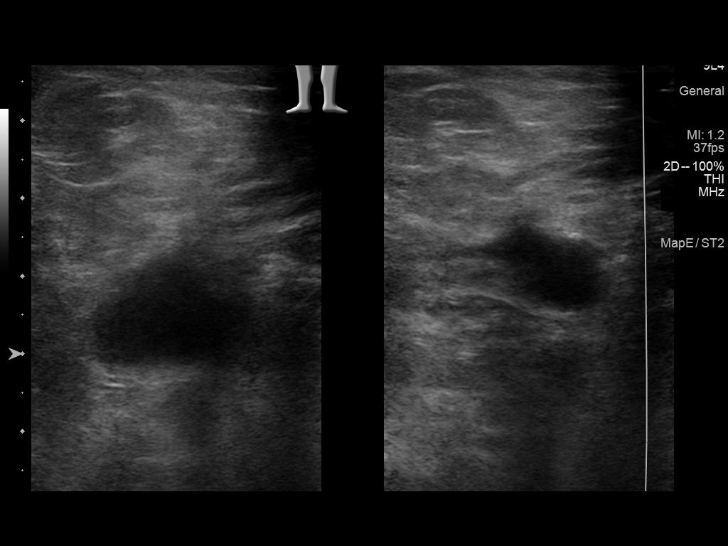
[im 39/74]
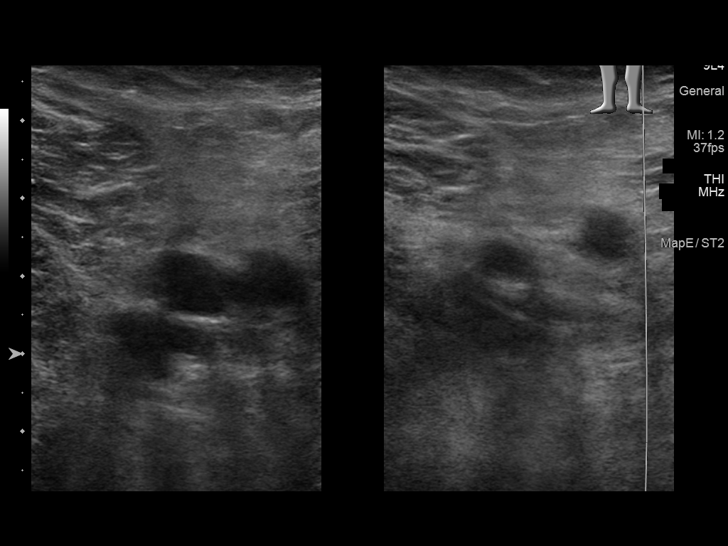
[im 42/74]
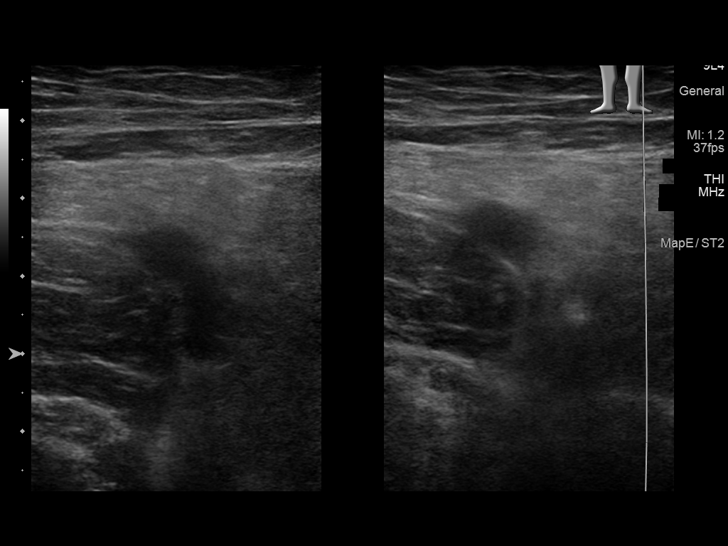
[im 48/74]
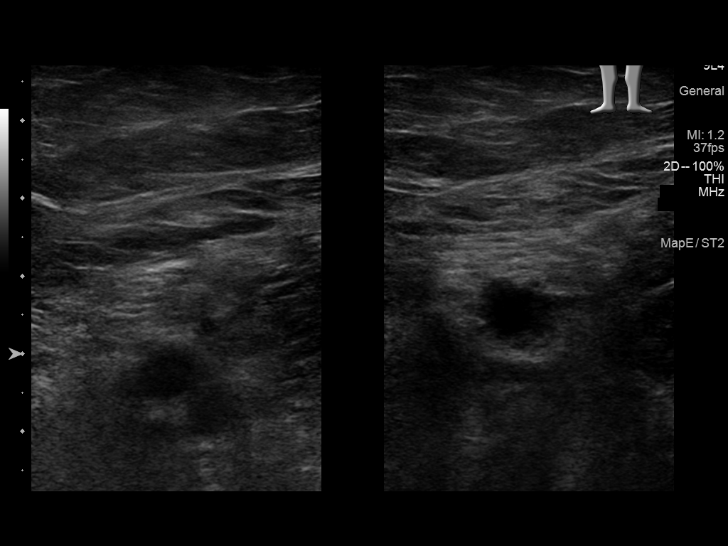
[im 54/74]
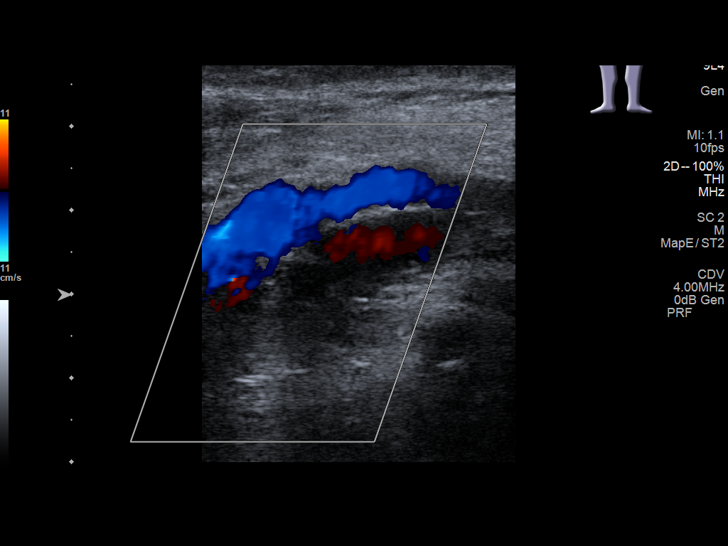
[im 61/74]
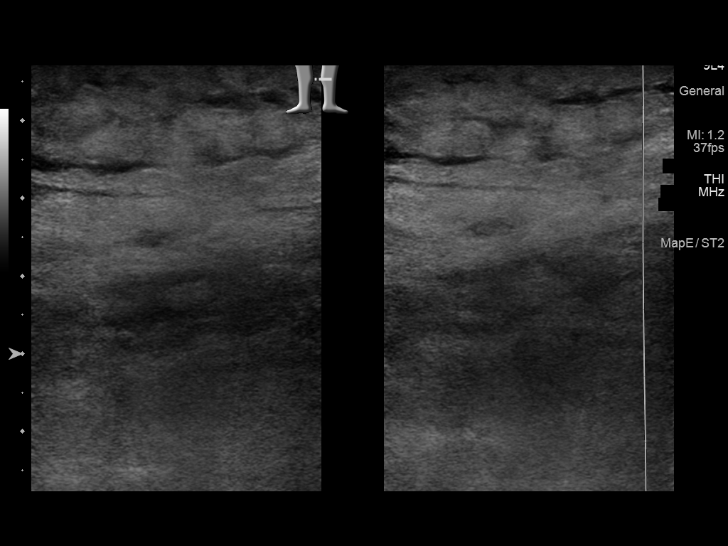
[im 67/74]
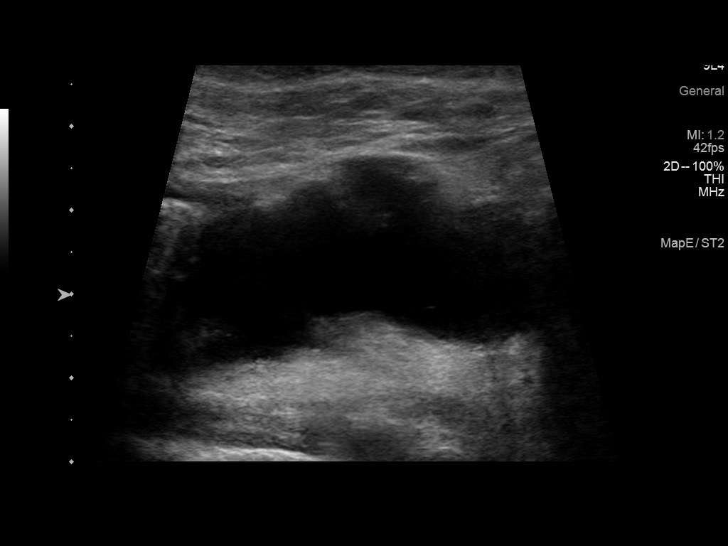
[im 74/74]
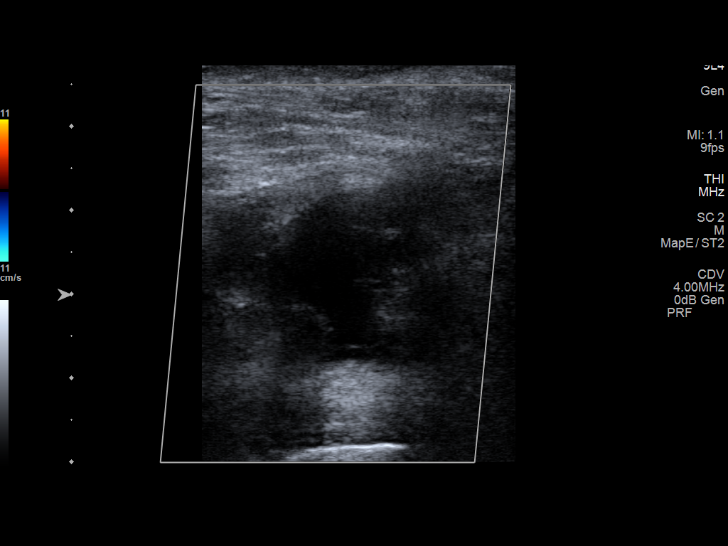

[13 of 24 positions shown; findings below may reference images not displayed]

FINDINGS: RIGHT LOWER EXTREMITY

Common Femoral Vein: No evidence of thrombus. Normal
compressibility, respiratory phasicity and response to augmentation.

Saphenofemoral Junction: No evidence of thrombus. Normal
compressibility and flow on color Doppler imaging.

Profunda Femoral Vein: No evidence of thrombus. Normal
compressibility and flow on color Doppler imaging.

Femoral Vein: No evidence of thrombus. Normal compressibility,
respiratory phasicity and response to augmentation.

Popliteal Vein: No evidence of thrombus. Normal compressibility,
respiratory phasicity and response to augmentation.

Calf Veins: No evidence of thrombus. Normal compressibility and flow
on color Doppler imaging.

Superficial Great Saphenous Vein: No evidence of thrombus. Normal
compressibility.

Venous Reflux:  None.

Other Findings:  None.

LEFT LOWER EXTREMITY

Common Femoral Vein: No evidence of thrombus. Normal
compressibility, respiratory phasicity and response to augmentation.

Saphenofemoral Junction: No evidence of thrombus. Normal
compressibility and flow on color Doppler imaging.

Profunda Femoral Vein: No evidence of thrombus. Normal
compressibility and flow on color Doppler imaging.

Femoral Vein: No evidence of thrombus. Normal compressibility,
respiratory phasicity and response to augmentation.

Popliteal Vein: No evidence of thrombus. Normal compressibility,
respiratory phasicity and response to augmentation.

Calf Veins: No evidence of thrombus. Normal compressibility and flow
on color Doppler imaging.

Superficial Great Saphenous Vein: No evidence of thrombus. Normal
compressibility.

Venous Reflux:  None.

Other Findings: Mildly complex hypoechoic collection with irregular
margins in the popliteal fossa measures 5.0 x 2.0 x 3.0 cm.
IMPRESSION: No evidence of deep venous thrombosis in either lower extremity.

Mildly complex Baker's cyst in the left popliteal fossa.

## 2023-01-06 DIAGNOSIS — G894 Chronic pain syndrome: Secondary | ICD-10-CM | POA: Insufficient documentation

## 2023-04-02 DIAGNOSIS — F0150 Vascular dementia without behavioral disturbance: Secondary | ICD-10-CM | POA: Insufficient documentation

## 2023-08-06 DIAGNOSIS — I5032 Chronic diastolic (congestive) heart failure: Secondary | ICD-10-CM | POA: Insufficient documentation

## 2023-12-07 NOTE — Progress Notes (Signed)
 Sleep Medicine   Office Visit  Patient Name: Melanie Young DOB: 1940/10/05 MRN [id number]    Chief Complaint: Sleep consult  Brief History:  Melanie Young presents for an initial consult for sleep evaluation and to establish care. Her caregiver is with her today. Patient has at least 2 year history of excessive daytime sleepiness. Sleep quality is poor. This is noted most night. The patient's bed partner reports snoring, gasping, choking and witnessed apneic pauses at night. The patient relates the following symptoms: dizziness, fatigue, trouble concentrating and brain fog are also present. The patient goes to sleep at 0800 pm and wakes up at 0500 am.  Sleep quality is worse when outside home environment.  Patient has noted no movement of her legs at night that would disrupt her sleep.  The patient  relates some nightmares, vivid dreaming and sleep talking as unusual behavior during the night.  The patient relates  depression and anxiety as a history of psychiatric problems. The Epworth Sleepiness Score is 18 out of 24 .  The patient relates  Cardiovascular risk factors include: CHF, HTN, bradycardia, s/p pacemaker. Pt is wheelchair bound and unable to step on scale in office today, but reports weekly weights at facility. She reports requiring full assistance with transfers.   ROS  General: (-) fever, (-) chills, (-) night sweat Nose and Sinuses: (-) nasal stuffiness or itchiness, (-) postnasal drip, (-) nosebleeds, (-) sinus trouble. Mouth and Throat: (-) sore throat, (-) hoarseness. Neck: (-) swollen glands, (-) enlarged thyroid , (-) neck pain. Respiratory: + cough, + shortness of breath, + wheezing. Neurologic: + numbness, + tingling. Psychiatric: + anxiety, + depression Sleep behavior: -sleep paralysis -hypnogogic hallucinations -dream enactment      +vivid dreams -cataplexy -night terrors -sleep walking   Current Medication: Outpatient Encounter Medications as of 12/08/2023  Medication  Sig   ENTRESTO 24-26 MG Take 1 tablet by mouth 2 (two) times daily.   FARXIGA 10 MG TABS tablet Take 10 mg by mouth daily.   gabapentin (NEURONTIN) 300 MG capsule Take 1 capsule by mouth 3 (three) times daily.   acetaminophen  (TYLENOL ) 325 MG tablet Take 650 mg by mouth every 4 (four) hours as needed for mild pain.   acetaminophen  (TYLENOL ) 650 MG CR tablet Take 650 mg by mouth 3 (three) times daily.   alum & mag hydroxide-simeth (MAALOX/MYLANTA) 200-200-20 MG/5ML suspension Take 30 mLs by mouth every 6 (six) hours as needed for indigestion or heartburn.   aspirin  81 MG tablet Take 1 tablet (81 mg total) by mouth daily.   atorvastatin  (LIPITOR) 40 MG tablet Take 40 mg by mouth at bedtime.    busPIRone  (BUSPAR ) 10 MG tablet    calcium  carbonate (TUMS - DOSED IN MG ELEMENTAL CALCIUM ) 500 MG chewable tablet Chew 1 tablet by mouth 2 (two) times daily.   cholecalciferol (VITAMIN D3) 25 MCG (1000 UNIT) tablet Take 1,000 Units by mouth daily.   diclofenac  Sodium (VOLTAREN ) 1 % GEL Apply 2 g topically 2 (two) times daily.   furosemide  (LASIX ) 20 MG tablet Take 20 mg by mouth daily.   GEMTESA 75 MG TABS Take by mouth.   guaifenesin (ROBITUSSIN) 100 MG/5ML syrup Take 200 mg by mouth every 6 (six) hours as needed for cough.   lamoTRIgine  (LAMICTAL ) 200 MG tablet Take 200 mg by mouth daily.    levothyroxine  (SYNTHROID ) 125 MCG tablet Take 125 mcg by mouth daily.   loperamide (IMODIUM) 2 MG capsule Take 2 mg by mouth 4 (four) times daily  as needed for diarrhea or loose stools.    magnesium hydroxide (MILK OF MAGNESIA) 400 MG/5ML suspension Take 30 mLs by mouth at bedtime as needed for mild constipation or moderate constipation.   neomycin-bacitracin-polymyxin (NEOSPORIN) ointment Apply 1 application topically as needed for wound care.   omeprazole (PRILOSEC) 20 MG capsule Take 20 mg by mouth daily.    ondansetron  (ZOFRAN ) 4 MG tablet Take 4 mg by mouth every 6 (six) hours as needed for nausea or vomiting.    PROAIR  HFA 108 (90 Base) MCG/ACT inhaler Inhale 1 puff into the lungs every 6 (six) hours as needed for wheezing or shortness of breath.    traMADol (ULTRAM) 50 MG tablet Take by mouth.   trolamine salicylate (ASPERCREME) 10 % cream Apply 1 application topically 3 (three) times daily as needed for muscle pain.    [DISCONTINUED] levothyroxine  (SYNTHROID ) 200 MCG tablet Take 1 tablet (200 mcg total) by mouth daily at 6 (six) AM.   [DISCONTINUED] losartan  (COZAAR ) 50 MG tablet Take 50 mg by mouth daily.   [DISCONTINUED] LYRICA  100 MG capsule Take 100 mg by mouth 2 (two) times daily.   [DISCONTINUED] oxybutynin  (DITROPAN ) 5 MG tablet Take 2.5 mg by mouth 2 (two) times daily.    [DISCONTINUED] oxyCODONE  (OXY IR/ROXICODONE ) 5 MG immediate release tablet Take 1 tablet (5 mg total) by mouth 2 (two) times daily.   [DISCONTINUED] QUEtiapine  (SEROQUEL ) 25 MG tablet Take 1 tablet (25 mg total) by mouth 2 (two) times daily for 30 doses.   [DISCONTINUED] sertraline  (ZOLOFT ) 50 MG tablet Take 1 tablet (50 mg total) by mouth daily.   [DISCONTINUED] torsemide  (DEMADEX ) 20 MG tablet Take 20 mg by mouth daily.   No facility-administered encounter medications on file as of 12/08/2023.    Surgical History: Past Surgical History:  Procedure Laterality Date   BREAST BIOPSY Left 2013   core needle bx, benign   BREAST SURGERY     CESAREAN SECTION     COLONOSCOPY WITH PROPOFOL  N/A 08/31/2018   Procedure: COLONOSCOPY WITH PROPOFOL ;  Surgeon: Toledo, Alphonsus Jeans, MD;  Location: [facility] ENDOSCOPY;  Service: Endoscopy;  Laterality: N/A;   MASTECTOMY Right 1973   breast ca   THYROIDECTOMY      Medical History: Past Medical History:  Diagnosis Date   Breast cancer (HCC) 1980's   right breast ca with mastectomy   Chronic kidney disease    Dementia (HCC)    Depression    Diabetes mellitus without complication (HCC)    Hypercholesteremia    Hypertension    Hypothyroidism    Osteoporosis     Family History: Non  contributory to the present illness  Social History: Social History   Socioeconomic History   Marital status: Single    Spouse name: Not on file   Number of children: Not on file   Years of education: Not on file   Highest education level: Not on file  Occupational History   Not on file  Tobacco Use   Smoking status: Never   Smokeless tobacco: Never  Vaping Use   Vaping status: Never Used  Substance and Sexual Activity   Alcohol use: Not Currently   Drug use: Never   Sexual activity: Not on file  Other Topics Concern   Not on file  Social History Narrative   Not on file   Social Drivers of Health   Financial Resource Strain: Not on file  Food Insecurity: Not on file  Transportation Needs: No Transportation Needs (05/08/2023)  Received from [facility] - Transportation    In the past 12 months, has lack of transportation kept you from medical appointments or from getting medications?: No    Lack of Transportation (Non-Medical): No  Physical Activity: Not on file  Stress: Not on file  Social Connections: Not on file  Intimate Partner Violence: Not on file    Vital Signs: Blood pressure 121/83, pulse 83, resp. rate 16, SpO2 96%. There is no height or weight on file to calculate BMI.   Examination: General Appearance: The patient is well-developed, well-nourished, and in no distress. Neck Circumference: 45 cm Skin: Gross inspection of skin unremarkable. Head: normocephalic, no gross deformities. Eyes: no gross deformities noted. ENT: ears appear grossly normal Neurologic: Alert and oriented. No involuntary movements.    STOP BANG RISK ASSESSMENT S (snore) Have you been told that you snore?     YES   T (tired) Are you often tired, fatigued, or sleepy during the day?   YES  O (obstruction) Do you stop breathing, choke, or gasp during sleep? YES   P (pressure) Do you have or are you being treated for high blood pressure? YES   B  (BMI) Is your body index greater than 35 kg/m? YES   A (age) Are you 81 years old or older? YES   N (neck) Do you have a neck circumference greater than 16 inches?   YES   G (gender) Are you a female? NO   TOTAL STOP/BANG "YES" ANSWERS 7                                                               A STOP-Bang score of 2 or less is considered low risk, and a score of 5 or more is high risk for having either moderate or severe OSA. For people who score 3 or 4, doctors may need to perform further assessment to determine how likely they are to have OSA.         EPWORTH SLEEPINESS SCALE:  Scale:  (0)= no chance of dozing; (1)= slight chance of dozing; (2)= moderate chance of dozing; (3)= high chance of dozing  Chance  Situtation    Sitting and reading: 3    Watching TV: 3    Sitting Inactive in public: 1    As a passenger in car: 3      Lying down to rest: 3    Sitting and talking: 0    Sitting quielty after lunch: 3    In a car, stopped in traffic: 2   TOTAL SCORE:   18 out of 24    SLEEP STUDIES:  None   LABS: No results found for this or any previous visit (from the past 2160 hours).  Radiology: DG Chest 2 View Result Date: 11/03/2019 CLINICAL DATA:  Chest pain, cough EXAM: CHEST - 2 VIEW COMPARISON:  10/31/2019 FINDINGS: Cardiomegaly. Probable mild interstitial edema, improved since prior study. Bibasilar atelectasis. No effusions or acute bony abnormality. IMPRESSION: Cardiomegaly with interstitial prominence, likely mild interstitial edema, improved since prior study. Bibasilar atelectasis. Electronically Signed   By: Janeece Mechanic M.D.   On: 11/03/2019 08:42    No results found.  No results found.    Assessment and Plan:  Patient Active Problem List   Diagnosis Date Noted   Chronic diastolic CHF (congestive heart failure), NYHA class 3 (HCC) 08/06/2023   Vascular dementia without behavioral disturbance (HCC) 04/02/2023   Chronic pain syndrome  01/06/2023   Bradycardia 08/10/2020   Major depressive disorder, recurrent episode, moderate (HCC) 11/12/2019   AKI (acute kidney injury) (HCC) 11/08/2019   Dementia with behavioral disturbance (HCC) 11/06/2019   Type 2 diabetes mellitus with hyperlipidemia (HCC) 11/06/2019   History of breast cancer 11/06/2019   Hypotension 11/06/2019   Essential hypertension 11/06/2019   Post-surgical hypothyroidism 11/06/2019   CKD (chronic kidney disease) stage 3, GFR 30-59 ml/min (HCC) 11/06/2019   Sinus bradycardia 09/10/2019   Fall    Dizziness    Urinary tract infection in elderly patient    Acute kidney injury (HCC) 08/10/2017     PLAN OSA:   Patient evaluation suggests high risk of sleep disordered breathing due to snoring, gasping, choking and witnessed apneic pauses, fatigue, trouble concentrating and brain fog.  Patient has comorbid cardiovascular risk factors including: CHF, HTN, and pacemaker which could be exacerbated by pathologic sleep-disordered breathing.  Suggest: PSG to assess/treat the patient's sleep disordered breathing. The patient was also counselled on wt loss to optimize sleep health.  1. Hypersomnia (Primary) Will order PSG  2. Chronic diastolic CHF (congestive heart failure), NYHA class 3 (HCC) Followed by cardiology  3. Essential hypertension Followed by cardiology  4. S/P placement of cardiac pacemaker Followed by cardiology  5. Dementia with behavioral disturbance [facility]) Caregiver present, living in [facility]  6. Chronic pain syndrome Continue current medication and f/u with PCP.  7. Post-surgical hypothyroidism Continue current medication and f/u with PCP.    General Counseling: I have discussed the findings of the evaluation and examination with Orelia Binet.  I have also discussed any further diagnostic evaluation thatmay be needed or ordered today. Genavieve verbalizes understanding of the findings of todays visit. We also reviewed her  medications today and discussed drug interactions and side effects including but not limited excessive drowsiness and altered mental states. We also discussed that there is always a risk not just to her but also people around her. she has been encouraged to call the office with any questions or concerns that should arise related to todays visit.  No orders of the defined types were placed in this encounter.       I have personally obtained a history, evaluated the patient, evaluated pertinent data, formulated the assessment and plan and placed orders.  This patient was seen by Taylor Favia, PA-C in collaboration with Dr. Cam Cava as a part of collaborative care agreement.    Cordie Deters, MD [facility] Diplomate ABMS Pulmonary and Critical Care Medicine Sleep medicine

## 2023-12-08 ENCOUNTER — Ambulatory Visit (INDEPENDENT_AMBULATORY_CARE_PROVIDER_SITE_OTHER): Admitting: Internal Medicine

## 2023-12-08 VITALS — BP 121/83 | HR 83 | Resp 16

## 2023-12-08 DIAGNOSIS — Z950 Presence of cardiac pacemaker: Secondary | ICD-10-CM

## 2023-12-08 DIAGNOSIS — G4710 Hypersomnia, unspecified: Secondary | ICD-10-CM

## 2023-12-08 DIAGNOSIS — F03918 Unspecified dementia, unspecified severity, with other behavioral disturbance: Secondary | ICD-10-CM

## 2023-12-08 DIAGNOSIS — I10 Essential (primary) hypertension: Secondary | ICD-10-CM

## 2023-12-08 DIAGNOSIS — I5032 Chronic diastolic (congestive) heart failure: Secondary | ICD-10-CM | POA: Diagnosis not present

## 2023-12-08 DIAGNOSIS — E890 Postprocedural hypothyroidism: Secondary | ICD-10-CM

## 2023-12-08 DIAGNOSIS — G894 Chronic pain syndrome: Secondary | ICD-10-CM
